# Patient Record
Sex: Female | Born: 1955 | Race: Black or African American | Hispanic: No | Marital: Single | Smoking: Never smoker
Health system: Southern US, Community
[De-identification: ages and names within clinical notes are randomized; demographics above are authoritative.]

## PROBLEM LIST (undated history)

## (undated) DIAGNOSIS — E119 Type 2 diabetes mellitus without complications: Secondary | ICD-10-CM

## (undated) DIAGNOSIS — I509 Heart failure, unspecified: Secondary | ICD-10-CM

## (undated) DIAGNOSIS — I2510 Atherosclerotic heart disease of native coronary artery without angina pectoris: Secondary | ICD-10-CM

## (undated) DIAGNOSIS — R0600 Dyspnea, unspecified: Secondary | ICD-10-CM

## (undated) DIAGNOSIS — I10 Essential (primary) hypertension: Secondary | ICD-10-CM

---

## 2014-02-01 ENCOUNTER — Encounter: Payer: Self-pay | Admitting: Internal Medicine

## 2016-04-18 ENCOUNTER — Encounter: Payer: Self-pay | Admitting: Internal Medicine

## 2016-05-21 HISTORY — PX: TOTAL KNEE ARTHROPLASTY: SHX125

## 2016-11-06 ENCOUNTER — Encounter: Payer: Self-pay | Admitting: Internal Medicine

## 2018-04-27 ENCOUNTER — Emergency Department (HOSPITAL_COMMUNITY): Payer: Self-pay

## 2018-04-27 ENCOUNTER — Encounter (HOSPITAL_COMMUNITY): Payer: Self-pay

## 2018-04-27 ENCOUNTER — Other Ambulatory Visit: Payer: Self-pay

## 2018-04-27 ENCOUNTER — Inpatient Hospital Stay (HOSPITAL_COMMUNITY)
Admission: EM | Admit: 2018-04-27 | Discharge: 2018-04-29 | DRG: 292 | Disposition: A | Discharge status: Home / Self Care | Payer: Self-pay | Attending: Internal Medicine | Admitting: Internal Medicine

## 2018-04-27 DIAGNOSIS — Z6841 Body Mass Index (BMI) 40.0 and over, adult: Secondary | ICD-10-CM

## 2018-04-27 DIAGNOSIS — I509 Heart failure, unspecified: Secondary | ICD-10-CM

## 2018-04-27 DIAGNOSIS — I5023 Acute on chronic systolic (congestive) heart failure: Secondary | ICD-10-CM | POA: Diagnosis present

## 2018-04-27 DIAGNOSIS — Z7982 Long term (current) use of aspirin: Secondary | ICD-10-CM

## 2018-04-27 DIAGNOSIS — Z9981 Dependence on supplemental oxygen: Secondary | ICD-10-CM

## 2018-04-27 DIAGNOSIS — I110 Hypertensive heart disease with heart failure: Principal | ICD-10-CM | POA: Diagnosis present

## 2018-04-27 DIAGNOSIS — Z79899 Other long term (current) drug therapy: Secondary | ICD-10-CM

## 2018-04-27 DIAGNOSIS — Z9119 Patient's noncompliance with other medical treatment and regimen: Secondary | ICD-10-CM

## 2018-04-27 DIAGNOSIS — I272 Pulmonary hypertension, unspecified: Secondary | ICD-10-CM | POA: Diagnosis present

## 2018-04-27 DIAGNOSIS — I472 Ventricular tachycardia: Secondary | ICD-10-CM | POA: Diagnosis present

## 2018-04-27 DIAGNOSIS — E119 Type 2 diabetes mellitus without complications: Secondary | ICD-10-CM | POA: Diagnosis present

## 2018-04-27 DIAGNOSIS — E876 Hypokalemia: Secondary | ICD-10-CM | POA: Diagnosis present

## 2018-04-27 HISTORY — DX: Heart failure, unspecified: I50.9

## 2018-04-27 HISTORY — DX: Type 2 diabetes mellitus without complications: E11.9

## 2018-04-27 HISTORY — DX: Essential (primary) hypertension: I10

## 2018-04-27 LAB — COMPREHENSIVE METABOLIC PANEL
ALK PHOS: 91 U/L (ref 38–126)
ALT: 59 U/L — AB (ref 0–44)
AST: 55 U/L — ABNORMAL HIGH (ref 15–41)
Albumin: 3.7 g/dL (ref 3.5–5.0)
Anion gap: 12 (ref 5–15)
BILIRUBIN TOTAL: 1 mg/dL (ref 0.3–1.2)
BUN: 21 mg/dL (ref 8–23)
CALCIUM: 9.4 mg/dL (ref 8.9–10.3)
CO2: 27 mmol/L (ref 22–32)
CREATININE: 0.97 mg/dL (ref 0.44–1.00)
Chloride: 104 mmol/L (ref 98–111)
Glucose, Bld: 193 mg/dL — ABNORMAL HIGH (ref 70–99)
Potassium: 4.2 mmol/L (ref 3.5–5.1)
Sodium: 143 mmol/L (ref 135–145)
TOTAL PROTEIN: 7.6 g/dL (ref 6.5–8.1)

## 2018-04-27 LAB — CBC
HEMATOCRIT: 37.8 % (ref 36.0–46.0)
HEMOGLOBIN: 11.6 g/dL — AB (ref 12.0–15.0)
MCH: 26.9 pg (ref 26.0–34.0)
MCHC: 30.7 g/dL (ref 30.0–36.0)
MCV: 87.7 fL (ref 80.0–100.0)
Platelets: 312 10*3/uL (ref 150–400)
RBC: 4.31 MIL/uL (ref 3.87–5.11)
RDW: 15.7 % — ABNORMAL HIGH (ref 11.5–15.5)
WBC: 7.4 10*3/uL (ref 4.0–10.5)
nRBC: 0 % (ref 0.0–0.2)

## 2018-04-27 LAB — LIPID PANEL
CHOL/HDL RATIO: 5.5 ratio
Cholesterol: 202 mg/dL — ABNORMAL HIGH (ref 0–200)
HDL: 37 mg/dL — AB (ref >40)
LDL CALC: 148 mg/dL — AB (ref 0–99)
Triglycerides: 84 mg/dL (ref <150)
VLDL: 17 mg/dL (ref 0–40)

## 2018-04-27 LAB — I-STAT TROPONIN, ED: TROPONIN I, POC: 0.02 ng/mL (ref 0.00–0.08)

## 2018-04-27 LAB — BRAIN NATRIURETIC PEPTIDE: B NATRIURETIC PEPTIDE 5: 516.6 pg/mL — AB (ref 0.0–100.0)

## 2018-04-27 LAB — TROPONIN I: TROPONIN I: 0.07 ng/mL — AB (ref <0.03)

## 2018-04-27 LAB — MAGNESIUM: MAGNESIUM: 2 mg/dL (ref 1.7–2.4)

## 2018-04-27 LAB — TSH: TSH: 1.927 u[IU]/mL (ref 0.350–4.500)

## 2018-04-27 LAB — GLUCOSE, CAPILLARY: Glucose-Capillary: 152 mg/dL — ABNORMAL HIGH (ref 70–99)

## 2018-04-27 MED ORDER — FUROSEMIDE 10 MG/ML IJ SOLN
40.0000 mg | Freq: Once | INTRAMUSCULAR | Status: AC
  Administered 2018-04-27: 40 mg via INTRAVENOUS
  Filled 2018-04-27: qty 4

## 2018-04-27 MED ORDER — ASPIRIN EC 325 MG PO TBEC
325.0000 mg | DELAYED_RELEASE_TABLET | Freq: Once | ORAL | Status: AC
  Administered 2018-04-27: 325 mg via ORAL
  Filled 2018-04-27: qty 1

## 2018-04-27 MED ORDER — ACETAMINOPHEN 325 MG PO TABS: 650.0000 mg | ORAL_TABLET | ORAL | Status: DC | PRN

## 2018-04-27 MED ORDER — NITROGLYCERIN IN D5W 200-5 MCG/ML-% IV SOLN
0.0000 ug/min | Freq: Once | INTRAVENOUS | Status: AC
  Administered 2018-04-27: 5 ug/min via INTRAVENOUS
  Filled 2018-04-27: qty 250

## 2018-04-27 MED ORDER — ONDANSETRON HCL 4 MG/2ML IJ SOLN: 4.0000 mg | Freq: Four times a day (QID) | INTRAMUSCULAR | Status: DC | PRN

## 2018-04-27 MED ORDER — ATORVASTATIN CALCIUM 40 MG PO TABS
40.0000 mg | ORAL_TABLET | Freq: Every day | ORAL | Status: DC
  Administered 2018-04-27 – 2018-04-28 (×2): 40 mg via ORAL
  Filled 2018-04-27 (×2): qty 1

## 2018-04-27 MED ORDER — ASPIRIN 81 MG PO CHEW
81.0000 mg | CHEWABLE_TABLET | Freq: Every day | ORAL | Status: DC
  Administered 2018-04-28 – 2018-04-29 (×2): 81 mg via ORAL
  Filled 2018-04-27 (×2): qty 1

## 2018-04-27 MED ORDER — ENOXAPARIN SODIUM 40 MG/0.4ML ~~LOC~~ SOLN
40.0000 mg | SUBCUTANEOUS | Status: DC
  Administered 2018-04-27: 40 mg via SUBCUTANEOUS
  Filled 2018-04-27: qty 0.4

## 2018-04-27 MED ORDER — INSULIN ASPART 100 UNIT/ML ~~LOC~~ SOLN
0.0000 [IU] | Freq: Three times a day (TID) | SUBCUTANEOUS | Status: DC
  Administered 2018-04-28: 7 [IU] via SUBCUTANEOUS
  Administered 2018-04-28: 4 [IU] via SUBCUTANEOUS

## 2018-04-27 MED ORDER — SODIUM CHLORIDE 0.9 % IV SOLN
250.0000 mL | INTRAVENOUS | Status: DC | PRN
  Administered 2018-04-28: 250 mL via INTRAVENOUS

## 2018-04-27 MED ORDER — SODIUM CHLORIDE 0.9% FLUSH
3.0000 mL | Freq: Two times a day (BID) | INTRAVENOUS | Status: DC
  Administered 2018-04-27 – 2018-04-28 (×3): 3 mL via INTRAVENOUS

## 2018-04-27 MED ORDER — ORAL CARE MOUTH RINSE
15.0000 mL | Freq: Two times a day (BID) | OROMUCOSAL | Status: DC
  Administered 2018-04-27 – 2018-04-28 (×2): 15 mL via OROMUCOSAL

## 2018-04-27 MED ORDER — SODIUM CHLORIDE 0.9% FLUSH: 3.0000 mL | INTRAVENOUS | Status: DC | PRN

## 2018-04-27 MED ORDER — LISINOPRIL 5 MG PO TABS
5.0000 mg | ORAL_TABLET | Freq: Every day | ORAL | Status: DC
  Administered 2018-04-27 – 2018-04-29 (×3): 5 mg via ORAL
  Filled 2018-04-27 (×3): qty 1

## 2018-04-27 NOTE — H&P (Addendum)
History and Physical    Tracey Gallegos IDP:[id number] DOB: 13-Jan-1956 DOA: 04/27/2018  PCP: Patient, No Pcp Per Patient coming from: Home  I have personally briefly reviewed patient's old medical records in [place]  Chief Complaint: Shortness of breath  HPI: Tracey Gallegos is a 62 y.o. female with medical history significant for hypertension and unspecified heart failure who presents to the ED with several weeks of shortness of breath and orthopnea.  Patient reports that she went to urgent care approximately 2 weeks ago, she was having exertional dyspnea, mild chest tightness and cough.  She was prescribed Augmentin without significant improvement in her symptoms.  Since that time, she has continued to have dyspnea with exertion, increasing lower extremity edema and abdominal distention, decreased urinary output and mild chest discomfort associated with exertion.  She is currently sleeping on 3 pillows, up from her usual one pillow.  She is not currently taking any medications.  She reports that she has been previously diagnosed with heart failure but was taken off of all her medications at some point in the distant past.  She denies fever, chills, night sweats.  She endorses weight gain, exertional dyspnea, chest tightness, lower extremity edema, abdominal distention.  Denies nausea, vomiting, diarrhea, dysuria.  ED Course: In the ED, patient afebrile, tachycardic into the low 100s, hypertensive, saturating in the upper 90s on 2 L submental oxygen.  Patient was given Lasix 40 mg IV with improvement in her symptoms.  Labs notable for BNP 516 (no prior for comparison) and initial troponin of 0.02.  Chest x-ray shows cardiomegaly with interstitial edema and bilateral pleural effusions.  EKG shows sinus tachycardia with a rate of 106, QTc 505, LVH criteria.  Review of Systems: As per HPI otherwise 10 point review of systems negative.   Past Medical History:  Diagnosis Date  . CHF (congestive heart  failure) ([place])   . Diabetes mellitus without complication ([place])   . Hypertension     History reviewed. No pertinent surgical history.   reports that she has never smoked. She has never used smokeless tobacco. She reports that she drinks alcohol. She reports that she does not use drugs.  No Known Allergies  No family history on file.  Prior to Admission medications   Medication Sig Start Date End Date Taking? Authorizing Provider  amoxicillin-clavulanate (AUGMENTIN) 875-125 MG tablet Take 1 tablet by mouth 2 (two) times daily.   Yes [provider]  Ascorbic Acid (VITAMIN C PO) Take by mouth.   Yes [provider]  ASPIRIN 81 PO Take 81 mg by mouth daily.   Yes [provider]  IRON PO Take by mouth.   Yes [provider]  Omega-3 Fatty Acids (FISH OIL PO) Take by mouth.   Yes [provider]    Physical Exam: Vitals:   04/27/18 1930 04/27/18 2000 04/27/18 2035 04/27/18 2036  BP: (!) 178/120 (!) 167/108 (!) 174/95   Pulse: 88 93 95   Resp: (!) 29 (!) 34 20   Temp:   98 F (36.7 C)   TempSrc:   Oral   SpO2: 96% 98% 94%   Weight:    127.7 kg  Height:    '5\' 8"'$  (1.727 m)    Constitutional: NAD, calm, comfortable Eyes: PERRL, lids and conjunctivae normal ENMT: Mucous membranes are moist. Posterior pharynx clear of any exudate or lesions. Neck: normal, supple, no masses, (+) JVP at 10 cmH2O Respiratory: decreased at the bases, inspiratory crackles in the  mid lung fields bilaterally Cardiovascular: Tachycardic, no murmurs / rubs / gallops. 3+ bilateral lower extremity edema to knees. 2+ pedal pulses. Abdomen: no tenderness, no masses palpated. Mildly distended. Bowel sounds positive.  Musculoskeletal: no clubbing / cyanosis. No joint deformity upper and lower extremities. Good ROM, no contractures. Normal muscle tone.  Skin: no rashes, lesions, ulcers. No induration Neurologic: CN 2-12 grossly intact. Sensation intact, DTR normal.  Strength 5/5 in all 4.  Psychiatric: Normal judgment and insight. Alert and oriented x 3. Normal mood.    Labs on Admission: I have personally reviewed following labs and imaging studies  CBC: Recent Labs  Lab 04/27/18 1613  WBC 7.4  HGB 11.6*  HCT 37.8  MCV 87.7  PLT 841   Basic Metabolic Panel: Recent Labs  Lab 04/27/18 1613 04/27/18 2111  NA 143  --   K 4.2  --   CL 104  --   CO2 27  --   GLUCOSE 193*  --   BUN 21  --   CREATININE 0.97  --   CALCIUM 9.4  --   MG  --  2.0   GFR: Estimated Creatinine Clearance: 83.8 mL/min (by C-G formula based on SCr of 0.97 mg/dL). Liver Function Tests: Recent Labs  Lab 04/27/18 1613  AST 55*  ALT 59*  ALKPHOS 91  BILITOT 1.0  PROT 7.6  ALBUMIN 3.7   No results for input(s): LIPASE, AMYLASE in the last 168 hours. No results for input(s): AMMONIA in the last 168 hours. Coagulation Profile: No results for input(s): INR, PROTIME in the last 168 hours. Cardiac Enzymes: Recent Labs  Lab 04/27/18 2111  TROPONINI 0.07*   BNP (last 3 results) No results for input(s): PROBNP in the last 8760 hours. HbA1C: No results for input(s): HGBA1C in the last 72 hours. CBG: Recent Labs  Lab 04/27/18 2045  GLUCAP 152*   Lipid Profile: Recent Labs    04/27/18 1513  CHOL 202*  HDL 37*  LDLCALC 148*  TRIG 84  CHOLHDL 5.5   Thyroid Function Tests: Recent Labs    04/27/18 1629  TSH 1.927   Anemia Panel: No results for input(s): VITAMINB12, FOLATE, FERRITIN, TIBC, IRON, RETICCTPCT in the last 72 hours. Urine analysis: No results found for: COLORURINE, APPEARANCEUR, LABSPEC, [place], GLUCOSEU, HGBUR, BILIRUBINUR, KETONESUR, PROTEINUR, UROBILINOGEN, NITRITE, LEUKOCYTESUR  Radiological Exams on Admission: Dg Chest Portable 1 View  Result Date: 04/27/2018 CLINICAL DATA:  Acute shortness of breath for several weeks. EXAM: PORTABLE CHEST 1 VIEW COMPARISON:  None. FINDINGS: Cardiomegaly with pulmonary vascular congestion  noted. Bilateral interstitial opacities likely represent interstitial edema. There may be trace bilateral pleural effusions present. No pneumothorax or acute bony abnormalities are noted. IMPRESSION: Cardiomegaly with interstitial opacities likely representing interstitial pulmonary edema. Possible trace bilateral pleural effusions. Electronically Signed   By: Margarette Canada M.D.   On: 04/27/2018 16:44    EKG: Independently reviewed. Sinus tachycardia, rate 106. LVH criteria met. QTc 505.  Assessment/Plan Active Problems:   Acute exacerbation of CHF (congestive heart failure) (HCC)  Meeghan Skipper is a 62 y.o. female with above medical history admitted for acute CHF exacerbation.  Suspect progression of previously noted heart failure in the setting of medication nonadherence.  Additionally, would consider long-standing, poorly controlled hypertension as a possible etiology.  If EF is newly depressed, patient would benefit from ischemic eval not emergently.  Viral myocarditis is on differential but felt to be less likely as symptoms seem to be progressive.  Acute heart failure  exacerbation, unspecified heart failure type Hypertension - Continue diuresis with IV Lasix for gross volume overload - Obtain TTE - Monitor on telemetry - Trend troponins to peak - Risk stratification labs: A1c, FLP, TSH - Start statin, ASA - Avoid BB in acute setting of heart failure - Will start lisinopril for hypertension - Daily BMP, Mg; monitor lytes in setting of diuresis - Strict I/O  DVT prophylaxis: Lovenox Code Status: Full Disposition Plan: Home in 1-2 days Consults called: None Admission status: Telemetry inpatient   Paizleigh Wilds Sharene Butters MD [facility]  If 7PM-7AM, please contact night-coverage [email or web address] Password [email or web address]  04/27/2018, 10:11 PM

## 2018-04-27 NOTE — ED Provider Notes (Signed)
[facility] DEPT Provider Note   CSN: [id number] Arrival date & time: 04/27/18  1521     History   Chief Complaint Chief Complaint  Patient presents with  . Shortness of Breath    HPI Tracey Gallegos is a 62 y.o. female with history of congestive heart failure-unknown ejection fraction, diabetes mellitus, hypertension, recently diagnosed bronchitis presenting for evaluation of shortness of breath.  For the past week 2-3 weeks, she has been having increasing dyspnea on exertion for, orthopnea, paroxysmal nocturnal dyspnea and lower extremity edema.  She also reports of  intermittent chest pressure which she attributed to bronchitis.  Currently she denies any dizziness, lightheadedness, nausea, vomiting, fevers or chills.  She has not followed with primary care doctor in over a year and currently does not take any medications.    Past Medical History:  Diagnosis Date  . CHF (congestive heart failure) ([place])   . Diabetes mellitus without complication ([place])   . Hypertension     There are no active problems to display for this patient.   History reviewed. No pertinent surgical history.   OB History   None      Home Medications    Prior to Admission medications   Medication Sig Start Date End Date Taking? Authorizing Provider  Ascorbic Acid (VITAMIN C PO) Take by mouth.   Yes [provider]  ASPIRIN 81 PO Take 81 mg by mouth daily.   Yes [provider]  IRON PO Take by mouth.   Yes [provider]  Omega-3 Fatty Acids (FISH OIL PO) Take by mouth.   Yes [provider]    Family History No family history on file.  Social History Social History   Tobacco Use  . Smoking status: Never Smoker  . Smokeless tobacco: Never Used  Substance Use Topics  . Alcohol use: Yes    Comment: occasionally  . Drug use: Never     Allergies   Patient has no known allergies.   Review of Systems Review of Systems    Constitutional: Negative.   HENT: Negative.   Eyes: Negative.   Respiratory: Positive for cough and shortness of breath.   Cardiovascular: Positive for leg swelling. Negative for palpitations. Chest pain: intermittent chest pressure   Gastrointestinal: Negative.   Musculoskeletal: Negative.   Skin: Negative.   Neurological: Negative.   Psychiatric/Behavioral: Negative.      Physical Exam Updated Vital Signs BP (!) 164/104   Pulse (!) 102   Temp 98.5 F (36.9 C) (Oral)   Resp (!) 23   Ht 5\' 8"  (1.727 m)   Wt 128.8 kg   SpO2 95%   BMI 43.18 kg/m   Physical Exam  Constitutional: She appears well-developed and well-nourished.  Non-toxic appearance. Distressed: due to SOB.  HENT:  Head: Normocephalic and atraumatic.  Cardiovascular: Normal rate and regular rhythm. Exam reveals no gallop.  No murmur heard. Pulmonary/Chest: Tachypnea noted. She is in respiratory distress. She has no decreased breath sounds. She has no wheezes. She has no rhonchi. She has rales in the right lower field and the left lower field.  Abdominal: Soft. Bowel sounds are normal.  Musculoskeletal:       Right lower leg: She exhibits edema.       Left lower leg: She exhibits edema.  Neurological: She is alert.  Skin: Skin is warm.  Psychiatric: She has a normal mood and affect. Her behavior is normal.     ED Treatments / Results  Labs (all labs ordered are listed, but only abnormal results are displayed) Labs Reviewed  CBC - Abnormal; Notable for the following components:      Result Value   Hemoglobin 11.6 (*)    RDW 15.7 (*)    All other components within normal limits  BRAIN NATRIURETIC PEPTIDE - Abnormal; Notable for the following components:   B Natriuretic Peptide 516.6 (*)    All other components within normal limits  COMPREHENSIVE METABOLIC PANEL - Abnormal; Notable for the following components:   Glucose, Bld 193 (*)    AST 55 (*)    ALT 59 (*)    All other components within normal  limits  I-STAT TROPONIN, ED  I-STAT TROPONIN, ED    EKG EKG Interpretation  Date/Time:  Tuesday April 27 2018 15:54:59 EDT Ventricular Rate:  106 PR Interval:    QRS Duration: 111 QT Interval:  380 QTC Calculation: 505 R Axis:   -26 Text Interpretation:  Sinus tachycardia Probable left ventricular hypertrophy Prolonged QT interval Confirmed by Lacretia Leigh ([id number]) on 04/27/2018 4:11:31 PM   Radiology Dg Chest Portable 1 View  Result Date: 04/27/2018 CLINICAL DATA:  Acute shortness of breath for several weeks. EXAM: PORTABLE CHEST 1 VIEW COMPARISON:  None. FINDINGS: Cardiomegaly with pulmonary vascular congestion noted. Bilateral interstitial opacities likely represent interstitial edema. There may be trace bilateral pleural effusions present. No pneumothorax or acute bony abnormalities are noted. IMPRESSION: Cardiomegaly with interstitial opacities likely representing interstitial pulmonary edema. Possible trace bilateral pleural effusions. Electronically Signed   By: Margarette Canada M.D.   On: 04/27/2018 16:44    Procedures Procedures (including critical care time)  Medications Ordered in ED Medications  nitroGLYCERIN 50 mg in dextrose 5 % 250 mL (0.2 mg/mL) infusion (5 mcg/min Intravenous Rate/Dose Verify 04/27/18 1743)  furosemide (LASIX) injection 40 mg (40 mg Intravenous Given 04/27/18 1650)     Initial Impression / Assessment and Plan / ED Course  I have reviewed the triage vital signs and the nursing notes.  Pertinent labs & imaging results that were available during my care of the patient were reviewed by me and considered in my medical decision making (see chart for details).   62 year old woman with hypertension, diabetes, congestive heart failure diagnosed in 2006 and noncompliance to clinic visits she reports of not seeing her PCP in over a year presenting with dyspnea on exertion, orthopnea, paroxysmal nocturnal dyspnea and lower extremity edema.  Physical exams  noticeable for decreased airflow, bibasilar crackles, 2+ lower extremity edema up to the ankle.  Also noted to have elevated BP at 204/110, tachycardia with heart rate of 108 and tachypnea with RR of 36.  Chest x-ray did reveal interstitial pulmonary edema with trace bilateral pleural effusions. BNP was elevated at 516, i-STAT troponin was negative.  She was given one-time dose of IV Lasix 40 mg and was started on nitroglycerin infusion for BP control.  On reevaluation she reported of slightly improved symptoms.  Her BP had also improved to 164/104.  She will be admitted to the hospitalist service for management of congestive heart failure exacerbation.   Final Clinical Impressions(s) / ED Diagnoses   Final diagnoses:  Acute on chronic congestive heart failure, unspecified heart failure type [facility])    ED Discharge Orders    None       Jean Rosenthal, MD 04/27/18 1755    Lacretia Leigh, MD 04/28/18 1346

## 2018-04-27 NOTE — ED Notes (Signed)
Bed: [facility] Expected date:  Expected time:  Means of arrival:  Comments: EMS-SOB-room 25

## 2018-04-27 NOTE — ED Notes (Signed)
ED TO INPATIENT HANDOFF REPORT  Name/Age/Gender Tracey Gallegos 62 y.o. female  Code Status   Home/SNF/Other Home  Chief Complaint short of breath   Level of Care/Admitting Diagnosis ED Disposition    ED Disposition Condition Comment   Admit  The patient appears reasonably stabilized for admission considering the current resources, flow, and capabilities available in the ED at this time, and I doubt any other [facility] requiring further screening and/or treatment in the ED prior to admission is  present.       Medical History Past Medical History:  Diagnosis Date  . CHF (congestive heart failure) ([place])   . Diabetes mellitus without complication ([place])   . Hypertension     Allergies No Known Allergies  IV Location/Drains/Wounds Patient Lines/Drains/Airways Status   Active Line/Drains/Airways    Name:   Placement date:   Placement time:   Site:   Days:   Peripheral IV 04/27/18 Left Antecubital   04/27/18    1652    Antecubital   less than 1   Peripheral IV 04/27/18 Left Forearm   04/27/18    1812    Forearm   less than 1          Labs/Imaging Results for orders placed or performed during the hospital encounter of 04/27/18 (from the past 48 hour(s))  CBC     Status: Abnormal   Collection Time: 04/27/18  4:13 PM  Result Value Ref Range   WBC 7.4 4.0 - 10.5 K/uL   RBC 4.31 3.87 - 5.11 MIL/uL   Hemoglobin 11.6 (L) 12.0 - 15.0 g/dL   HCT 37.8 36.0 - 46.0 %   MCV 87.7 80.0 - 100.0 fL   MCH 26.9 26.0 - 34.0 pg   MCHC 30.7 30.0 - 36.0 g/dL   RDW 15.7 (H) 11.5 - 15.5 %   Platelets 312 150 - 400 K/uL   nRBC 0.0 0.0 - 0.2 %    Comment: Performed at [facility], [place] [address], [place], [place] [address]  Brain natriuretic peptide     Status: Abnormal   Collection Time: 04/27/18  4:13 PM  Result Value Ref Range   B Natriuretic Peptide 516.6 (H) 0.0 - 100.0 pg/mL    Comment: Performed at [facility], [place] [address] [place], [place]  [address]  Comprehensive metabolic panel     Status: Abnormal   Collection Time: 04/27/18  4:13 PM  Result Value Ref Range   Sodium 143 135 - 145 mmol/L   Potassium 4.2 3.5 - 5.1 mmol/L   Chloride 104 98 - 111 mmol/L   CO2 27 22 - 32 mmol/L   Glucose, Bld 193 (H) 70 - 99 mg/dL   BUN 21 8 - 23 mg/dL   Creatinine, Ser 0.97 0.44 - 1.00 mg/dL   Calcium 9.4 8.9 - 10.3 mg/dL   Total Protein 7.6 6.5 - 8.1 g/dL   Albumin 3.7 3.5 - 5.0 g/dL   AST 55 (H) 15 - 41 U/L   ALT 59 (H) 0 - 44 U/L   Alkaline Phosphatase 91 38 - 126 U/L   Total Bilirubin 1.0 0.3 - 1.2 mg/dL   GFR calc non Af Amer >60 >60 mL/min   GFR calc Af Amer >60 >60 mL/min    Comment: (NOTE) The eGFR has been calculated using the CKD EPI equation. This calculation has not been validated in all clinical situations. eGFR's persistently <60 mL/min signify possible Chronic Kidney Disease.    Anion gap  12 5 - 15    Comment: Performed at [facility], [place] [address], [place], [place] [address]  I-Stat Troponin, ED (not at [facility])     Status: None   Collection Time: 04/27/18  4:18 PM  Result Value Ref Range   Troponin i, poc 0.02 0.00 - 0.08 ng/mL   Comment 3            Comment: Due to the release kinetics of cTnI, a negative result within the first hours of the onset of symptoms does not rule out myocardial infarction with certainty. If myocardial infarction is still suspected, repeat the test at appropriate intervals.    Dg Chest Portable 1 View  Result Date: 04/27/2018 CLINICAL DATA:  Acute shortness of breath for several weeks. EXAM: PORTABLE CHEST 1 VIEW COMPARISON:  None. FINDINGS: Cardiomegaly with pulmonary vascular congestion noted. Bilateral interstitial opacities likely represent interstitial edema. There may be trace bilateral pleural effusions present. No pneumothorax or acute bony abnormalities are noted. IMPRESSION: Cardiomegaly with interstitial opacities likely representing interstitial pulmonary  edema. Possible trace bilateral pleural effusions. Electronically Signed   By: Margarette Canada M.D.   On: 04/27/2018 16:44    Pending Labs Unresulted Labs (From admission, onward)   None      Vitals/Pain Today's Vitals   04/27/18 1630 04/27/18 1705 04/27/18 1741 04/27/18 1800  BP: (!) 174/111 (!) 158/98 (!) 164/104 (!) 182/140  Pulse: (!) 102 96 (!) 102 98  Resp: (!) 33 (!) 32 (!) 23 18  Temp:      TempSrc:      SpO2: 93% 96% 95% 94%  Weight:      Height:      PainSc:        Isolation Precautions No active isolations  Medications Medications  nitroGLYCERIN 50 mg in dextrose 5 % 250 mL (0.2 mg/mL) infusion (10 mcg/min Intravenous Rate/Dose Verify 04/27/18 1815)  furosemide (LASIX) injection 40 mg (40 mg Intravenous Given 04/27/18 1650)    Mobility walks

## 2018-04-27 NOTE — Progress Notes (Signed)
CRITICAL VALUE ALERT  Critical Value:  Troponin 0.07  Date & Time Notied:  04/27/18 2200  Provider Notified:Kirby, NP  Orders Received/Actions taken:

## 2018-04-27 NOTE — ED Provider Notes (Addendum)
I saw and evaluated the patient, reviewed the resident's note and I agree with the findings and plan.  EKG: EKG Interpretation  Date/Time:  Tuesday April 27 2018 15:54:59 EDT Ventricular Rate:  106 PR Interval:    QRS Duration: 111 QT Interval:  380 QTC Calculation: 505 R Axis:   -26 Text Interpretation:  Sinus tachycardia Probable left ventricular hypertrophy Prolonged QT interval Confirmed by Lacretia Leigh ([id number]) on 04/27/2018 4:34:54 PM 62 year old female here with shortness of breath times several weeks.  She endorses symptoms consistent with CHF.  On exam she does have rales in her bases.  Chest x-ray pending at this time.  Patient likely to require admission  Pt improved after iv lasix and nitro drip  Will be admitted to the medicine service  CRITICAL CARE Performed by: Leota Jacobsen Total critical care time: 55 minutes Critical care time was exclusive of separately billable procedures and treating other patients. Critical care was necessary to treat or prevent imminent or life-threatening deterioration. Critical care was time spent personally by me on the following activities: development of treatment plan with patient and/or surrogate as well as nursing, discussions with consultants, evaluation of patient's response to treatment, examination of patient, obtaining history from patient or surrogate, ordering and performing treatments and interventions, ordering and review of laboratory studies, ordering and review of radiographic studies, pulse oximetry and re-evaluation of patient's condition.    Lacretia Leigh, MD 04/27/18 1631    Lacretia Leigh, MD 05/13/18 1240

## 2018-04-27 NOTE — ED Triage Notes (Signed)
Pt BIBA from home. Pt c/o shortness of breath for several weeks. Pt was dx with bronchitis 2 weeks ago. She took 10 days of abx without relief. At her PCP, the MD noted sats in the upper 80s/lower 90s.

## 2018-04-28 ENCOUNTER — Inpatient Hospital Stay (HOSPITAL_COMMUNITY): Payer: Self-pay

## 2018-04-28 ENCOUNTER — Encounter (HOSPITAL_COMMUNITY): Payer: Self-pay

## 2018-04-28 DIAGNOSIS — I5021 Acute systolic (congestive) heart failure: Secondary | ICD-10-CM

## 2018-04-28 DIAGNOSIS — I509 Heart failure, unspecified: Secondary | ICD-10-CM

## 2018-04-28 DIAGNOSIS — I340 Nonrheumatic mitral (valve) insufficiency: Secondary | ICD-10-CM

## 2018-04-28 LAB — BASIC METABOLIC PANEL
ANION GAP: 13 (ref 5–15)
BUN: 18 mg/dL (ref 8–23)
CALCIUM: 9.4 mg/dL (ref 8.9–10.3)
CHLORIDE: 104 mmol/L (ref 98–111)
CO2: 31 mmol/L (ref 22–32)
Creatinine, Ser: 1.1 mg/dL — ABNORMAL HIGH (ref 0.44–1.00)
GFR calc Af Amer: >60 mL/min (ref >60)
GFR calc non Af Amer: 52 mL/min — ABNORMAL LOW (ref >60)
GLUCOSE: 179 mg/dL — AB (ref 70–99)
Potassium: 3.6 mmol/L (ref 3.5–5.1)
Sodium: 148 mmol/L — ABNORMAL HIGH (ref 135–145)

## 2018-04-28 LAB — HEMOGLOBIN A1C
Hgb A1c MFr Bld: 7.8 % — ABNORMAL HIGH (ref 4.8–5.6)
Mean Plasma Glucose: 177.16 mg/dL

## 2018-04-28 LAB — TROPONIN I: TROPONIN I: 0.06 ng/mL — AB (ref <0.03)

## 2018-04-28 LAB — ECHOCARDIOGRAM COMPLETE
HEIGHTINCHES: 68 in
Weight: 4438.4 oz

## 2018-04-28 LAB — GLUCOSE, CAPILLARY
GLUCOSE-CAPILLARY: 131 mg/dL — AB (ref 70–99)
Glucose-Capillary: 122 mg/dL — ABNORMAL HIGH (ref 70–99)
Glucose-Capillary: 161 mg/dL — ABNORMAL HIGH (ref 70–99)
Glucose-Capillary: 214 mg/dL — ABNORMAL HIGH (ref 70–99)

## 2018-04-28 LAB — HIV ANTIBODY (ROUTINE TESTING W REFLEX): HIV Screen 4th Generation wRfx: NONREACTIVE

## 2018-04-28 MED ORDER — ENOXAPARIN SODIUM 60 MG/0.6ML ~~LOC~~ SOLN
60.0000 mg | SUBCUTANEOUS | Status: DC
  Administered 2018-04-28: 60 mg via SUBCUTANEOUS
  Filled 2018-04-28: qty 0.6

## 2018-04-28 MED ORDER — IPRATROPIUM-ALBUTEROL 0.5-2.5 (3) MG/3ML IN SOLN
3.0000 mL | Freq: Once | RESPIRATORY_TRACT | Status: AC
  Administered 2018-04-28: 3 mL via RESPIRATORY_TRACT
  Filled 2018-04-28: qty 3

## 2018-04-28 MED ORDER — POTASSIUM CHLORIDE CRYS ER 20 MEQ PO TBCR
40.0000 meq | EXTENDED_RELEASE_TABLET | Freq: Once | ORAL | Status: AC
  Administered 2018-04-28: 40 meq via ORAL
  Filled 2018-04-28: qty 2

## 2018-04-28 MED ORDER — IPRATROPIUM-ALBUTEROL 0.5-2.5 (3) MG/3ML IN SOLN: 3.0000 mL | Freq: Four times a day (QID) | RESPIRATORY_TRACT | Status: DC

## 2018-04-28 MED ORDER — GLIPIZIDE 5 MG PO TABS
2.5000 mg | ORAL_TABLET | Freq: Every day | ORAL | Status: DC
  Administered 2018-04-29: 2.5 mg via ORAL
  Filled 2018-04-28: qty 1

## 2018-04-28 MED ORDER — FUROSEMIDE 10 MG/ML IJ SOLN
40.0000 mg | Freq: Two times a day (BID) | INTRAMUSCULAR | Status: DC
  Administered 2018-04-28 – 2018-04-29 (×3): 40 mg via INTRAVENOUS
  Filled 2018-04-28 (×3): qty 4

## 2018-04-28 MED ORDER — METOPROLOL SUCCINATE ER 25 MG PO TB24
25.0000 mg | ORAL_TABLET | Freq: Every day | ORAL | Status: DC
  Administered 2018-04-28 – 2018-04-29 (×2): 25 mg via ORAL
  Filled 2018-04-28 (×2): qty 1

## 2018-04-28 MED ORDER — HYDRALAZINE HCL 20 MG/ML IJ SOLN: 10.0000 mg | INTRAMUSCULAR | Status: DC | PRN

## 2018-04-28 MED ORDER — MAGNESIUM SULFATE 2 GM/50ML IV SOLN
2.0000 g | Freq: Once | INTRAVENOUS | Status: AC
  Administered 2018-04-28: 2 g via INTRAVENOUS
  Filled 2018-04-28: qty 50

## 2018-04-28 MED ORDER — POTASSIUM CHLORIDE CRYS ER 20 MEQ PO TBCR
20.0000 meq | EXTENDED_RELEASE_TABLET | Freq: Two times a day (BID) | ORAL | Status: DC
  Administered 2018-04-28 – 2018-04-29 (×3): 20 meq via ORAL
  Filled 2018-04-28 (×3): qty 1

## 2018-04-28 NOTE — Progress Notes (Signed)
Patient ambulated 360 feet in the hallways without oxygen. The oxygen saturation was 91-96 %. The patient was dyspneic and had an audible wheeze at the end of ambulating in hallways. The patient also had a non-productive cough.  The PCP was notified of the cough and the wheezing. Awaiting any new orders.

## 2018-04-28 NOTE — Progress Notes (Addendum)
PROGRESS NOTE    Tracey Gallegos  ERD:[id number] DOB: 30-May-1956 DOA: 04/27/2018 PCP: Patient, No Pcp Per  Brief [place] y.o. female with medical history significant for hypertension and unspecified heart failure who presents to the ED with several weeks of shortness of breath and orthopnea.  Patient reports that she went to urgent care approximately 2 weeks ago, she was having exertional dyspnea, mild chest tightness and cough.  She was prescribed Augmentin without significant improvement in her symptoms.  Since that time, she has continued to have dyspnea with exertion, increasing lower extremity edema and abdominal distention, decreased urinary output and mild chest discomfort associated with exertion.  She is currently sleeping on 3 pillows, up from her usual one pillow.  She is not currently taking any medications.  She reports that she has been previously diagnosed with heart failure but was taken off of all her medications at some point in the distant past.  She denies fever, chills, night sweats.  She endorses weight gain, exertional dyspnea, chest tightness, lower extremity edema, abdominal distention.  Denies nausea, vomiting, diarrhea, dysuria.  ED Course: In the ED, patient afebrile, tachycardic into the low 100s, hypertensive, saturating in the upper 90s on 2 L submental oxygen.  Patient was given Lasix 40 mg IV with improvement in her symptoms.  Labs notable for BNP 516 (no prior for comparison) and initial troponin of 0.02.  Chest x-ray shows cardiomegaly with interstitial edema and bilateral pleural effusions.  EKG shows sinus tachycardia with a rate of 106, QTc 505, LVH criteria.  Assessment & Plan:   Active Problems:   Acute exacerbation of CHF (congestive heart failure) ([place])   Acute heart failure ([place])   #1 acute CHF exacerbation-patient reports that she does not take her medications at home nor has a primary care physician.  Year ago she moved from [place] to [place].   And she reports her last hospital admission for the same was in 2006.  Denies any chest pain at this time.  Patient received Lasix overnight and feels her breathing is better than before.  However she is still oxygen dependent with crackles and bilateral lower extremity edema.  Echocardiogram pending.  We will arrange her to follow-up at community health and wellness upon discharge.  Continue aspirin and statin.  Continue ACE inhibitor.  Monitor renal functions.  Mildly elevated troponin peaked at 0.07 with no acute EKG changes.  She is negative by 2 L.  Patient had 8 beats of V. tach this morning.  She was symptomatic with shortness of breath.  Repleted magnesium keep potassium above 4 beta-blocker added.  She is at high risk of cardiac decompensation with arrhythmias heart failure and cardiac arrest.  #2 type 2 diabetes patient has a diagnosis of diabetes.  Hemoglobin A A1c 7.8.  Patient reports she has not taken any of her diabetic medications for over 6 months.  #3 hypertension added metoprolol continue lisinopril and Lasix 40 mg IV twice a day.  DVT prophylaxis: Lovenox Code Status: Full code Family Communication: No family available Disposition Plan: Pending clinical improvement   Consultants: None  Procedures: None Antimicrobials: None Subjective: Feels better compared to 2 days ago with Lasix.  Still complains of lower extremity edema dyspnea on exertion and is oxygen dependent.  Objective: Vitals:   04/27/18 2035 04/27/18 2036 04/28/18 0457 04/28/18 0910  BP: (!) 174/95  (!) 136/95   Pulse: 95  81   Resp: 20  18   Temp: 98 F (36.7 C)  98.5 F (36.9 C)   TempSrc: Oral  Oral   SpO2: 94%  98% 97%  Weight:  127.7 kg 125.8 kg   Height:  5\' 8"  (1.727 m)      Intake/Output Summary (Last 24 hours) at 04/28/2018 1308 Last data filed at 04/28/2018 1200 Gross per 24 hour  Intake 7.66 ml  Output 2400 ml  Net -2392.34 ml   Filed Weights   04/27/18 1551 04/27/18 2036 04/28/18 0457   Weight: 128.8 kg 127.7 kg 125.8 kg    Examination:  General exam: Appears calm and comfortable  Respiratory system: Clear to auscultation. Respiratory effort normal. Cardiovascular system: S1 & S2 heard, RRR. No JVD, murmurs, rubs, gallops or clicks. No pedal edema. Gastrointestinal system: Abdomen is nondistended, soft and nontender. No organomegaly or masses felt. Normal bowel sounds heard. Central nervous system: Alert and oriented. No focal neurological deficits. Extremities: Symmetric 5 x 5 power. Skin: No rashes, lesions or ulcers Psychiatry: Judgement and insight appear normal. Mood & affect appropriate.     Data Reviewed: I have personally reviewed following labs and imaging studies  CBC: Recent Labs  Lab 04/27/18 1613  WBC 7.4  HGB 11.6*  HCT 37.8  MCV 87.7  PLT 081   Basic Metabolic Panel: Recent Labs  Lab 04/27/18 1613 04/27/18 2111 04/28/18 0226  NA 143  --  148*  K 4.2  --  3.6  CL 104  --  104  CO2 27  --  31  GLUCOSE 193*  --  179*  BUN 21  --  18  CREATININE 0.97  --  1.10*  CALCIUM 9.4  --  9.4  MG  --  2.0  --    GFR: Estimated Creatinine Clearance: 73.3 mL/min (A) (by C-G formula based on SCr of 1.1 mg/dL (H)). Liver Function Tests: Recent Labs  Lab 04/27/18 1613  AST 55*  ALT 59*  ALKPHOS 91  BILITOT 1.0  PROT 7.6  ALBUMIN 3.7   No results for input(s): LIPASE, AMYLASE in the last 168 hours. No results for input(s): AMMONIA in the last 168 hours. Coagulation Profile: No results for input(s): INR, PROTIME in the last 168 hours. Cardiac Enzymes: Recent Labs  Lab 04/27/18 2111 04/28/18 0226  TROPONINI 0.07* 0.06*   BNP (last 3 results) No results for input(s): PROBNP in the last 8760 hours. HbA1C: Recent Labs    04/27/18 2111  HGBA1C 7.8*   CBG: Recent Labs  Lab 04/27/18 2045 04/28/18 0731 04/28/18 1151  GLUCAP 152* 122* 161*   Lipid Profile: Recent Labs    04/27/18 1513  CHOL 202*  HDL 37*  LDLCALC 148*    TRIG 84  CHOLHDL 5.5   Thyroid Function Tests: Recent Labs    04/27/18 1629  TSH 1.927   Anemia Panel: No results for input(s): VITAMINB12, FOLATE, FERRITIN, TIBC, IRON, RETICCTPCT in the last 72 hours. Sepsis Labs: No results for input(s): PROCALCITON, LATICACIDVEN in the last 168 hours.  No results found for this or any previous visit (from the past 240 hour(s)).       Radiology Studies: Dg Chest Portable 1 View  Result Date: 04/27/2018 CLINICAL DATA:  Acute shortness of breath for several weeks. EXAM: PORTABLE CHEST 1 VIEW COMPARISON:  None. FINDINGS: Cardiomegaly with pulmonary vascular congestion noted. Bilateral interstitial opacities likely represent interstitial edema. There may be trace bilateral pleural effusions present. No pneumothorax or acute bony abnormalities are noted. IMPRESSION: Cardiomegaly with interstitial opacities likely representing interstitial pulmonary edema. Possible trace  bilateral pleural effusions. Electronically Signed   By: Margarette Canada M.D.   On: 04/27/2018 16:44        Scheduled Meds: . aspirin  81 mg Oral Daily  . atorvastatin  40 mg Oral q1800  . enoxaparin (LOVENOX) injection  60 mg Subcutaneous Q24H  . furosemide  40 mg Intravenous BID  . insulin aspart  0-20 Units Subcutaneous TID WC  . lisinopril  5 mg Oral Daily  . mouth rinse  15 mL Mouth Rinse BID  . metoprolol succinate  25 mg Oral Daily  . potassium chloride  20 mEq Oral BID  . sodium chloride flush  3 mL Intravenous Q12H   Continuous Infusions: . sodium chloride       LOS: 0 days     Georgette Shell, MD [facility] If 7PM-7AM, please contact night-coverage [email or web address] Password [email or web address] 04/28/2018, 1:08 PM

## 2018-04-28 NOTE — Progress Notes (Signed)
Pharmacy [place] dose adjusted from 40 to 60 mg sq q24 for BMI = 42  Eudelia Bunch, Pharm.D [phone] 04/28/2018 8:32 AM

## 2018-04-28 NOTE — Progress Notes (Signed)
Notified by CCMD that pt had an 8 beat run of V Tach. Pt currently in SR with a rate of 82. NP on call, Baltazar Najjar, notified. Will continue to monitor.

## 2018-04-28 NOTE — Progress Notes (Signed)
Attempted to perform echo at 2pm.  Patient did not want to do the echo at that time because they wanted to eat their upcoming meal.  Per conversation with nurse, will attempt at a later time.

## 2018-04-28 NOTE — Progress Notes (Signed)
  Echocardiogram 2D Echocardiogram has been performed.  Jannett Celestine 04/28/2018, 4:06 PM

## 2018-04-28 NOTE — Progress Notes (Addendum)
CMT informed RN that the patient had a run of 7 aberrant beats,pt. Is currently getting a Magnesium Sulfate infusion.  PCP was notified

## 2018-04-29 LAB — BASIC METABOLIC PANEL
ANION GAP: 9 (ref 5–15)
BUN: 23 mg/dL (ref 8–23)
CHLORIDE: 102 mmol/L (ref 98–111)
CO2: 32 mmol/L (ref 22–32)
CREATININE: 1.05 mg/dL — AB (ref 0.44–1.00)
Calcium: 9 mg/dL (ref 8.9–10.3)
GFR calc non Af Amer: 55 mL/min — ABNORMAL LOW (ref >60)
Glucose, Bld: 115 mg/dL — ABNORMAL HIGH (ref 70–99)
Potassium: 3.9 mmol/L (ref 3.5–5.1)
SODIUM: 143 mmol/L (ref 135–145)

## 2018-04-29 LAB — GLUCOSE, CAPILLARY
GLUCOSE-CAPILLARY: 115 mg/dL — AB (ref 70–99)
Glucose-Capillary: 109 mg/dL — ABNORMAL HIGH (ref 70–99)

## 2018-04-29 LAB — MAGNESIUM: MAGNESIUM: 2 mg/dL (ref 1.7–2.4)

## 2018-04-29 MED ORDER — ATORVASTATIN CALCIUM 40 MG PO TABS: 40.0000 mg | ORAL_TABLET | Freq: Every day | ORAL | 0 refills | Status: DC

## 2018-04-29 MED ORDER — METOPROLOL SUCCINATE ER 25 MG PO TB24: 25.0000 mg | ORAL_TABLET | Freq: Every day | ORAL | 0 refills | Status: DC

## 2018-04-29 MED ORDER — POTASSIUM CHLORIDE ER 10 MEQ PO TBCR: 10.0000 meq | EXTENDED_RELEASE_TABLET | Freq: Two times a day (BID) | ORAL | 0 refills | Status: DC

## 2018-04-29 MED ORDER — LISINOPRIL 5 MG PO TABS: 5.0000 mg | ORAL_TABLET | Freq: Every day | ORAL | 0 refills | Status: DC

## 2018-04-29 MED ORDER — FUROSEMIDE 40 MG PO TABS: 40.0000 mg | ORAL_TABLET | Freq: Two times a day (BID) | ORAL | 11 refills | Status: DC

## 2018-04-29 MED ORDER — GLIPIZIDE 5 MG PO TABS: 2.5000 mg | ORAL_TABLET | Freq: Every day | ORAL | 0 refills | Status: DC

## 2018-04-29 NOTE — Discharge Summary (Addendum)
Physician Discharge Summary  Tracey Gallegos ZHG:[id number] DOB: Aug 28, 1955 DOA: 04/27/2018  PCP: Patient, No Pcp Per  Admit date: 04/27/2018 Discharge date: 04/29/2018  Admitted From:home Disposition:  home  Recommendations for Outpatient Follow-up:  1. Follow up with PCP in 1-2 weeks 2. Please obtain BMP/CBC in one week  Home Health: none Equipment/Devices none  Discharge Condition:stable CODE STATUS:full Diet recommendation cardiac Brief/Interim Summary:62 y.o.femalewith medical history significant forhypertension and unspecified heart failure who presents to the ED with several weeks of shortness of breath and orthopnea. Patient reports that she went to urgent care approximately 2 weeks ago, she was having exertional dyspnea, mild chest tightness and cough. She was prescribed Augmentin without significant improvement in her symptoms. Since that time, she has continued to have dyspnea with exertion, increasing lower extremity edema and abdominal distention, decreased urinary output and mild chest discomfort associated with exertion. She is currently sleeping on 3 pillows, up from her usual one pillow. She is not currently taking any medications. She reports that she has been previously diagnosed with heart failure but was taken off of all her medications at some point in the distant past. She denies fever, chills, night sweats. She endorses weight gain, exertional dyspnea, chest tightness, lower extremity edema, abdominal distention. Denies nausea, vomiting, diarrhea, dysuria.  ED Course:In the ED, patient afebrile, tachycardic into the low 100s, hypertensive, saturating in the upper 90s on 2 L submental oxygen. Patient was given Lasix 40 mg IV with improvement in her symptoms. Labs notable for BNP 516 (no prior for comparison) and initial troponin of 0.02. Chest x-ray shows cardiomegaly with interstitial edema and bilateral pleural effusions. EKG shows sinus tachycardia with a  rate of 106, QTc 505, LVH criteria   Discharge Diagnoses:  Active Problems:   Acute exacerbation of CHF (congestive heart failure) ([place])   Acute heart failure ([place])   #1 acute systolic CHF exacerbation-patient reports that she does not take her medications at home nor has a primary care physician.  Year ago she moved from [place] to [place].  And she reports her last hospital admission for the same was in 2006.  Denies any chest pain at this time.  Patient received Lasix overnight and feels her breathing is better than before.she is on room air saturation above 94 percent.  .  Echocardiogram   Shows -- ef 25 to 30 % .moderated pulmonary htn.We will arrange her to follow-up at community health and cardiology  wellness upon discharge.  Continue aspirin and statin.  Continue ACE inhibitor.  Monitor renal function after dc.  #2 type 2 diabetes patient has a diagnosis of diabetes.  Hemoglobin A A1c 7.8.  Patient reports she has not taken any of her diabetic medications for over 6 months.glipizide started.  #3 hypertension added metoprolol continue lisinopril and Lasix 40 mg IV twice a day.  #4 hypokalemia/hypomagnesemia resolved  Discharge Instructions  Discharge Instructions    Call MD for:  difficulty breathing, headache or visual disturbances   Complete by:  As directed    Call MD for:  persistant dizziness or light-headedness   Complete by:  As directed    Call MD for:  severe uncontrolled pain   Complete by:  As directed    Diet - low sodium heart healthy   Complete by:  As directed    Increase activity slowly   Complete by:  As directed      Allergies as of 04/29/2018   No Known Allergies     Medication List  STOP taking these medications   amoxicillin-clavulanate 875-125 MG tablet Commonly known as:  AUGMENTIN   IRON PO   VITAMIN C PO     TAKE these medications   ASPIRIN 81 PO Take 81 mg by mouth daily.   atorvastatin 40 MG tablet Commonly known as:   LIPITOR Take 1 tablet (40 mg total) by mouth daily at 6 PM.   FISH OIL PO Take by mouth.   furosemide 40 MG tablet Commonly known as:  LASIX Take 1 tablet (40 mg total) by mouth 2 (two) times daily.   glipiZIDE 5 MG tablet Commonly known as:  GLUCOTROL Take 0.5 tablets (2.5 mg total) by mouth daily before breakfast. Start taking on:  04/30/2018   lisinopril 5 MG tablet Commonly known as:  PRINIVIL,ZESTRIL Take 1 tablet (5 mg total) by mouth daily.   metoprolol succinate 25 MG 24 hr tablet Commonly known as:  TOPROL-XL Take 1 tablet (25 mg total) by mouth daily.   potassium chloride 10 MEQ tablet Commonly known as:  K-DUR Take 1 tablet (10 mEq total) by mouth 2 (two) times daily.      Follow-up [facility] up.   Contact information: [place] [address] [phone]         No Known Allergies  Consultations:  none   Procedures/Studies: Dg Chest Portable 1 View  Result Date: 04/27/2018 CLINICAL DATA:  Acute shortness of breath for several weeks. EXAM: PORTABLE CHEST 1 VIEW COMPARISON:  None. FINDINGS: Cardiomegaly with pulmonary vascular congestion noted. Bilateral interstitial opacities likely represent interstitial edema. There may be trace bilateral pleural effusions present. No pneumothorax or acute bony abnormalities are noted. IMPRESSION: Cardiomegaly with interstitial opacities likely representing interstitial pulmonary edema. Possible trace bilateral pleural effusions. Electronically Signed   By: Margarette Canada M.D.   On: 04/27/2018 16:44    (Echo, Carotid, EGD, Colonoscopy, ERCP)    Subjective:   Discharge Exam: Vitals:   04/28/18 2335 04/29/18 0457  BP: (!) 120/91 (!) 122/97  Pulse: 78 79  Resp: 18 18  Temp: 98.5 F (36.9 C) (!) 97.5 F (36.4 C)  SpO2: 98% 95%   Vitals:   04/28/18 2151 04/28/18 2208 04/28/18 2335 04/29/18 0457  BP: (!) 131/91  (!) 120/91  (!) 122/97  Pulse: 79  78 79  Resp: 18  18 18   Temp: 98.6 F (37 C)  98.5 F (36.9 C) (!) 97.5 F (36.4 C)  TempSrc:   Oral Oral  SpO2: 99% 98% 98% 95%  Weight:    125.4 kg  Height:        General: Pt is alert, awake, not in acute distress Cardiovascular: RRR, S1/S2 +, no rubs, no gallops Respiratory: CTA bilaterally, no wheezing, no rhonchi Abdominal: Soft, NT, ND, bowel sounds + Extremities: no edema, no cyanosis    The results of significant diagnostics from this hospitalization (including imaging, microbiology, ancillary and laboratory) are listed below for reference.     Microbiology: No results found for this or any previous visit (from the past 240 hour(s)).   Labs: BNP (last 3 results) Recent Labs    04/27/18 1613  BNP 992.4*   Basic Metabolic Panel: Recent Labs  Lab 04/27/18 1613 04/27/18 2111 04/28/18 0226 04/29/18 0435  NA 143  --  148* 143  K 4.2  --  3.6 3.9  CL 104  --  104 102  CO2 27  --  31 32  GLUCOSE 193*  --  179* 115*  BUN 21  --  18 23  CREATININE 0.97  --  1.10* 1.05*  CALCIUM 9.4  --  9.4 9.0  MG  --  2.0  --  2.0   Liver Function Tests: Recent Labs  Lab 04/27/18 1613  AST 55*  ALT 59*  ALKPHOS 91  BILITOT 1.0  PROT 7.6  ALBUMIN 3.7   No results for input(s): LIPASE, AMYLASE in the last 168 hours. No results for input(s): AMMONIA in the last 168 hours. CBC: Recent Labs  Lab 04/27/18 1613  WBC 7.4  HGB 11.6*  HCT 37.8  MCV 87.7  PLT 312   Cardiac Enzymes: Recent Labs  Lab 04/27/18 2111 04/28/18 0226  TROPONINI 0.07* 0.06*   BNP: Invalid input(s): POCBNP CBG: Recent Labs  Lab 04/28/18 0731 04/28/18 1151 04/28/18 1638 04/28/18 2155 04/29/18 0747  GLUCAP 122* 161* 214* 131* 109*   D-Dimer No results for input(s): DDIMER in the last 72 hours. Hgb A1c Recent Labs    04/27/18 2111  HGBA1C 7.8*   Lipid Profile Recent Labs    04/27/18 1513  CHOL 202*  HDL 37*  LDLCALC 148*  TRIG 84  CHOLHDL 5.5    Thyroid function studies Recent Labs    04/27/18 1629  TSH 1.927   Anemia work up No results for input(s): VITAMINB12, FOLATE, FERRITIN, TIBC, IRON, RETICCTPCT in the last 72 hours. Urinalysis No results found for: COLORURINE, APPEARANCEUR, LABSPEC, [place], GLUCOSEU, HGBUR, BILIRUBINUR, KETONESUR, PROTEINUR, UROBILINOGEN, NITRITE, LEUKOCYTESUR Sepsis Labs Invalid input(s): PROCALCITONIN,  WBC,  LACTICIDVEN Microbiology No results found for this or any previous visit (from the past 240 hour(s)).   Time coordinating discharge: 34 minutes  SIGNED:   Georgette Shell, MD  [facility] 04/29/2018, 8:36 AM Pager   If 7PM-7AM, please contact night-coverage [email or web address] Password [email or web address]

## 2018-05-25 ENCOUNTER — Ambulatory Visit (INDEPENDENT_AMBULATORY_CARE_PROVIDER_SITE_OTHER): Payer: Self-pay | Admitting: Internal Medicine

## 2018-05-25 ENCOUNTER — Encounter: Payer: Self-pay | Admitting: Internal Medicine

## 2018-05-25 VITALS — BP 148/100 | HR 97 | Ht 68.0 in | Wt 268.0 lb

## 2018-05-25 DIAGNOSIS — I10 Essential (primary) hypertension: Secondary | ICD-10-CM

## 2018-05-25 DIAGNOSIS — I5043 Acute on chronic combined systolic (congestive) and diastolic (congestive) heart failure: Secondary | ICD-10-CM

## 2018-05-25 DIAGNOSIS — E109 Type 1 diabetes mellitus without complications: Secondary | ICD-10-CM

## 2018-05-25 MED ORDER — ATORVASTATIN CALCIUM 40 MG PO TABS: 40.0000 mg | ORAL_TABLET | Freq: Every day | ORAL | 11 refills | Status: DC

## 2018-05-25 MED ORDER — METOPROLOL SUCCINATE ER 25 MG PO TB24: 25.0000 mg | ORAL_TABLET | Freq: Every day | ORAL | 11 refills | Status: DC

## 2018-05-25 MED ORDER — LOSARTAN POTASSIUM 50 MG PO TABS: 50.0000 mg | ORAL_TABLET | Freq: Every day | ORAL | 3 refills | Status: DC

## 2018-05-25 MED ORDER — GLIPIZIDE 5 MG PO TABS: 2.5000 mg | ORAL_TABLET | Freq: Every day | ORAL | 0 refills | Status: DC

## 2018-05-25 MED ORDER — POTASSIUM CHLORIDE ER 10 MEQ PO TBCR: 10.0000 meq | EXTENDED_RELEASE_TABLET | Freq: Two times a day (BID) | ORAL | 11 refills | Status: DC

## 2018-05-25 NOTE — Progress Notes (Signed)
OFFICE NOTE  Chief Complaint:  Establish cardiologist  Primary Care Physician: Patient, No Pcp Per  HPI:  Tracey Gallegos is a 62 y.o. female with a past medial history significant for congestive heart failure with a diagnosis of acute systolic congestive heart failure in 2006 in [place].  The details are not totally clear however she said she was living in [place] at the time.  She may have had work-up in [facility].  We will try to obtain records from her primary care provider.  She said that she was treated with diuretics and that she did have a stress test which is apparently negative for ischemia.  Subsequently she has been noncompliant with medications and is moved around, ultimately recently moving to [place].  She was admitted for acute on chronic systolic congestive heart failure.  An echo showed EF of 25 to 30% with severe global hypokinesis and grade 3 diastolic dysfunction.  There was mild mitral regurgitation, severe left atrial enlargement and moderate pulmonary hypertension with an RVSP of 66 mmHg.  She was diuresed and is down to about 5 kg since discharge.  He reports persistent, dry cough.  She is on lisinopril but says she is taken in the past.  She denies any chest pain.  She does not have a local primary care provider but was encouraged to establish with community health and wellness.  PMHx:  Past Medical History:  Diagnosis Date  . CHF (congestive heart failure) ([place])   . Diabetes mellitus without complication ([place])   . Hypertension     History reviewed. No pertinent surgical history.  FAMHx:  Family History  Problem Relation Age of Onset  . Diabetes Mother     SOCHx:   reports that she has never smoked. She has never used smokeless tobacco. She reports that she drinks alcohol. She reports that she does not use drugs.  ALLERGIES:  Allergies  Allergen Reactions  . Lisinopril Cough    ROS: Pertinent items noted in HPI and remainder of  comprehensive ROS otherwise negative.  HOME MEDS: Current Outpatient Medications on File Prior to Visit  Medication Sig Dispense Refill  . ASPIRIN 81 PO Take 81 mg by mouth daily.    . furosemide (LASIX) 40 MG tablet Take 1 tablet (40 mg total) by mouth 2 (two) times daily. 30 tablet 11  . Omega-3 Fatty Acids (FISH OIL PO) Take by mouth.     No current facility-administered medications on file prior to visit.     LABS/IMAGING: No results found for this or any previous visit (from the past 48 hour(s)). No results found.  LIPID PANEL:    Component Value Date/Time   CHOL 202 (H) 04/27/2018 1513   TRIG 84 04/27/2018 1513   HDL 37 (L) 04/27/2018 1513   CHOLHDL 5.5 04/27/2018 1513   VLDL 17 04/27/2018 1513   LDLCALC 148 (H) 04/27/2018 1513     WEIGHTS: Wt Readings from Last 3 Encounters:  05/25/18 268 lb (121.6 kg)  04/29/18 276 lb 8 oz (125.4 kg)    VITALS: BP (!) 148/100 (BP Location: Left Arm, Patient Position: Sitting, Cuff Size: Large)   Pulse 97   Ht 5\' 8"  (1.727 m)   Wt 268 lb (121.6 kg)   BMI 40.75 kg/m   EXAM: General appearance: alert, no distress and morbidly obese Neck: JVD - 3 cm above sternal notch, no carotid bruit and thyroid not enlarged, symmetric, no tenderness/mass/nodules Lungs: diminished breath sounds bibasilar and rales bibasilar Heart: regular  rate and rhythm Abdomen: soft, non-tender; bowel sounds normal; no masses,  no organomegaly Extremities: extremities normal, atraumatic, no cyanosis or edema Pulses: 2+ and symmetric Skin: Skin color, texture, turgor normal. No rashes or lesions Neurologic: Grossly normal Psych: Pleasant  EKG: Deferred  ASSESSMENT: 1. Acute on chronic systolic congestive heart failure, LVEF 25 to 30% (04/2018) 2. NYHA class II-III symptoms 3. Morbid obesity 4. Poorly controlled hypertension 5. History of medication noncompliance  PLAN: 1.   Ms. Spohr presents with acute on chronic congestive heart failure and  was recently found to have an LVEF 25 to 30%.  This is similar to presentation she had in 2006.  At the time she said she had stress testing which will need to confirm to make sure she is nonischemic.  She continues to have NYHA class II-III symptoms and is poorly controlled hypertension and persistent cough.  I like to discontinue lisinopril and switch her to losartan I will increase the dose relatively for better blood pressure control to 50 mg daily.  She will continue on Toprol-XL, Lasix 40 mg twice daily, statin and other medications.  She will need prescriptions for these as they were all given to her in the hospital without refills.  She was also told to establish with community health and wellness which she has not yet and we will direct her that way for management of her diabetes and other primary care issues.  Plan follow-up with me in 3 months.  Pixie Casino, MD, [facility], [place] Director of the [facility] Lipid [facility] &  Cardiovascular Risk Reduction Clinic Diplomate of the American Board of Clinical Lipidology Attending Cardiologist  Direct Dial: [phone]  Fax: [phone]  Website:  [email or web address]  05/25/2018, 2:52 PM

## 2018-05-25 NOTE — Patient Instructions (Signed)
Medication Instructions:  STOP lisinopril  START losartan 50mg  daily If you need a refill on your cardiac medications before your next appointment, please call your pharmacy.    Follow-Up: At [facility], you and your health needs are our priority.  As part of our continuing mission to provide you with exceptional heart care, we have created designated Provider Care Teams.  These Care Teams include your primary Cardiologist (physician) and Advanced Practice Providers (APPs -  Physician Assistants and Nurse Practitioners) who all work together to provide you with the care you need, when you need it. You will need a follow up appointment in 3 months. You may see Dr. Debara Pickett or one of the following Advanced Practice Providers on your designated Care Team: Almyra Deforest, [place] . Fabian Sharp, PA-C  Any Other Special Instructions Will Be Listed Below (If Applicable).  Please call [place] [facility] to schedule an appointment  Address: [address], [place], [place] [address]  Phone: [phone]  Please provide more info on the doctor or hospital you were treated at in [place] so that records can be requested

## 2018-06-08 ENCOUNTER — Ambulatory Visit: Payer: Self-pay | Attending: Family Medicine | Admitting: Physician Assistant

## 2018-06-08 ENCOUNTER — Other Ambulatory Visit: Payer: Self-pay

## 2018-06-08 VITALS — BP 156/103 | HR 90 | Temp 97.5°F | Resp 26 | Wt 274.0 lb

## 2018-06-08 DIAGNOSIS — I5020 Unspecified systolic (congestive) heart failure: Secondary | ICD-10-CM

## 2018-06-08 DIAGNOSIS — I5023 Acute on chronic systolic (congestive) heart failure: Secondary | ICD-10-CM | POA: Insufficient documentation

## 2018-06-08 DIAGNOSIS — I428 Other cardiomyopathies: Secondary | ICD-10-CM | POA: Insufficient documentation

## 2018-06-08 DIAGNOSIS — Z888 Allergy status to other drugs, medicaments and biological substances status: Secondary | ICD-10-CM | POA: Insufficient documentation

## 2018-06-08 DIAGNOSIS — I5189 Other ill-defined heart diseases: Secondary | ICD-10-CM

## 2018-06-08 DIAGNOSIS — M7989 Other specified soft tissue disorders: Secondary | ICD-10-CM | POA: Insufficient documentation

## 2018-06-08 DIAGNOSIS — Z79899 Other long term (current) drug therapy: Secondary | ICD-10-CM | POA: Insufficient documentation

## 2018-06-08 DIAGNOSIS — R05 Cough: Secondary | ICD-10-CM | POA: Insufficient documentation

## 2018-06-08 DIAGNOSIS — E119 Type 2 diabetes mellitus without complications: Secondary | ICD-10-CM | POA: Insufficient documentation

## 2018-06-08 DIAGNOSIS — I110 Hypertensive heart disease with heart failure: Secondary | ICD-10-CM | POA: Insufficient documentation

## 2018-06-08 DIAGNOSIS — I10 Essential (primary) hypertension: Secondary | ICD-10-CM

## 2018-06-08 DIAGNOSIS — Z833 Family history of diabetes mellitus: Secondary | ICD-10-CM | POA: Insufficient documentation

## 2018-06-08 DIAGNOSIS — Z7982 Long term (current) use of aspirin: Secondary | ICD-10-CM | POA: Insufficient documentation

## 2018-06-08 DIAGNOSIS — R000 Tachycardia, unspecified: Secondary | ICD-10-CM | POA: Insufficient documentation

## 2018-06-08 LAB — GLUCOSE, POCT (MANUAL RESULT ENTRY): POC Glucose: 141 mg/dl — AB (ref 70–99)

## 2018-06-08 MED ORDER — TRUE METRIX METER DEVI: 1.0000 | Freq: Four times a day (QID) | 0 refills | Status: DC

## 2018-06-08 MED ORDER — LOSARTAN POTASSIUM 50 MG PO TABS: 50.0000 mg | ORAL_TABLET | Freq: Every day | ORAL | 11 refills | Status: DC

## 2018-06-08 MED ORDER — ATORVASTATIN CALCIUM 40 MG PO TABS: 40.0000 mg | ORAL_TABLET | Freq: Every day | ORAL | 11 refills | Status: DC

## 2018-06-08 MED ORDER — POTASSIUM CHLORIDE ER 10 MEQ PO TBCR: 10.0000 meq | EXTENDED_RELEASE_TABLET | Freq: Two times a day (BID) | ORAL | 11 refills | Status: DC

## 2018-06-08 MED ORDER — ASPIRIN 81 MG PO TBEC: 81.0000 mg | DELAYED_RELEASE_TABLET | Freq: Every day | ORAL | 12 refills | Status: DC

## 2018-06-08 MED ORDER — GLUCOSE BLOOD VI STRP: ORAL_STRIP | 11 refills | Status: DC

## 2018-06-08 MED ORDER — METOPROLOL SUCCINATE ER 50 MG PO TB24: 50.0000 mg | ORAL_TABLET | Freq: Every day | ORAL | 11 refills | Status: DC

## 2018-06-08 MED ORDER — GLIPIZIDE 5 MG PO TABS: 2.5000 mg | ORAL_TABLET | Freq: Every day | ORAL | 11 refills | Status: DC

## 2018-06-08 MED ORDER — TRUEPLUS LANCETS 28G MISC: 28.0000 g | Freq: Four times a day (QID) | 11 refills | Status: DC

## 2018-06-08 MED ORDER — FUROSEMIDE 40 MG PO TABS: 40.0000 mg | ORAL_TABLET | Freq: Two times a day (BID) | ORAL | 11 refills | Status: DC

## 2018-06-08 MED FILL — TRUE METRIX TEST STRIP: 25 days supply | Qty: 100 | Fill #0

## 2018-06-08 MED FILL — METOPROLOL SUCCINATE ER 50: 50 | 30 days supply | Qty: 30 | Fill #0

## 2018-06-08 MED FILL — FUROSEMIDE 40 MG TAB: 40 | 30 days supply | Qty: 60 | Fill #0

## 2018-06-08 MED FILL — LOSARTAN POTASSIUM 50 MG TA: 50 | 30 days supply | Qty: 30 | Fill #0

## 2018-06-08 MED FILL — ATORVASTATIN CALCIUM 40 MG: 40 | 30 days supply | Qty: 30 | Fill #0

## 2018-06-08 MED FILL — TRUEplus LANCETS 28G MISC: 25 days supply | Qty: 100 | Fill #0

## 2018-06-08 MED FILL — POTASSIUM CHLORIDE ER 10 ME: 10 | 30 days supply | Qty: 60 | Fill #0

## 2018-06-08 MED FILL — !TRUE METRIX BLOOD GLUCOSE: 25 days supply | Qty: 1 | Fill #0

## 2018-06-08 MED FILL — glipiZIDE 5 MG TABS: 5 | 30 days supply | Qty: 15 | Fill #0

## 2018-06-08 NOTE — Progress Notes (Signed)
Tracey Gallegos  RJJ:[id number]  [id number]  DOB - 09-16-1955  Chief Complaint  Patient presents with  . Hospitalization Follow-up    CHF       Subjective:   Tracey Gallegos is a 62 y.o. female here today for establishment of care.  She has a history of heart failure diagnosed in 2006.  She is not sure what her ejection fraction was and is been lost to cardiac follow-up for several years.  She also has a history of diabetes and hypertension.  Is been at least one year since she saw her primary care provider routinely.  Around that time she moved down from [place].  She is now retired.  She presented to an urgent care in early October with cough and shortness of breath.  She was diagnosed with bronchitis.  On 04/27/2018 she presented to [facility] with increasing shortness of breath, dyspnea, lower extremity edema, orthopnea and chest pain.  Her blood pressure was elevated.  She was tachycardic.  X-ray showed pulmonary edema and cardiomegaly.  Her EKG showed sinus tachycardia.  Her peptide was 516.  Troponin negative.  She was treated in the emergency department with IV nitroglycerin and IV Lasix with good response.  She was admitted by the internal medicine team and cardiology did see her.  An echo shows her EF to be 25 to 30%.  She has global hypokinesia, 3/4 diastolic dysfunction and moderate pulmonary hypertension.  She was sent home on ACE inhibitor, Lasix, beta-blocker and a statin.  She is diabetic and her A1c was 7.8%.  She was restarted on glipizide 2.5 mg daily.  In regards to her blood sugar she does not have a meter.  She has had cardiology follow-up on 05/25/2018 with Dr. Debara Pickett.  At that time she had a cough and her ACE inhibitor was changed to an ARB.  She has been tolerating her medications.  She still has a cough.  Her breathing is a little short especially with exertion.  She has swelling in her lower extremities.  No nausea or vomiting.  No chest pain.  No dizziness.  Compliant  with her medications.   ROS: GEN: denies fever or chills, denies change in weight Skin: denies lesions or rashes HEENT: denies headache, earache, epistaxis, sore throat, or neck pain LUNGS: + SHOB, dyspnea, PND, orthopnea CV: denies CP or palpitations ABD: denies abd pain, N or V EXT:+ swelling; no pain in lower ext, no weakness NEURO: denies numbness or tingling, denies sz, stroke or TIA  ALLERGIES: Allergies  Allergen Reactions  . Lisinopril Cough    PAST MEDICAL HISTORY: Past Medical History:  Diagnosis Date  . CHF (congestive heart failure) ([place])   . Diabetes mellitus without complication ([place])   . Hypertension     PAST SURGICAL HISTORY: No past surgical history on file.  MEDICATIONS AT HOME: Prior to Admission medications   Medication Sig Start Date End Date Taking? Authorizing Provider  Omega-3 Fatty Acids (FISH OIL PO) Take by mouth.   Yes [provider]  aspirin (ASPIRIN 81) 81 MG EC tablet Take 1 tablet (81 mg total) by mouth daily. 06/08/18   Brayton Caves, PA-C  atorvastatin (LIPITOR) 40 MG tablet Take 1 tablet (40 mg total) by mouth daily at 6 PM. 06/08/18  Yes Ena Dawley, Julian Askin S, PA-C  Blood Glucose Monitoring Suppl (TRUE METRIX METER) DEVI 1 kit by Does not apply route 4 (four) times daily. 06/08/18   Brayton Caves, PA-C  furosemide (LASIX) 40  MG tablet Take 1 tablet (40 mg total) by mouth 2 (two) times daily. 06/08/18 06/08/19 Yes Ena Dawley, Sherrod Toothman S, PA-C  glipiZIDE (GLUCOTROL) 5 MG tablet Take 0.5 tablets (2.5 mg total) by mouth daily before breakfast. 06/08/18  Yes Ena Dawley, Hamed Debella S, PA-C  glucose blood (TRUE METRIX BLOOD GLUCOSE TEST) test strip Use as instructed 06/08/18   Brayton Caves, PA-C  losartan (COZAAR) 50 MG tablet Take 1 tablet (50 mg total) by mouth daily. 06/08/18  Yes Ena Dawley, Devonna Oboyle S, PA-C  metoprolol succinate (TOPROL-XL) 50 MG 24 hr tablet Take 1 tablet (50 mg total) by mouth daily. 06/08/18  Yes Ena Dawley, Markey Deady S, PA-C  potassium  chloride (K-DUR) 10 MEQ tablet Take 1 tablet (10 mEq total) by mouth 2 (two) times daily. 06/08/18  Yes Yina Riviere S, PA-C  TRUEPLUS LANCETS 28G MISC 28 g by Does not apply route 4 (four) times daily. 06/08/18   Brayton Caves, PA-C    Family History  Problem Relation Age of Onset  . Diabetes Mother    Social-unmarried, no children, retired from [employer], previously lived In [place]  Objective:   Vitals:   06/08/18 1109  BP: (!) 156/103  Pulse: 90  Resp: (!) 26  Temp: (!) 97.5 F (36.4 C)  SpO2: 94%  Weight: 274 lb (124.3 kg)    Exam General appearance : Awake, alert, not in any distress. Speech Clear. Not toxic looking HEENT: Atraumatic and Normocephalic, pupils equally reactive to light and accomodation; minimal JVD. Neck: supple, no JVD. No cervical lymphadenopathy.  Chest:Good air entry bilaterally, no added sounds, no crackles. CVS: S1 S2 regular, no murmurs. No gallup. Abdomen: Bowel sounds present, Non tender and not distended with no guarding, rigidity or rebound. Extremities: B/L Lower Ext shows 2+ edema, both legs are warm to touch Neurology: Awake alert, and oriented X 3, CN II-XII intact, Non focal Skin:No Rash Wounds:N/A  Data Review Lab Results  Component Value Date   HGBA1C 7.8 (H) 04/27/2018     Assessment & Plan  1. Acute on Chronic Systolic Heart Failure/HFrEF  -double up on Lasix for 3 days  -GDMT  -inc BB for better BP control  -low salt diet  -BMP today 2. Cardiomyopathy, presumed nonischemic  -GDMT  -echo in 3 mo  -Dr. Debara Pickett following 3. DM 2  -meter issued  -cont glipizide  -Aim for 30 minutes of exercise most days. Rethink what you drink. Water is great! Aim for 2-3 Carb Choices per meal (30-45 grams) +/- 1 either way  Aim for 0-15 Carbs per snack if hungry  Include protein in moderation with your meals and snacks  Consider reading food labels for Total Carbohydrate and Fat Grams of foods  Consider checking BG at alternate times per  day  Continue taking medication as directed Be mindful about how much sugar you are adding to beverages and other foods. Fruit Punch - find one with no sugar  Measure and decrease portions of carbohydrate foods  Make your plate and don't go back for seconds 4. HTN  -BB increased  -BMP today   Return in about 2 weeks (around 06/22/2018).  The patient was given clear instructions to go to ER or return to medical center if symptoms don't improve, worsen or new problems develop. The patient verbalized understanding. The patient was told to call to get lab results if they haven't heard anything in the next week.   Total time spent with patient was 62 min. Greater than 50 % of  this visit was spent face to face counseling and coordinating care regarding risk factor modification, compliance importance and encouragement, education related to hospitalization review and current issues to be addressed.  This note has been created with Surveyor, quantity. Any transcriptional errors are unintentional.    Zettie Pho, PA-C [facility] and [place], [place]   06/08/2018, 11:38 AM

## 2018-06-08 NOTE — Progress Notes (Signed)
Hospital follow-up -Trouble breathing still gets winded easily -cough, non productive even after change of medication   CBG- 141

## 2018-06-08 NOTE — Patient Instructions (Signed)
Aim for 30 minutes of exercise most days. Rethink what you drink. Water is great! Aim for 2-3 Carb Choices per meal (30-45 grams) +/- 1 either way  Aim for 0-15 Carbs per snack if hungry  Include protein in moderation with your meals and snacks  Consider reading food labels for Total Carbohydrate and Fat Grams of foods  Consider checking BG at alternate times per day  Continue taking medication as directed Be mindful about how much sugar you are adding to beverages and other foods. Fruit Punch - find one with no sugar  Measure and decrease portions of carbohydrate foods  Make your plate and don't go back for seconds   Double up on Lasix for 6 doses (80 mg twice daily) then return to 40 mg twice daily Watch salt intake Elevate legs when at home Increased the Metoprolol 50 mg daily

## 2018-06-09 LAB — BASIC METABOLIC PANEL
BUN / CREAT RATIO: 19 (ref 12–28)
BUN: 19 mg/dL (ref 8–27)
CO2: 23 mmol/L (ref 20–29)
CREATININE: 1.02 mg/dL — AB (ref 0.57–1.00)
Calcium: 9.6 mg/dL (ref 8.7–10.3)
Chloride: 101 mmol/L (ref 96–106)
GFR calc Af Amer: 68 mL/min/{1.73_m2} (ref >59)
GFR, EST NON AFRICAN AMERICAN: 59 mL/min/{1.73_m2} — AB (ref >59)
Glucose: 148 mg/dL — ABNORMAL HIGH (ref 65–99)
POTASSIUM: 4.2 mmol/L (ref 3.5–5.2)
SODIUM: 140 mmol/L (ref 134–144)

## 2018-06-24 ENCOUNTER — Encounter: Payer: Self-pay | Admitting: Internal Medicine

## 2018-06-24 ENCOUNTER — Ambulatory Visit: Payer: Self-pay | Attending: Internal Medicine | Admitting: Internal Medicine

## 2018-06-24 ENCOUNTER — Inpatient Hospital Stay (HOSPITAL_COMMUNITY)
Admission: EM | Admit: 2018-06-24 | Discharge: 2018-06-30 | DRG: 246 | Disposition: A | Discharge status: Home / Self Care | Payer: Self-pay | Attending: Internal Medicine | Admitting: Internal Medicine

## 2018-06-24 ENCOUNTER — Emergency Department (HOSPITAL_COMMUNITY): Payer: Self-pay

## 2018-06-24 ENCOUNTER — Encounter (HOSPITAL_COMMUNITY): Payer: Self-pay | Admitting: General Practice

## 2018-06-24 ENCOUNTER — Other Ambulatory Visit: Payer: Self-pay

## 2018-06-24 VITALS — BP 150/93 | HR 89 | Temp 97.5°F | Ht 68.0 in | Wt 280.0 lb

## 2018-06-24 DIAGNOSIS — Z888 Allergy status to other drugs, medicaments and biological substances status: Secondary | ICD-10-CM | POA: Insufficient documentation

## 2018-06-24 DIAGNOSIS — I5023 Acute on chronic systolic (congestive) heart failure: Secondary | ICD-10-CM

## 2018-06-24 DIAGNOSIS — Z6841 Body Mass Index (BMI) 40.0 and over, adult: Secondary | ICD-10-CM

## 2018-06-24 DIAGNOSIS — E1122 Type 2 diabetes mellitus with diabetic chronic kidney disease: Secondary | ICD-10-CM | POA: Diagnosis present

## 2018-06-24 DIAGNOSIS — Z833 Family history of diabetes mellitus: Secondary | ICD-10-CM | POA: Insufficient documentation

## 2018-06-24 DIAGNOSIS — Z7984 Long term (current) use of oral hypoglycemic drugs: Secondary | ICD-10-CM

## 2018-06-24 DIAGNOSIS — I5021 Acute systolic (congestive) heart failure: Secondary | ICD-10-CM | POA: Diagnosis present

## 2018-06-24 DIAGNOSIS — I130 Hypertensive heart and chronic kidney disease with heart failure and stage 1 through stage 4 chronic kidney disease, or unspecified chronic kidney disease: Principal | ICD-10-CM | POA: Diagnosis present

## 2018-06-24 DIAGNOSIS — R05 Cough: Secondary | ICD-10-CM | POA: Insufficient documentation

## 2018-06-24 DIAGNOSIS — E119 Type 2 diabetes mellitus without complications: Secondary | ICD-10-CM

## 2018-06-24 DIAGNOSIS — I272 Pulmonary hypertension, unspecified: Secondary | ICD-10-CM | POA: Insufficient documentation

## 2018-06-24 DIAGNOSIS — Z79899 Other long term (current) drug therapy: Secondary | ICD-10-CM | POA: Insufficient documentation

## 2018-06-24 DIAGNOSIS — Z7982 Long term (current) use of aspirin: Secondary | ICD-10-CM

## 2018-06-24 DIAGNOSIS — R0989 Other specified symptoms and signs involving the circulatory and respiratory systems: Secondary | ICD-10-CM | POA: Insufficient documentation

## 2018-06-24 DIAGNOSIS — I2510 Atherosclerotic heart disease of native coronary artery without angina pectoris: Secondary | ICD-10-CM | POA: Diagnosis present

## 2018-06-24 DIAGNOSIS — I472 Ventricular tachycardia: Secondary | ICD-10-CM | POA: Diagnosis not present

## 2018-06-24 DIAGNOSIS — I420 Dilated cardiomyopathy: Secondary | ICD-10-CM | POA: Diagnosis present

## 2018-06-24 DIAGNOSIS — Z955 Presence of coronary angioplasty implant and graft: Secondary | ICD-10-CM

## 2018-06-24 DIAGNOSIS — I110 Hypertensive heart disease with heart failure: Secondary | ICD-10-CM | POA: Insufficient documentation

## 2018-06-24 DIAGNOSIS — I5043 Acute on chronic combined systolic (congestive) and diastolic (congestive) heart failure: Secondary | ICD-10-CM | POA: Diagnosis present

## 2018-06-24 DIAGNOSIS — E785 Hyperlipidemia, unspecified: Secondary | ICD-10-CM | POA: Diagnosis present

## 2018-06-24 DIAGNOSIS — E876 Hypokalemia: Secondary | ICD-10-CM | POA: Diagnosis present

## 2018-06-24 DIAGNOSIS — I10 Essential (primary) hypertension: Secondary | ICD-10-CM

## 2018-06-24 DIAGNOSIS — Z9114 Patient's other noncompliance with medication regimen: Secondary | ICD-10-CM

## 2018-06-24 DIAGNOSIS — N182 Chronic kidney disease, stage 2 (mild): Secondary | ICD-10-CM | POA: Diagnosis present

## 2018-06-24 DIAGNOSIS — R0902 Hypoxemia: Secondary | ICD-10-CM | POA: Insufficient documentation

## 2018-06-24 HISTORY — DX: Atherosclerotic heart disease of native coronary artery without angina pectoris: I25.10

## 2018-06-24 HISTORY — DX: Dyspnea, unspecified: R06.00

## 2018-06-24 LAB — CBC WITH DIFFERENTIAL/PLATELET
Abs Immature Granulocytes: 0.02 10*3/uL (ref 0.00–0.07)
Basophils Absolute: 0.1 10*3/uL (ref 0.0–0.1)
Basophils Relative: 2 %
Eosinophils Absolute: 0.8 10*3/uL — ABNORMAL HIGH (ref 0.0–0.5)
Eosinophils Relative: 14 %
HCT: 39.2 % (ref 36.0–46.0)
Hemoglobin: 12.1 g/dL (ref 12.0–15.0)
Immature Granulocytes: 0 %
Lymphocytes Relative: 28 %
Lymphs Abs: 1.6 10*3/uL (ref 0.7–4.0)
MCH: 26.1 pg (ref 26.0–34.0)
MCHC: 30.9 g/dL (ref 30.0–36.0)
MCV: 84.5 fL (ref 80.0–100.0)
Monocytes Absolute: 0.5 10*3/uL (ref 0.1–1.0)
Monocytes Relative: 8 %
Neutro Abs: 2.8 10*3/uL (ref 1.7–7.7)
Neutrophils Relative %: 48 %
Platelets: 227 10*3/uL (ref 150–400)
RBC: 4.64 MIL/uL (ref 3.87–5.11)
RDW: 17 % — ABNORMAL HIGH (ref 11.5–15.5)
WBC: 5.9 10*3/uL (ref 4.0–10.5)
nRBC: 0 % (ref 0.0–0.2)

## 2018-06-24 LAB — BASIC METABOLIC PANEL
ANION GAP: 12 (ref 5–15)
BUN: 18 mg/dL (ref 8–23)
CO2: 26 mmol/L (ref 22–32)
Calcium: 9 mg/dL (ref 8.9–10.3)
Chloride: 104 mmol/L (ref 98–111)
Creatinine, Ser: 1.1 mg/dL — ABNORMAL HIGH (ref 0.44–1.00)
GFR calc Af Amer: >60 mL/min (ref >60)
GFR, EST NON AFRICAN AMERICAN: 53 mL/min — AB (ref >60)
Glucose, Bld: 138 mg/dL — ABNORMAL HIGH (ref 70–99)
POTASSIUM: 3.6 mmol/L (ref 3.5–5.1)
SODIUM: 142 mmol/L (ref 135–145)

## 2018-06-24 LAB — BRAIN NATRIURETIC PEPTIDE: B NATRIURETIC PEPTIDE 5: 1177.4 pg/mL — AB (ref 0.0–100.0)

## 2018-06-24 LAB — TROPONIN I
TROPONIN I: 0.03 ng/mL — AB (ref <0.03)
Troponin I: 0.03 ng/mL (ref <0.03)

## 2018-06-24 LAB — GLUCOSE, POCT (MANUAL RESULT ENTRY): POC GLUCOSE: 181 mg/dL — AB (ref 70–99)

## 2018-06-24 LAB — GLUCOSE, CAPILLARY
GLUCOSE-CAPILLARY: 140 mg/dL — AB (ref 70–99)
Glucose-Capillary: 175 mg/dL — ABNORMAL HIGH (ref 70–99)

## 2018-06-24 MED ORDER — ALBUTEROL SULFATE (2.5 MG/3ML) 0.083% IN NEBU: 5.0000 mg | INHALATION_SOLUTION | Freq: Once | RESPIRATORY_TRACT | Status: DC

## 2018-06-24 MED ORDER — METOPROLOL SUCCINATE ER 50 MG PO TB24
50.0000 mg | ORAL_TABLET | Freq: Every day | ORAL | Status: DC
  Administered 2018-06-25 – 2018-06-30 (×6): 50 mg via ORAL
  Filled 2018-06-24 (×6): qty 1

## 2018-06-24 MED ORDER — SODIUM CHLORIDE 0.9 % IV SOLN
250.0000 mL | INTRAVENOUS | Status: DC | PRN
  Administered 2018-06-27: 250 mL via INTRAVENOUS

## 2018-06-24 MED ORDER — FUROSEMIDE 40 MG PO TABS: 40.0000 mg | ORAL_TABLET | Freq: Two times a day (BID) | ORAL | Status: DC

## 2018-06-24 MED ORDER — SODIUM CHLORIDE 0.9% FLUSH
3.0000 mL | Freq: Two times a day (BID) | INTRAVENOUS | Status: DC
  Administered 2018-06-24 – 2018-06-29 (×8): 3 mL via INTRAVENOUS

## 2018-06-24 MED ORDER — FUROSEMIDE 10 MG/ML IJ SOLN: 40.0000 mg | Freq: Once | INTRAMUSCULAR | Status: DC

## 2018-06-24 MED ORDER — ONDANSETRON HCL 4 MG/2ML IJ SOLN: 4.0000 mg | Freq: Four times a day (QID) | INTRAMUSCULAR | Status: DC | PRN

## 2018-06-24 MED ORDER — FUROSEMIDE 10 MG/ML IJ SOLN
40.0000 mg | Freq: Two times a day (BID) | INTRAMUSCULAR | Status: DC
  Administered 2018-06-24 – 2018-06-25 (×2): 40 mg via INTRAVENOUS
  Filled 2018-06-24 (×2): qty 4

## 2018-06-24 MED ORDER — INSULIN ASPART 100 UNIT/ML ~~LOC~~ SOLN
0.0000 [IU] | Freq: Three times a day (TID) | SUBCUTANEOUS | Status: DC
  Administered 2018-06-25: 3 [IU] via SUBCUTANEOUS
  Administered 2018-06-25: 1 [IU] via SUBCUTANEOUS
  Administered 2018-06-25 – 2018-06-26 (×2): 2 [IU] via SUBCUTANEOUS
  Administered 2018-06-26 (×2): 1 [IU] via SUBCUTANEOUS
  Administered 2018-06-27 – 2018-06-28 (×3): 2 [IU] via SUBCUTANEOUS
  Administered 2018-06-28 – 2018-06-29 (×3): 1 [IU] via SUBCUTANEOUS
  Administered 2018-06-29: 13:00:00 2 [IU] via SUBCUTANEOUS
  Administered 2018-06-30: 08:00:00 1 [IU] via SUBCUTANEOUS

## 2018-06-24 MED ORDER — ASPIRIN EC 81 MG PO TBEC
81.0000 mg | DELAYED_RELEASE_TABLET | Freq: Every day | ORAL | Status: DC
  Administered 2018-06-25 – 2018-06-30 (×5): 81 mg via ORAL
  Filled 2018-06-24 (×6): qty 1

## 2018-06-24 MED ORDER — POTASSIUM CHLORIDE CRYS ER 20 MEQ PO TBCR
40.0000 meq | EXTENDED_RELEASE_TABLET | Freq: Every day | ORAL | Status: DC
  Administered 2018-06-25 – 2018-06-29 (×5): 40 meq via ORAL
  Filled 2018-06-24: qty 4
  Filled 2018-06-24: qty 2
  Filled 2018-06-24 (×2): qty 4
  Filled 2018-06-24 (×2): qty 2
  Filled 2018-06-24 (×4): qty 4

## 2018-06-24 MED ORDER — ENOXAPARIN SODIUM 40 MG/0.4ML ~~LOC~~ SOLN
40.0000 mg | SUBCUTANEOUS | Status: DC
  Administered 2018-06-24 – 2018-06-27 (×4): 40 mg via SUBCUTANEOUS
  Filled 2018-06-24 (×4): qty 0.4

## 2018-06-24 MED ORDER — SODIUM CHLORIDE 0.9% FLUSH: 3.0000 mL | INTRAVENOUS | Status: DC | PRN

## 2018-06-24 MED ORDER — ACETAMINOPHEN 325 MG PO TABS
650.0000 mg | ORAL_TABLET | ORAL | Status: DC | PRN
  Administered 2018-06-28: 21:00:00 650 mg via ORAL
  Filled 2018-06-24 (×2): qty 2

## 2018-06-24 MED ORDER — FUROSEMIDE 10 MG/ML IJ SOLN: 40.0000 mg | Freq: Two times a day (BID) | INTRAMUSCULAR | Status: DC

## 2018-06-24 MED ORDER — ATORVASTATIN CALCIUM 40 MG PO TABS
40.0000 mg | ORAL_TABLET | Freq: Every day | ORAL | Status: DC
  Administered 2018-06-24 – 2018-06-29 (×6): 40 mg via ORAL
  Filled 2018-06-24 (×6): qty 1

## 2018-06-24 MED ORDER — LOSARTAN POTASSIUM 50 MG PO TABS
50.0000 mg | ORAL_TABLET | Freq: Every day | ORAL | Status: DC
Start: 2018-06-25 — End: 2018-06-28
  Administered 2018-06-25 – 2018-06-28 (×4): 50 mg via ORAL
  Filled 2018-06-24 (×4): qty 1

## 2018-06-24 NOTE — ED Provider Notes (Signed)
[facility] EMERGENCY DEPARTMENT Provider Note   CSN: [id number] Arrival date & time: 06/24/18  1129     History   Chief Complaint Chief Complaint  Patient presents with  . Shortness of Breath    HPI Tracey Gallegos is a 62 y.o. female.  HPI  62 year old female with a history of systolic CHF, type 2 diabetes and hypertension presents with shortness of breath and concern for CHF.  She got out of the hospital couple months ago for CHF.  The cough has not gone away.  Slowly her legs have become more swollen and she is been more short of breath.  Especially over the last 2 weeks or so.  She is been taking her twice daily Lasix as prescribed.  Went to her PCP today where she was found to have oxygen sats of 92% and told to come to the ED for admission.  No chest pain.  The shortness of breath is mostly with exertion, starting after only a few feet.  Past Medical History:  Diagnosis Date  . CHF (congestive heart failure) ([place])   . Diabetes mellitus without complication ([place])   . Hypertension     Patient Active Problem List   Diagnosis Date Noted  . Hypertensive heart disease with acute on chronic systolic congestive heart failure ([place]) 06/24/2018  . Type 2 diabetes mellitus without complication, without long-term current use of insulin ([place]) 06/24/2018  . CHF (congestive heart failure), NYHA class III, acute on chronic, systolic ([place]) 14/78/2956  . Acute systolic (congestive) heart failure ([place]) 06/24/2018  . HTN (hypertension) 06/24/2018  . Acute exacerbation of CHF (congestive heart failure) ([place]) 04/27/2018    No past surgical history on file.   OB History   None      Home Medications    Prior to Admission medications   Medication Sig Start Date End Date Taking? Authorizing Provider  aspirin (ASPIRIN 81) 81 MG EC tablet Take 1 tablet (81 mg total) by mouth daily. 06/08/18  Yes Ena Dawley, Tiffany S, PA-C  atorvastatin (LIPITOR) 40 MG tablet Take 1 tablet (40  mg total) by mouth daily at 6 PM. 06/08/18  Yes Ena Dawley, Tiffany S, PA-C  furosemide (LASIX) 40 MG tablet Take 1 tablet (40 mg total) by mouth 2 (two) times daily. 06/08/18 06/08/19 Yes Ena Dawley, Tiffany S, PA-C  glipiZIDE (GLUCOTROL) 5 MG tablet Take 0.5 tablets (2.5 mg total) by mouth daily before breakfast. 06/08/18  Yes Ena Dawley, Tiffany S, PA-C  losartan (COZAAR) 50 MG tablet Take 1 tablet (50 mg total) by mouth daily. 06/08/18  Yes Ena Dawley, Tiffany S, PA-C  metoprolol succinate (TOPROL-XL) 50 MG 24 hr tablet Take 1 tablet (50 mg total) by mouth daily. 06/08/18  Yes Noel, Tiffany S, PA-C  Omega-3 Fatty Acids (FISH OIL PO) Take 1,000 mg by mouth daily.    Yes [provider]  potassium chloride (K-DUR) 10 MEQ tablet Take 1 tablet (10 mEq total) by mouth 2 (two) times daily. 06/08/18  Yes Noel, Tiffany S, PA-C  Blood Glucose Monitoring Suppl (TRUE METRIX METER) DEVI 1 kit by Does not apply route 4 (four) times daily. 06/08/18   Brayton Caves, PA-C  glucose blood (TRUE METRIX BLOOD GLUCOSE TEST) test strip Use as instructed 06/08/18   Brayton Caves, PA-C  TRUEPLUS LANCETS 28G MISC 28 g by Does not apply route 4 (four) times daily. 06/08/18   Brayton Caves, PA-C    Family History Family History  Problem Relation Age of Onset  .  Diabetes Mother     Social History Social History   Tobacco Use  . Smoking status: Never Smoker  . Smokeless tobacco: Never Used  Substance Use Topics  . Alcohol use: Yes    Comment: occasionally  . Drug use: Never     Allergies   Lisinopril   Review of Systems Review of Systems  Respiratory: Positive for cough and shortness of breath.   Cardiovascular: Positive for leg swelling. Negative for chest pain.  All other systems reviewed and are negative.    Physical Exam Updated Vital Signs BP (!) 149/104   Pulse 72   Resp (!) 33   SpO2 92%   Physical Exam  Constitutional: She appears well-developed and well-nourished.  Non-toxic appearance. She  does not appear ill. No distress.  obese  HENT:  Head: Normocephalic and atraumatic.  Right Ear: External ear normal.  Left Ear: External ear normal.  Nose: Nose normal.  Eyes: Right eye exhibits no discharge. Left eye exhibits no discharge.  Cardiovascular: Normal rate, regular rhythm and normal heart sounds.  Pulmonary/Chest: Effort normal. No accessory muscle usage. Tachypnea noted. She has rales in the right lower field and the left lower field.  Abdominal: Soft. There is no tenderness.  Musculoskeletal:       Right lower leg: She exhibits edema (pitting).       Left lower leg: She exhibits edema (pitting).  Neurological: She is alert.  Skin: Skin is warm and dry.  Psychiatric: Her mood appears not anxious.  Nursing note and vitals reviewed.    ED Treatments / Results  Labs (all labs ordered are listed, but only abnormal results are displayed) Labs Reviewed  BASIC METABOLIC PANEL - Abnormal; Notable for the following components:      Result Value   Glucose, Bld 138 (*)    Creatinine, Ser 1.10 (*)    GFR calc non Af Amer 53 (*)    All other components within normal limits  BRAIN NATRIURETIC PEPTIDE - Abnormal; Notable for the following components:   B Natriuretic Peptide 1,177.4 (*)    All other components within normal limits  TROPONIN I - Abnormal; Notable for the following components:   Troponin I 0.03 (*)    All other components within normal limits  CBC WITH DIFFERENTIAL/PLATELET - Abnormal; Notable for the following components:   RDW 17.0 (*)    Eosinophils Absolute 0.8 (*)    All other components within normal limits    EKG EKG Interpretation  Date/Time:  Thursday June 24 2018 14:13:34 EST Ventricular Rate:  77 PR Interval:    QRS Duration: 108 QT Interval:  569 QTC Calculation: 645 R Axis:   1 Text Interpretation:  Sinus rhythm Probable left atrial enlargement Probable anteroseptal infarct, old Prolonged QT interval similar to Oct 2019 but QT longer  Confirmed by Sherwood Gambler [phone]) on 06/24/2018 2:46:49 PM   Radiology Dg Chest 2 View  Result Date: 06/24/2018 CLINICAL DATA:  Shortness of breath for 2 days EXAM: CHEST - 2 VIEW COMPARISON:  02/26/2009 FINDINGS: Cardiac shadow remains enlarged. Central vascular congestion is again identified with mild edema stable from the previous exam. No focal infiltrate or effusion is noted. No acute bony abnormality is seen. IMPRESSION: Mild vascular congestion and edema. Electronically Signed   By: Inez Catalina M.D.   On: 06/24/2018 13:40    Procedures Procedures (including critical care time)  Medications Ordered in ED Medications  furosemide (LASIX) injection 40 mg (has no administration in time range)  Initial Impression / Assessment and Plan / ED Course  I have reviewed the triage vital signs and the nursing notes.  Pertinent labs & imaging results that were available during my care of the patient were reviewed by me and considered in my medical decision making (see chart for details).     Patient appears to have slowly worsening heart failure.  She is tachypneic even at rest though not in distress.  Lab work otherwise is essentially benign except for mild troponin elevation and elevated BNP.  I think the troponin is from the heart failure and not ACS.  She will be diuresed and hospitalist will admit.  Final Clinical Impressions(s) / ED Diagnoses   Final diagnoses:  Acute on chronic systolic congestive heart failure [facility])    ED Discharge Orders    None       Sherwood Gambler, MD 06/24/18 1643

## 2018-06-24 NOTE — ED Triage Notes (Signed)
Pt arrived via pov from [facility] office after MD referred pt to ED for SOB. Pt is alert and oriented and not currentrly in distress. Pt has exertional sob and seems winded while talking. Pitting edema noted in bilateral LE. Pt has hx of CHF.

## 2018-06-24 NOTE — H&P (Addendum)
HISTORY AND PHYSICAL       PATIENT DETAILS Name: Tracey Gallegos Age: 62 y.o. Sex: female Date of Birth: Sep 15, 1955 Admit Date: 06/24/2018 CMK:[id number], Dalbert Batman, MD   Patient coming from: Home   CHIEF COMPLAINT:  Gradually worsening shortness of breath for the past 2 to 3 days-but ongoing for the past 1 month  HPI: Tracey Gallegos is a 62 y.o. female with medical history significant of chronic systolic heart failure (EF 25-30% by TTE on 04/28/2018), DM-2, hypertension presenting with the above-noted complaints.  Per patient for the past 1 month she is noticed shortness of breath-mostly on exertion.  However over the past few days-this is gradually worsened-she is now short of breath even walking a few feet.  She has now started to develop shortness of breath at rest-and has also developed orthopnea for the past few nights.  She has noted at least a 6 pound weight gain since he last saw her physician.  She also has noted worsening lower extremity edema.  Due to worsening of the symptoms-she presented to the ED-she was thought to have decompensated systolic heart failure and referred to the [facility] service for further evaluation and treatment.  Denies any fever, headache, chest pain, abdominal pain, nausea, vomiting or diarrhea.  She denies any dysuria or hematuria  ED Course:  Chest x-ray with pulmonary edema-given IV Lasix.  Note: Lives at: Home Mobility:  Independent Chronic Indwelling Foley:no   REVIEW OF SYSTEMS:  Constitutional:   No  weight loss, night sweats,  Fevers, chills, fatigue.  HEENT:    No headaches, Dysphagia,Tooth/dental problems,Sore throat  Cardio-vascular: No chest pain, anasarca, palpitations  GI:  No heartburn, indigestion, abdominal pain, nausea, vomiting, diarrhea, melena or hematochezia  Resp: No  cough, hemoptysis,plueritic chest pain.   Skin:  No rash or lesions.  GU:  No dysuria, change in color of urine, no urgency or  frequency.  No flank pain.  Musculoskeletal: No joint pain or swelling.  No decreased range of motion.  No back pain.  Endocrine: No heat intolerance, no cold intolerance, no polyuria, no polydipsia  Psych: No change in mood or affect. No depression or anxiety.  No memory loss.   ALLERGIES:   Allergies  Allergen Reactions  . Lisinopril Cough    PAST MEDICAL HISTORY: Past Medical History:  Diagnosis Date  . CHF (congestive heart failure) ([place])   . Diabetes mellitus without complication ([place])   . Hypertension     PAST SURGICAL HISTORY: No past surgical history on file.  MEDICATIONS AT HOME: Prior to Admission medications   Medication Sig Start Date End Date Taking? Authorizing Provider  aspirin (ASPIRIN 81) 81 MG EC tablet Take 1 tablet (81 mg total) by mouth daily. 06/08/18   Brayton Caves, PA-C  atorvastatin (LIPITOR) 40 MG tablet Take 1 tablet (40 mg total) by mouth daily at 6 PM. 06/08/18   Brayton Caves, PA-C  Blood Glucose Monitoring Suppl (TRUE METRIX METER) DEVI 1 kit by Does not apply route 4 (four) times daily. 06/08/18   Brayton Caves, PA-C  furosemide (LASIX) 40 MG tablet Take 1 tablet (40 mg total) by mouth 2 (two) times daily. 06/08/18 06/08/19  Brayton Caves, PA-C  glipiZIDE (GLUCOTROL) 5 MG tablet Take 0.5 tablets (2.5 mg total) by mouth daily before breakfast. 06/08/18   Brayton Caves, PA-C  glucose blood (TRUE METRIX BLOOD GLUCOSE TEST) test strip Use as instructed 06/08/18   Ena Dawley,  Tiffany S, PA-C  losartan (COZAAR) 50 MG tablet Take 1 tablet (50 mg total) by mouth daily. 06/08/18   Brayton Caves, PA-C  metoprolol succinate (TOPROL-XL) 50 MG 24 hr tablet Take 1 tablet (50 mg total) by mouth daily. 06/08/18   Brayton Caves, PA-C  Omega-3 Fatty Acids (FISH OIL PO) Take by mouth.    [provider]  potassium chloride (K-DUR) 10 MEQ tablet Take 1 tablet (10 mEq total) by mouth 2 (two) times daily. 06/08/18   Brayton Caves, PA-C    TRUEPLUS LANCETS 28G MISC 28 g by Does not apply route 4 (four) times daily. 06/08/18   Brayton Caves, PA-C    FAMILY HISTORY: Family History  Problem Relation Age of Onset  . Diabetes Mother      SOCIAL HISTORY:  reports that she has never smoked. She has never used smokeless tobacco. She reports that she drinks alcohol. She reports that she does not use drugs.  PHYSICAL EXAM: Blood pressure (!) 149/104, pulse 72, resp. rate (!) 33, SpO2 92 %.  General appearance :Awake, alert, not in any distress but is mildly tachypneic at rest. Speech Clear.  Eyes:Pink conjunctiva HEENT: Atraumatic and Normocephalic Neck: supple, +JVD.  Resp:Good air entry bilaterally, bibasilar rales CVS: S1 S2 regular, no murmurs.  GI: Bowel sounds present, Non tender and not distended with no gaurding, rigidity or rebound.No organomegaly Extremities: B/L Lower Ext shows++ edema, both legs are warm to touch Neurology:  speech clear,Non focal, sensation is grossly intact. Psychiatric: Normal judgment and insight. Alert and oriented x 3. Normal mood. Musculoskeletal:gait appears to be normal.No digital cyanosis Skin:No Rash, warm and dry Wounds:N/A  LABS ON ADMISSION:  I have personally reviewed following labs and imaging studies  CBC: Recent Labs  Lab 06/24/18 1305  WBC 5.9  NEUTROABS 2.8  HGB 12.1  HCT 39.2  MCV 84.5  PLT 465    Basic Metabolic Panel: Recent Labs  Lab 06/24/18 1305  NA 142  K 3.6  CL 104  CO2 26  GLUCOSE 138*  BUN 18  CREATININE 1.10*  CALCIUM 9.0    GFR: Estimated Creatinine Clearance: 73.6 mL/min (A) (by C-G formula based on SCr of 1.1 mg/dL (H)).  Liver Function Tests: No results for input(s): AST, ALT, ALKPHOS, BILITOT, PROT, ALBUMIN in the last 168 hours. No results for input(s): LIPASE, AMYLASE in the last 168 hours. No results for input(s): AMMONIA in the last 168 hours.  Coagulation Profile: No results for input(s): INR, PROTIME in the last 168  hours.  Cardiac Enzymes: Recent Labs  Lab 06/24/18 1305  TROPONINI 0.03*    BNP (last 3 results) No results for input(s): PROBNP in the last 8760 hours.  HbA1C: No results for input(s): HGBA1C in the last 72 hours.  CBG: No results for input(s): GLUCAP in the last 168 hours.  Lipid Profile: No results for input(s): CHOL, HDL, LDLCALC, TRIG, CHOLHDL, LDLDIRECT in the last 72 hours.  Thyroid Function Tests: No results for input(s): TSH, T4TOTAL, FREET4, T3FREE, THYROIDAB in the last 72 hours.  Anemia Panel: No results for input(s): VITAMINB12, FOLATE, FERRITIN, TIBC, IRON, RETICCTPCT in the last 72 hours.  Urine analysis: No results found for: COLORURINE, APPEARANCEUR, LABSPEC, PHURINE, GLUCOSEU, HGBUR, BILIRUBINUR, KETONESUR, PROTEINUR, UROBILINOGEN, NITRITE, LEUKOCYTESUR  Sepsis Labs: Lactic Acid, Venous No results found for: [place]   Microbiology: No results found for this or any previous visit (from the past 240 hour(s)).    RADIOLOGIC STUDIES ON ADMISSION: Dg  Chest 2 View  Result Date: 06/24/2018 CLINICAL DATA:  Shortness of breath for 2 days EXAM: CHEST - 2 VIEW COMPARISON:  02/26/2009 FINDINGS: Cardiac shadow remains enlarged. Central vascular congestion is again identified with mild edema stable from the previous exam. No focal infiltrate or effusion is noted. No acute bony abnormality is seen. IMPRESSION: Mild vascular congestion and edema. Electronically Signed   By: Inez Catalina M.D.   On: 06/24/2018 13:40    I have personally reviewed images of chest xray -pul edema  EKG:  Personally reviewed.NSR  ASSESSMENT AND PLAN: Acute on chronic systolic heart failure (EF 25-30% by TTE on 04/28/2018): Not sure what the reason for decompensation is-she claims to be compliant with medications and diet.Will admit to telemetry-start IV Lasix 40 mg twice daily-already on beta-blocker and ARB that are being continued.  Follow weights, electrolytes, intake output  closely.  This is her second hospitalization in the past few months-hence have consulted cardiology.  No need to order echo as just had one a few months back.  Will await further recommendations from cardiology.  Hypertension: Continue metoprolol and losartan-follow and adjust accordingly.  DM-2: Oral hypoglycemics will be held-start SSI and follow.  Further plan will depend as patient's clinical course evolves and further radiologic and laboratory data become available. Patient will be monitored closely.  Above noted plan was discussed with patient  face to face at bedside, she was in agreement.   CONSULTS: CARDIOLOGY  DVT Prophylaxis: Prophylactic Lovenox  Code Status: Full Code  Disposition Plan:  Discharge back home possibly in 2-3 days, depending on clinical course  Admission status: Inpatient  going to tele  The medical decision making on this patient was of high complexity and the patient is at high risk for clinical deterioration, therefore this is a level 3 visit.   Total time spent  55 minutes.Greater than 50% of this time was spent in counseling, explanation of diagnosis, planning of further management, and coordination of care.  Severity of Illness: The appropriate patient status for this patient is INPATIENT. Inpatient status is judged to be reasonable and necessary in order to provide the required intensity of service to ensure the patient's safety. The patient's presenting symptoms, physical exam findings, and initial radiographic and laboratory data in the context of their chronic comorbidities is felt to place them at high risk for further clinical deterioration. Furthermore, it is not anticipated that the patient will be medically stable for discharge from the hospital within 2 midnights of admission. The following factors support the patient status of inpatient.   " The patient's presenting symptoms include exertional dyspnea, tachypnea at rest, lower extremity  edema " The worrisome physical exam findings include + JVD, bibasilar rales, mild tachypnea at rest " The initial radiographic and laboratory data are worrisome because of pulmonary edema on x-ray  The chronic co-morbidities include chronic systolic heart failure, hypertension, obesity  * I certify that at the point of admission it is my clinical judgment that the patient will require inpatient hospital care spanning beyond 2 midnights from the point of admission due to high intensity of service, high risk for further deterioration and high frequency of surveillance required.**  Arlester Keehan [facility] Pager [phone]  If 7PM-7AM, please contact night-coverage [email or web address] Password [email or web address] 06/24/2018, 4:04 PM

## 2018-06-24 NOTE — Progress Notes (Signed)
Patient ID: Tracey Gallegos, female    DOB: [birth year]  MRN: [id number]  CC: Diabetes   Subjective: Tracey Gallegos is a 62 y.o. female who presents to establish care with me as PCP. Her concerns today include:  Patient with history of HTN, DM type II, CHF with EF of 25 to 30%, moderate pulmonary hypertension on echo 04/2018, morbid obesity  Pt seen by our PA about 3 wks ago and that note was reviewed by me.  Moved from [place] 1 yr.  HTN/CHF:  Reports compliance with meds and reportedly took them already for the morning.  Uses salt in the food "but not a lot. I get more salt when I eat out."  She has cut back on eating out to about once a wk -no device to check BP. -still c/o SOB with minimal activity, +LE edema.  She has gained an additional 6 pounds since last visit despite doubling up on furosemide for 3 days.  She has not noticed an increase in urination when she takes furosemide.  No PND or orthopnea.  Sleeps on 1 pillow.  -still has dry cough that has not change with d/c of ACE-I.  She feels the cough is worse if she eats too much. Endorses wheezing.  No hx of tob use.  No problems swallowing or with indigestion. No drainage of mucous at back of throat. Had cough since September.  -has f/u appt with cardiology 08/2018  DM: prescribed meter on last visit.  She has check BS only a few times since then. BS last night was 160 after dinner.  Patient Active Problem List   Diagnosis Date Noted  . Acute heart failure ([place]) 04/28/2018  . Acute exacerbation of CHF (congestive heart failure) ([place]) 04/27/2018     Current Outpatient Medications on File Prior to Visit  Medication Sig Dispense Refill  . aspirin (ASPIRIN 81) 81 MG EC tablet Take 1 tablet (81 mg total) by mouth daily. 30 tablet 12  . atorvastatin (LIPITOR) 40 MG tablet Take 1 tablet (40 mg total) by mouth daily at 6 PM. 30 tablet 11  . Blood Glucose Monitoring Suppl (TRUE METRIX METER) DEVI 1 kit by Does not apply route 4 (four) times  daily. 1 Device 0  . furosemide (LASIX) 40 MG tablet Take 1 tablet (40 mg total) by mouth 2 (two) times daily. 60 tablet 11  . glipiZIDE (GLUCOTROL) 5 MG tablet Take 0.5 tablets (2.5 mg total) by mouth daily before breakfast. 30 tablet 11  . glucose blood (TRUE METRIX BLOOD GLUCOSE TEST) test strip Use as instructed 120 each 11  . losartan (COZAAR) 50 MG tablet Take 1 tablet (50 mg total) by mouth daily. 30 tablet 11  . metoprolol succinate (TOPROL-XL) 50 MG 24 hr tablet Take 1 tablet (50 mg total) by mouth daily. 30 tablet 11  . Omega-3 Fatty Acids (FISH OIL PO) Take by mouth.    . potassium chloride (K-DUR) 10 MEQ tablet Take 1 tablet (10 mEq total) by mouth 2 (two) times daily. 60 tablet 11  . TRUEPLUS LANCETS 28G MISC 28 g by Does not apply route 4 (four) times daily. 120 each 11   No current facility-administered medications on file prior to visit.     Allergies  Allergen Reactions  . Lisinopril Cough    Social History   Socioeconomic History  . Marital status: Single    Spouse name: Not on file  . Number of children: Not on file  . Years of  education: Not on file  . Highest education level: Not on file  Occupational History  . Not on file  Social Needs  . Financial resource strain: Not on file  . Food insecurity:    Worry: Not on file    Inability: Not on file  . Transportation needs:    Medical: Not on file    Non-medical: Not on file  Tobacco Use  . Smoking status: Never Smoker  . Smokeless tobacco: Never Used  Substance and Sexual Activity  . Alcohol use: Yes    Comment: occasionally  . Drug use: Never  . Sexual activity: Not on file  Lifestyle  . Physical activity:    Days per week: Not on file    Minutes per session: Not on file  . Stress: Not on file  Relationships  . Social connections:    Talks on phone: Not on file    Gets together: Not on file    Attends religious service: Not on file    Active member of club or organization: Not on file     Attends meetings of clubs or organizations: Not on file    Relationship status: Not on file  . Intimate partner violence:    Fear of current or ex partner: Not on file    Emotionally abused: Not on file    Physically abused: Not on file    Forced sexual activity: Not on file  Other Topics Concern  . Not on file  Social History Narrative  . Not on file    Family History  Problem Relation Age of Onset  . Diabetes Mother     No past surgical history on file.  ROS: Review of Systems Negative except as above PHYSICAL EXAM: BP (!) 150/93   Pulse 89   Temp (!) 97.5 F (36.4 C) (Oral)   Ht '5\' 8"'$  (1.727 m)   Wt 280 lb (127 kg)   SpO2 92%   BMI 42.57 kg/m   Wt Readings from Last 3 Encounters:  06/24/18 280 lb (127 kg)  06/08/18 274 lb (124.3 kg)  05/25/18 268 lb (121.6 kg)  BP 150/100 [place] 87-92 % RA sitting, 86-89% RA with ambulation  Physical Exam  General appearance - alert, well appearing, and in no distress at rest.  + mild to moderate dyspnea with audible wheezing with ambulation down the hall   Mental status - normal mood, behavior, speech, dress, motor activity, and thought processes Mouth - mucous membranes moist, pharynx normal without lesions Neck - supple, no significant adenopathy Chest -decreased at bases with fine crackles LT base Heart - RRR, + JVD Abdomen -normal bowel sounds Extremities - 1-2+ LE edema   Results for orders placed or performed in visit on 06/24/18  POCT glucose (manual entry)  Result Value Ref Range   POC Glucose 181 (A) 70 - 99 mg/dl   Lab Results  Component Value Date   HGBA1C 7.8 (H) 04/27/2018     Chemistry      Component Value Date/Time   NA 140 06/08/2018 1151   K 4.2 06/08/2018 1151   CL 101 06/08/2018 1151   CO2 23 06/08/2018 1151   BUN 19 06/08/2018 1151   CREATININE 1.02 (H) 06/08/2018 1151      Component Value Date/Time   CALCIUM 9.6 06/08/2018 1151   ALKPHOS 91 04/27/2018 1613   AST 55 (H) 04/27/2018 1613    ALT 59 (H) 04/27/2018 1613   BILITOT 1.0 04/27/2018 1613  ASSESSMENT AND PLAN:  1. CHF (congestive heart failure), NYHA class III, acute on chronic, systolic (HCC) 2. Hypertensive heart disease with acute on chronic systolic congestive heart failure ([place]) 3. Hypoxia Patient is NYHA class II-III.  She is noted to have moderate dyspnea with ambulation down the hall today and she is also hypoxic.  She has gained 6 pounds since last visit.  She has crackles at the left base.  She reports compliance with her medications including furosemide. -I recommend that she be seen in the emergency room as I feel she needs to be diuresed and medications adjusted. Stressed the importance of low-salt diet. -Her cough may be due to CHF but also based on history, she may have some component of GERD and is worth a trial of omeprazole.  4. Type 2 diabetes mellitus without complication, without long-term current use of insulin (HCC) Advised to check blood sugars daily and bring in readings on next visit - POCT glucose (manual entry)   Patient was given the opportunity to ask questions.  Patient verbalized understanding of the plan and was able to repeat key elements of the plan.   Orders Placed This Encounter  Procedures  . POCT glucose (manual entry)     Requested Prescriptions    No prescriptions requested or ordered in this encounter    Return in about 2 weeks (around 07/08/2018).  Karle Plumber, MD, FACP

## 2018-06-24 NOTE — Progress Notes (Signed)
Patient is still having swelling in legs, and trouble breathing.

## 2018-06-24 NOTE — Patient Instructions (Signed)
Your congestive heart failure and blood pressure not well controlled.  These are causing the shortness of breath and low oxygen level.  I recommend that you be seen in the emergency room today for admission.

## 2018-06-24 NOTE — ED Notes (Signed)
edp notified verbally of trop level

## 2018-06-24 NOTE — Consult Note (Signed)
Cardiology Consultation:   Patient ID: Tracey Gallegos; [id number]; 1956/04/28   Admit date: 06/24/2018 Date of Consult: 06/24/2018  Primary Care Provider: Ladell Pier, MD Primary Cardiologist: Dr. Debara Pickett, MD  Patient Profile:   Tracey Gallegos is a 62 y.o. female with a hx of chronic systolic congestive heart failure (2006), DM 2 and hypertension who is being seen today for the evaluation of acute systolic CHF exacerbation at the request of Dr. Sloan Leiter.  History of Present Illness:   [name] is a 63 year old female with a history stated above who presented to [facility] on 06/24/2018 with complaints of shortness of breath that has been progressive for the last 1 month and has worsened in the last 1 to 2 days to the point she is short of breath at rest. She reports she was last seen by Dr. Debara Pickett 05/25/2018 and then seen at the [facility] wellness clinic.  At that time she was doing fairly well however they noticed that she was short of breath.  Her Lasix was increased to 80 mg for 3 days then return to 40 mg twice daily.  She reports symptom improvement with the dose increase however her shortness of breath returned shortly after returning to 40 mg twice daily.  She does not weigh herself daily and was seen again in the community health and wellness clinic today.  They report a 6 pound weight gain since the last time they saw her 2 weeks ago.  She has had worsening lower extremity edema never has denied chest pain, palpitations, dizziness, nausea, diaphoresis or syncope. She denies recent illness including fevers chills.  She has had an ongoing nonproductive cough. She reports complete medication compliance and reports no diet indiscretions.  Of note, patient was last seen by her primary cardiologist, Dr. Debara Pickett on 05/25/2018.  Per chart review, she has a history of acute systolic heart failure dating back to 2006 initially diagnosed in [place]. She reported that she was treated with diuretics  and had undergone a stress test which was apparently negative for ischemia.  As she moved around a lot, she was subsequently noncompliant with medications until recently after moving to [place], [place] last year.  She was recently admitted to the hospital for acute on chronic systolic CHF.  An echocardiogram showed an LVEF of 25 to 30% with severe global hypokinesis and a G3 DD with mild mitral regurgitation, severe left atrial enlargement and moderate pulmonary hypertension.  She was diuresed during that time and discharged on 04/29/2018.  She was continued on her ASA and statin as well as ACE inhibitor.  During the last office visit, she had complaints of persistent cough and her lisinopril was changed to losartan  She was continued on Toprol-XL, Lasix 40 mg twice daily and statin.    Two weeks later she was seen at community health and wellness center.  At that time she was short of breath and her Lasix was increased to 80 mg twice daily for 3 days then resume to 40 mg twice daily.  He was seen today again in the clinic and given her presentation she was referred to the emergency department for further evaluation.   The ED, CXR with mild vascular congestion and edema.  BNP was markedly elevated at 1177 and troponin mildly elevated at 0.03.  EKG without acute ischemic changes, NSR.  She was given IV Lasix with improvement.  Past Medical History:  Diagnosis Date  . CHF (congestive heart failure) ([place])   .  Diabetes mellitus without complication ([place])   . Hypertension     No past surgical history on file.   Prior to Admission medications   Medication Sig Start Date End Date Taking? Authorizing Provider  aspirin (ASPIRIN 81) 81 MG EC tablet Take 1 tablet (81 mg total) by mouth daily. 06/08/18  Yes Ena Dawley, Tiffany S, PA-C  atorvastatin (LIPITOR) 40 MG tablet Take 1 tablet (40 mg total) by mouth daily at 6 PM. 06/08/18  Yes Ena Dawley, Tiffany S, PA-C  furosemide (LASIX) 40 MG tablet Take 1 tablet (40 mg  total) by mouth 2 (two) times daily. 06/08/18 06/08/19 Yes Ena Dawley, Tiffany S, PA-C  glipiZIDE (GLUCOTROL) 5 MG tablet Take 0.5 tablets (2.5 mg total) by mouth daily before breakfast. 06/08/18  Yes Ena Dawley, Tiffany S, PA-C  losartan (COZAAR) 50 MG tablet Take 1 tablet (50 mg total) by mouth daily. 06/08/18  Yes Ena Dawley, Tiffany S, PA-C  metoprolol succinate (TOPROL-XL) 50 MG 24 hr tablet Take 1 tablet (50 mg total) by mouth daily. 06/08/18  Yes Noel, Tiffany S, PA-C  Omega-3 Fatty Acids (FISH OIL PO) Take 1,000 mg by mouth daily.    Yes [provider]  potassium chloride (K-DUR) 10 MEQ tablet Take 1 tablet (10 mEq total) by mouth 2 (two) times daily. 06/08/18  Yes Noel, Tiffany S, PA-C  Blood Glucose Monitoring Suppl (TRUE METRIX METER) DEVI 1 kit by Does not apply route 4 (four) times daily. 06/08/18   Brayton Caves, PA-C  glucose blood (TRUE METRIX BLOOD GLUCOSE TEST) test strip Use as instructed 06/08/18   Brayton Caves, PA-C  TRUEPLUS LANCETS 28G MISC 28 g by Does not apply route 4 (four) times daily. 06/08/18   Brayton Caves, PA-C    Inpatient Medications: Scheduled Meds: . furosemide  40 mg Intravenous Once   Continuous Infusions:  PRN Meds:   Allergies:    Allergies  Allergen Reactions  . Lisinopril Cough    Social History:   Social History   Socioeconomic History  . Marital status: Single    Spouse name: Not on file  . Number of children: Not on file  . Years of education: Not on file  . Highest education level: Not on file  Occupational History  . Not on file  Social Needs  . Financial resource strain: Not on file  . Food insecurity:    Worry: Not on file    Inability: Not on file  . Transportation needs:    Medical: Not on file    Non-medical: Not on file  Tobacco Use  . Smoking status: Never Smoker  . Smokeless tobacco: Never Used  Substance and Sexual Activity  . Alcohol use: Yes    Comment: occasionally  . Drug use: Never  . Sexual activity: Not  on file  Lifestyle  . Physical activity:    Days per week: Not on file    Minutes per session: Not on file  . Stress: Not on file  Relationships  . Social connections:    Talks on phone: Not on file    Gets together: Not on file    Attends religious service: Not on file    Active member of club or organization: Not on file    Attends meetings of clubs or organizations: Not on file    Relationship status: Not on file  . Intimate partner violence:    Fear of current or ex partner: Not on file    Emotionally abused: Not on file  Physically abused: Not on file    Forced sexual activity: Not on file  Other Topics Concern  . Not on file  Social History Narrative  . Not on file    Family History:   Family History  Problem Relation Age of Onset  . Diabetes Mother    Family Status:  Family Status  Relation Name Status  . Mother  Alive  . Father  Deceased    ROS:  Please see the history of present illness.  All other ROS reviewed and negative.     Physical Exam/Data:   Vitals:   06/24/18 1230 06/24/18 1245 06/24/18 1345 06/24/18 1400  BP: (!) 142/94 (!) 151/96 (!) 143/102 (!) 149/104  Pulse: 77 77 71 72  Resp: (!) 32 (!) 33    SpO2: 94% 90% 96% 92%   No intake or output data in the 24 hours ending 06/24/18 1617 There were no vitals filed for this visit. There is no height or weight on file to calculate BMI.   General: Overweight, NAD Skin: Warm, dry, intact  Head: Normocephalic, atraumatic, clear, moist mucus membranes. Neck: Negative for carotid bruits. No JVD Lungs: Crackles in bilateral upper and lower lobes. Breathing is mildly labored.  O2 saturations 88 to 90% on room air Cardiovascular: RRR with S1 S2. No murmurs, rubs, gallops, or LV heave appreciated. Abdomen: Soft, non-tender, non-distended with normoactive bowel sounds. No obvious abdominal masses. MSK: Strength and tone appear normal for age. 5/5 in all extremities Extremities: 2+ BLE edema to mid  shin. No clubbing or cyanosis. DP/PT pulses 1+ bilaterally Neuro: Alert and oriented. No focal deficits. No facial asymmetry. MAE spontaneously. Psych: Responds to questions appropriately with normal affect.    EKG:  The EKG was personally reviewed and demonstrates: 06/24/2018 NSR with no acute ischemic changes Telemetry:  Telemetry was personally reviewed and demonstrates: 06/24/2018 NSR HR 80s to 90s  Relevant CV Studies:  ECHO: 04/28/2018:  Study Conclusions  - Left ventricle: The cavity size was mildly dilated. Systolic   function was severely reduced. The estimated ejection fraction   was in the range of 25% to 30%. Moderate diffuse hypokinesis.   There was a reduced contribution of atrial contraction to   ventricular filling, due to increased ventricular diastolic   pressure or atrial contractile dysfunction. Doppler parameters   are consistent with a reversible restrictive pattern, indicative   of decreased left ventricular diastolic compliance and/or   increased left atrial pressure (grade 3 diastolic dysfunction).   Doppler parameters are consistent with high ventricular filling   pressure. - Aortic valve: Trileaflet; mildly thickened, mildly calcified   leaflets. - Mitral valve: Calcified annulus. There was mild regurgitation. - Left atrium: The atrium was severely dilated. - Pulmonary arteries: PA peak pressure: 66 mm Hg (S).  Impressions:  - The right ventricular systolic pressure was increased consistent   with moderate pulmonary hypertension.  CATH: None  Laboratory Data:  Chemistry Recent Labs  Lab 06/24/18 1305  NA 142  K 3.6  CL 104  CO2 26  GLUCOSE 138*  BUN 18  CREATININE 1.10*  CALCIUM 9.0  GFRNONAA 53*  GFRAA >60  ANIONGAP 12    Total Protein  Date Value Ref Range Status  04/27/2018 7.6 6.5 - 8.1 g/dL Final   Albumin  Date Value Ref Range Status  04/27/2018 3.7 3.5 - 5.0 g/dL Final   AST  Date Value Ref Range Status  04/27/2018 55  (H) 15 - 41 U/L Final  ALT  Date Value Ref Range Status  04/27/2018 59 (H) 0 - 44 U/L Final   Alkaline Phosphatase  Date Value Ref Range Status  04/27/2018 91 38 - 126 U/L Final   Total Bilirubin  Date Value Ref Range Status  04/27/2018 1.0 0.3 - 1.2 mg/dL Final   Hematology Recent Labs  Lab 06/24/18 1305  WBC 5.9  RBC 4.64  HGB 12.1  HCT 39.2  MCV 84.5  MCH 26.1  MCHC 30.9  RDW 17.0*  PLT 227   Cardiac Enzymes Recent Labs  Lab 06/24/18 1305  TROPONINI 0.03*   No results for input(s): TROPIPOC in the last 168 hours.  BNP Recent Labs  Lab 06/24/18 1305  BNP 1,177.4*    DDimer No results for input(s): DDIMER in the last 168 hours. TSH:  Lab Results  Component Value Date   TSH 1.927 04/27/2018   Lipids: Lab Results  Component Value Date   CHOL 202 (H) 04/27/2018   HDL 37 (L) 04/27/2018   LDLCALC 148 (H) 04/27/2018   TRIG 84 04/27/2018   CHOLHDL 5.5 04/27/2018   HgbA1c: Lab Results  Component Value Date   HGBA1C 7.8 (H) 04/27/2018    Radiology/Studies:  Dg Chest 2 View  Result Date: 06/24/2018 CLINICAL DATA:  Shortness of breath for 2 days EXAM: CHEST - 2 VIEW COMPARISON:  02/26/2009 FINDINGS: Cardiac shadow remains enlarged. Central vascular congestion is again identified with mild edema stable from the previous exam. No focal infiltrate or effusion is noted. No acute bony abnormality is seen. IMPRESSION: Mild vascular congestion and edema. Electronically Signed   By: Mark  Lukens M.D.   On: 06/24/2018 13:40   Assessment and Plan:   1.  Acute on chronic systolic CHF exacerbation: -Patient initially seen by Dr. Hilty on 05/25/2018 for acute on chronic systolic CHF exacerbation.  She had recently been hospitalized and was diuresed with IV diuretics.  Echocardiogram at that time showed an LVEF of 5 to 30% with moderate diffuse hypokinesis and G3 DD -In follow-up, she had complaints of persistent cough and her lisinopril was changed to losartan and she  was continued on ASA and statin. -He was continued on PO Lasix 40 mg twice daily.  She was seen 2 weeks later in the community health and wellness center for regular follow-up however she was short of breath and her Lasix was increased to 80 mg twice daily for 3 days then 40 mg twice daily with symptom improvement.  She presented once again to the clinic today and given her presentation she was sent to the emergency department for further evaluation -CXR with mild pulmonary edema -Trop, 0.03>> continue to trend however suspect this mild elevation is secondary to fluid volume overload -BNP elevated at 1177 -IV Lasix 40 mg twice daily was initiated however will likely require higher dosing -Weight at last office visit on 05/25/2018, 168lb.  Per patient report, she had a 6 pound weight gain in 2 weeks between community health and wellness visits -I&O, not yet recorded -Will need strict I&O and daily weights -Would start IV Lasix 40 mg twice daily and monitor renal function closely -Creatinine, 1.10 today with baseline of 1.0 -Continue ASA 81, atorvastatin 40  2.  Essential hypertension: -Elevated, 149/104, 143/102, 151/96 -Would start Toprol-XL 50 mg, home dose along with losartan 50 mg daily  3.  DM2: -Stable, hemoglobin A1c on 04/27/2018, 7.8 -Will need SSI for glucose control while inpatient status  For questions or updates, please contact [facility]   Please consult [email or web address] for contact info under Cardiology/STEMI.   SignedKathyrn Drown NP-C [facility] Pager: [phone] 06/24/2018 4:17 PM

## 2018-06-25 LAB — BASIC METABOLIC PANEL
Anion gap: 13 (ref 5–15)
BUN: 20 mg/dL (ref 8–23)
CO2: 25 mmol/L (ref 22–32)
Calcium: 9.2 mg/dL (ref 8.9–10.3)
Chloride: 103 mmol/L (ref 98–111)
Creatinine, Ser: 1.18 mg/dL — ABNORMAL HIGH (ref 0.44–1.00)
GFR calc Af Amer: 57 mL/min — ABNORMAL LOW (ref >60)
GFR calc non Af Amer: 49 mL/min — ABNORMAL LOW (ref >60)
Glucose, Bld: 141 mg/dL — ABNORMAL HIGH (ref 70–99)
Potassium: 4 mmol/L (ref 3.5–5.1)
Sodium: 141 mmol/L (ref 135–145)

## 2018-06-25 LAB — GLUCOSE, CAPILLARY
Glucose-Capillary: 126 mg/dL — ABNORMAL HIGH (ref 70–99)
Glucose-Capillary: 206 mg/dL — ABNORMAL HIGH (ref 70–99)
Glucose-Capillary: 169 mg/dL — ABNORMAL HIGH (ref 70–99)
Glucose-Capillary: 131 mg/dL — ABNORMAL HIGH (ref 70–99)

## 2018-06-25 LAB — TROPONIN I
TROPONIN I: 0.03 ng/mL — AB (ref <0.03)
Troponin I: 0.03 ng/mL (ref <0.03)

## 2018-06-25 MED ORDER — ALBUTEROL SULFATE (2.5 MG/3ML) 0.083% IN NEBU
2.5000 mg | INHALATION_SOLUTION | Freq: Four times a day (QID) | RESPIRATORY_TRACT | Status: DC | PRN
  Administered 2018-06-25: 2.5 mg via RESPIRATORY_TRACT
  Filled 2018-06-25: qty 3

## 2018-06-25 MED ORDER — HYDRALAZINE HCL 25 MG PO TABS
25.0000 mg | ORAL_TABLET | Freq: Three times a day (TID) | ORAL | Status: DC
  Administered 2018-06-25 – 2018-06-29 (×12): 25 mg via ORAL
  Filled 2018-06-25 (×15): qty 1

## 2018-06-25 MED ORDER — ALUM & MAG HYDROXIDE-SIMETH 200-200-20 MG/5ML PO SUSP
30.0000 mL | ORAL | Status: DC | PRN
  Administered 2018-06-25: 30 mL via ORAL
  Filled 2018-06-25: qty 30

## 2018-06-25 MED ORDER — FUROSEMIDE 10 MG/ML IJ SOLN
40.0000 mg | Freq: Three times a day (TID) | INTRAMUSCULAR | Status: DC
  Administered 2018-06-25 – 2018-06-28 (×10): 40 mg via INTRAVENOUS
  Filled 2018-06-25 (×10): qty 4

## 2018-06-25 NOTE — Progress Notes (Signed)
Patient is complaining of SOB, no PRN to give, requesting breathing treatment. Paged MD on call

## 2018-06-25 NOTE — Care Management Note (Signed)
Case Management Note  Patient Details  Name: Tracey Gallegos MRN: [id number] Date of Birth: November 10, 1955  Subjective/Objective:   CHF               Action/Plan: Patient is independent of all of her ADL's; goes to the [facility] for medical care and as her pharmacy; she has applied for Medicaid, application is pending; no DME at her home; CM will continue to follow for progression of care.  Expected Discharge Date:    possibly 06/29/2018              Expected Discharge Plan:  Home/Self Care  In-House Referral:   Financial Counselor  Discharge planning Services  CM Consult  Status of Service:  In process, will continue to follow  Sherrilyn Rist [phone] 06/25/2018, 10:37 AM

## 2018-06-25 NOTE — Progress Notes (Signed)
Notified RT about breathing treatment.On her way to administer to the pt.   Sourish Allender, RN

## 2018-06-25 NOTE — Evaluation (Signed)
Physical Therapy Evaluation Patient Details Name: Tracey Gallegos MRN: [id number] DOB: 1956/03/23 Today's Date: 06/25/2018   History of Present Illness  62 y.o.femalewith a hx of chronic combined congestive heart failure(2006),DM 2, hypertension, and hyperlipidemiawho is being followed by cardiology for the evaluation ofacute on chronic combined CHF   Clinical Impression  Pt admitted with above diagnosis. Pt currently with functional limitations due to the deficits listed below (see PT Problem List). Pt was able to ambulate in room with good stability but gets SOB with DOE 3/4 with short distance.  Discussed purse lip breathing, energy conservation and Exercising with CHF giving pt educational handouts as well regarding this info.  Pt appreciative and should progress with treatment.  Will follow acutely.   Pt will benefit from skilled PT to increase their independence and safety with mobility to allow discharge to the venue listed below.      Follow Up Recommendations No PT follow up    Equipment Recommendations  Other (comment)(tub bench, may want a bariatric rollator - pt thinking about)    Recommendations for Other Services       Precautions / Restrictions Precautions Precautions: None Restrictions Weight Bearing Restrictions: No      Mobility  Bed Mobility Overal bed mobility: Independent                Transfers Overall transfer level: Independent                  Ambulation/Gait Ambulation/Gait assistance: Supervision;Min guard Gait Distance (Feet): 30 Feet Assistive device: None Gait Pattern/deviations: Step-through pattern;Decreased stride length   Gait velocity interpretation: <1.31 ft/sec, indicative of household ambulator General Gait Details: Pt ambulated to bathroom and back.  Significantly winded with DOE 3/4 after this short walk. Pt with cues for pursed lip breathing as she gets dysneic with short distances.  discussed energy conservation and  gave pt handout.  Also discussed that pt begin a walking program - work up to 30 min 3 -5 x a week.  Gave hand out for this as well.   Stairs            Wheelchair Mobility    Modified Rankin (Stroke Patients Only)       Balance Overall balance assessment: No apparent balance deficits (not formally assessed)                                           Pertinent Vitals/Pain Pain Assessment: No/denies pain    Home Living Family/patient expects to be discharged to:: Private residence Living Arrangements: Alone   Type of Home: House Home Access: Stairs to enter Entrance Stairs-Rails: None Entrance Stairs-Number of Steps: 1 Home Layout: One level Home Equipment: None      Prior Function Level of Independence: Independent         Comments: Drives     Hand Dominance   Dominant Hand: Right    Extremity/Trunk Assessment   Upper Extremity Assessment Upper Extremity Assessment: Defer to OT evaluation    Lower Extremity Assessment Lower Extremity Assessment: Generalized weakness    Cervical / Trunk Assessment Cervical / Trunk Assessment: Normal  Communication   Communication: No difficulties  Cognition Arousal/Alertness: Awake/alert Behavior During Therapy: WFL for tasks assessed/performed Overall Cognitive Status: Within Functional Limits for tasks assessed  General Comments General comments (skin integrity, edema, etc.): Notable edema LEs and UEs.  Pt desat to 88% on RA after walk but did recover.    Exercises     Assessment/Plan    PT Assessment Patient needs continued PT services  PT Problem List Decreased activity tolerance;Decreased balance;Decreased mobility;Decreased knowledge of use of DME;Decreased safety awareness;Decreased knowledge of precautions;Cardiopulmonary status limiting activity;Obesity       PT Treatment Interventions DME instruction;Gait  training;Functional mobility training;Therapeutic activities;Therapeutic exercise;Balance training;Patient/family education    PT Goals (Current goals can be found in the Care Plan section)  Acute Rehab PT Goals Patient Stated Goal: to go home PT Goal Formulation: With patient Time For Goal Achievement: 07/09/18 Potential to Achieve Goals: Good    Frequency Min 3X/week   Barriers to discharge Decreased caregiver support      Co-evaluation               AM-PAC PT "6 Clicks" Mobility  Outcome Measure Help needed turning from your back to your side while in a flat bed without using bedrails?: None Help needed moving from lying on your back to sitting on the side of a flat bed without using bedrails?: None Help needed moving to and from a bed to a chair (including a wheelchair)?: None Help needed standing up from a chair using your arms (e.g., wheelchair or bedside chair)?: None Help needed to walk in hospital room?: A Little Help needed climbing 3-5 steps with a railing? : A Little 6 Click Score: 22    End of Session Equipment Utilized During Treatment: Gait belt Activity Tolerance: Patient limited by fatigue Patient left: in bed;with call bell/phone within reach;with family/visitor present(sitting EOB) Nurse Communication: Mobility status PT Visit Diagnosis: Muscle weakness (generalized) (M62.81);Other abnormalities of gait and mobility (R26.89)    Time: 9702-6378 PT Time Calculation (min) (ACUTE ONLY): 15 min   Charges:   PT Evaluation $PT Eval Moderate Complexity: [place] Pager:  [phone]  Office:  [phone]    Denice Paradise 06/25/2018, 2:02 PM

## 2018-06-25 NOTE — Progress Notes (Signed)
PROGRESS NOTE    Tracey Gallegos  TIW:[id number] DOB: [birth year] DOA: 06/24/2018 PCP: Ladell Pier, MD  Brief Narrative:62 y.o. female with medical history significant of chronic systolic heart failure (EF 25-30% by TTE on 04/28/2018), DM-2, hypertension presenting with the above-noted complaints.  Per patient for the past 1 month she is noticed shortness of breath-mostly on exertion.  However over the past few days-this is gradually worsened-she is now short of breath even walking a few feet.  She has now started to develop shortness of breath at rest-and has also developed orthopnea for the past few nights.  She has noted at least a 6 pound weight gain since he last saw her physician.  She also has noted worsening lower extremity edema.  Due to worsening of the symptoms-she presented to the ED-she was thought to have decompensated systolic heart failure and referred to the [facility] for further evaluation and treatment.  Denies any fever, headache, chest pain, abdominal pain, nausea, vomiting or diarrhea.  She denies any dysuria or hematuria  Assessment & Plan:   Principal Problem:   Acute systolic (congestive) heart failure (HCC) Active Problems:   Type 2 diabetes mellitus without complication, without long-term current use of insulin (HCC)   HTN (hypertension)   Morbid obesity ([place])    #1 acute on chronic systolic heart failure ejection fraction 25 to 30% by echo 04/28/2018-patient known to me from previous admission at [facility] long for the same.  She reports she is very compliant to medications as well as diet.  However she reports she is not able to do much of activity at home.  She does not move around at home much due to increasing shortness of breath.  She lives alone.  Cardiology following noted that Lasix dose is increased to 40 mg 3 times a day and may be possible cath on Monday.  Patient very concerned about not getting better.  Monitor renal functions.  #2  hypertension continue metoprolol and ACE inhibitor.  #3 type 2 diabetes continue SSI for now.  Oral hypoglycemics are on hold.  #4 hyperlipidemia LDL 148 continue statin.       Estimated body mass index is 41.37 kg/m as calculated from the following:   Height as of this encounter: 5\' 8"  (1.727 m).   Weight as of this encounter: 123.4 kg.  DVT prophylaxis: Lovenox Code Status: Full code  Family Communication: No family available Disposition Plan: Pending clinical improvement patient possibly going for cath on Monday she remains fluid overloaded and severe dyspnea on exertion and is at high risk for cardiac decompensation.  Consultants: Cardiology  Procedures: None Antimicrobials: None  Subjective: Continues to feel short of breath with minimal exertion concerned why she is not getting better Objective: Vitals:   06/25/18 0346 06/25/18 0838 06/25/18 1200 06/25/18 1230  BP: 135/86 (!) 150/110  (!) 142/91  Pulse: 77 81  78  Resp: 18 18  18   Temp: 97.7 F (36.5 C)   (!) 97.4 F (36.3 C)  TempSrc: Oral   Oral  SpO2: 91% 94% 95% 91%  Weight: 123.4 kg     Height:        Intake/Output Summary (Last 24 hours) at 06/25/2018 1345 Last data filed at 06/25/2018 1245 Gross per 24 hour  Intake 600 ml  Output 2700 ml  Net -2100 ml   Filed Weights   06/24/18 1712 06/25/18 0346  Weight: 124.3 kg 123.4 kg    Examination:  General exam: Appears calm and  comfortable  Respiratory system: Creased breath sounds at the bases to auscultation. Respiratory effort normal. Cardiovascular system: S1 & S2 heard, RRR. No JVD, murmurs, rubs, gallops or clicks. No pedal edema. Gastrointestinal system: Abdomen is nondistended, soft and nontender. No organomegaly or masses felt. Normal bowel sounds heard. Central nervous system: Alert and oriented. No focal neurological deficits. Extremities: 2+ pitting edema Skin: No rashes, lesions or ulcers Psychiatry: Judgement and insight appear normal.  Mood & affect appropriate.     Data Reviewed: I have personally reviewed following labs and imaging studies  CBC: Recent Labs  Lab 06/24/18 1305  WBC 5.9  NEUTROABS 2.8  HGB 12.1  HCT 39.2  MCV 84.5  PLT 378   Basic Metabolic Panel: Recent Labs  Lab 06/24/18 1305 06/25/18 0445  NA 142 141  K 3.6 4.0  CL 104 103  CO2 26 25  GLUCOSE 138* 141*  BUN 18 20  CREATININE 1.10* 1.18*  CALCIUM 9.0 9.2   GFR: Estimated Creatinine Clearance: 67.6 mL/min (A) (by C-G formula based on SCr of 1.18 mg/dL (H)). Liver Function Tests: No results for input(s): AST, ALT, ALKPHOS, BILITOT, PROT, ALBUMIN in the last 168 hours. No results for input(s): LIPASE, AMYLASE in the last 168 hours. No results for input(s): AMMONIA in the last 168 hours. Coagulation Profile: No results for input(s): INR, PROTIME in the last 168 hours. Cardiac Enzymes: Recent Labs  Lab 06/24/18 1305 06/24/18 1802 06/24/18 2311 06/25/18 0445  TROPONINI 0.03* 0.03* 0.03* 0.03*   BNP (last 3 results) No results for input(s): PROBNP in the last 8760 hours. HbA1C: No results for input(s): HGBA1C in the last 72 hours. CBG: Recent Labs  Lab 06/24/18 1732 06/24/18 2103 06/25/18 0756 06/25/18 1227  GLUCAP 140* 175* 126* 206*   Lipid Profile: No results for input(s): CHOL, HDL, LDLCALC, TRIG, CHOLHDL, LDLDIRECT in the last 72 hours. Thyroid Function Tests: No results for input(s): TSH, T4TOTAL, FREET4, T3FREE, THYROIDAB in the last 72 hours. Anemia Panel: No results for input(s): VITAMINB12, FOLATE, FERRITIN, TIBC, IRON, RETICCTPCT in the last 72 hours. Sepsis Labs: No results for input(s): PROCALCITON, LATICACIDVEN in the last 168 hours.  No results found for this or any previous visit (from the past 240 hour(s)).       Radiology Studies: Dg Chest 2 View  Result Date: 06/24/2018 CLINICAL DATA:  Shortness of breath for 2 days EXAM: CHEST - 2 VIEW COMPARISON:  02/26/2009 FINDINGS: Cardiac shadow  remains enlarged. Central vascular congestion is again identified with mild edema stable from the previous exam. No focal infiltrate or effusion is noted. No acute bony abnormality is seen. IMPRESSION: Mild vascular congestion and edema. Electronically Signed   By: Inez Catalina M.D.   On: 06/24/2018 13:40        Scheduled Meds: . aspirin EC  81 mg Oral Daily  . atorvastatin  40 mg Oral q1800  . enoxaparin (LOVENOX) injection  40 mg Subcutaneous Q24H  . furosemide  40 mg Intravenous TID  . hydrALAZINE  25 mg Oral Q8H  . insulin aspart  0-9 Units Subcutaneous TID WC  . losartan  50 mg Oral Daily  . metoprolol succinate  50 mg Oral Daily  . potassium chloride  40 mEq Oral Daily  . sodium chloride flush  3 mL Intravenous Q12H   Continuous Infusions: . sodium chloride       LOS: 1 day     Georgette Shell, MD [facility]  If 7PM-7AM, please contact night-coverage [email or web address]  Password [email or web address] 06/25/2018, 1:45 PM

## 2018-06-25 NOTE — Progress Notes (Signed)
Progress Note  Patient Name: Tracey Gallegos Date of Encounter: 06/25/2018  Primary Cardiologist: Pixie Casino, MD   Subjective   Still feeling quite short of breath. No complaints of chest pain or palpitations.   Inpatient Medications    Scheduled Meds: . aspirin EC  81 mg Oral Daily  . atorvastatin  40 mg Oral q1800  . enoxaparin (LOVENOX) injection  40 mg Subcutaneous Q24H  . furosemide  40 mg Intravenous TID  . hydrALAZINE  25 mg Oral Q8H  . insulin aspart  0-9 Units Subcutaneous TID WC  . losartan  50 mg Oral Daily  . metoprolol succinate  50 mg Oral Daily  . potassium chloride  40 mEq Oral Daily  . sodium chloride flush  3 mL Intravenous Q12H   Continuous Infusions: . sodium chloride     PRN Meds: sodium chloride, acetaminophen, albuterol, alum & mag hydroxide-simeth, ondansetron (ZOFRAN) IV, sodium chloride flush   Vital Signs    Vitals:   06/24/18 2350 06/25/18 0346 06/25/18 0838 06/25/18 1200  BP: (!) 159/109 135/86 (!) 150/110   Pulse: 75 77 81   Resp: 18 18 18    Temp: 97.7 F (36.5 C) 97.7 F (36.5 C)    TempSrc: Oral Oral    SpO2: 91% 91% 94% 95%  Weight:  123.4 kg    Height:        Intake/Output Summary (Last 24 hours) at 06/25/2018 1211 Last data filed at 06/25/2018 0945 Gross per 24 hour  Intake 600 ml  Output 1800 ml  Net -1200 ml   Filed Weights   06/24/18 1712 06/25/18 0346  Weight: 124.3 kg 123.4 kg    Telemetry    NSR - Personally Reviewed  Physical Exam   GEN: sitting on the edge of her hospital bed in NAD.   Neck: No JVD, no carotid bruits Cardiac: RRR, no murmurs, rubs, or gallops.  Respiratory: Clear to auscultation bilaterally, no wheezes/ rales/ rhonchi. She is tachypneic and has frequent coughing GI: NABS, Soft, nontender, non-distended  MS: 2-3+ LE edema; No deformity. Neuro:  Nonfocal, moving all extremities spontaneously Psych: Normal affect   Labs    Chemistry Recent Labs  Lab 06/24/18 1305 06/25/18 0445    NA 142 141  K 3.6 4.0  CL 104 103  CO2 26 25  GLUCOSE 138* 141*  BUN 18 20  CREATININE 1.10* 1.18*  CALCIUM 9.0 9.2  GFRNONAA 53* 49*  GFRAA >60 57*  ANIONGAP 12 13     Hematology Recent Labs  Lab 06/24/18 1305  WBC 5.9  RBC 4.64  HGB 12.1  HCT 39.2  MCV 84.5  MCH 26.1  MCHC 30.9  RDW 17.0*  PLT 227    Cardiac Enzymes Recent Labs  Lab 06/24/18 1305 06/24/18 1802 06/24/18 2311 06/25/18 0445  TROPONINI 0.03* 0.03* 0.03* 0.03*   No results for input(s): TROPIPOC in the last 168 hours.   BNP Recent Labs  Lab 06/24/18 1305  BNP 1,177.4*     DDimer No results for input(s): DDIMER in the last 168 hours.   Radiology    Dg Chest 2 View  Result Date: 06/24/2018 CLINICAL DATA:  Shortness of breath for 2 days EXAM: CHEST - 2 VIEW COMPARISON:  02/26/2009 FINDINGS: Cardiac shadow remains enlarged. Central vascular congestion is again identified with mild edema stable from the previous exam. No focal infiltrate or effusion is noted. No acute bony abnormality is seen. IMPRESSION: Mild vascular congestion and edema. Electronically Signed  By: Inez Catalina M.D.   On: 06/24/2018 13:40    Cardiac Studies   ECHO: 04/28/2018:  Study Conclusions  - Left ventricle: The cavity size was mildly dilated. Systolic function was severely reduced. The estimated ejection fraction was in the range of 25% to 30%. Moderate diffuse hypokinesis. There was a reduced contribution of atrial contraction to ventricular filling, due to increased ventricular diastolic pressure or atrial contractile dysfunction. Doppler parameters are consistent with a reversible restrictive pattern, indicative of decreased left ventricular diastolic compliance and/or increased left atrial pressure (grade 3 diastolic dysfunction). Doppler parameters are consistent with high ventricular filling pressure. - Aortic valve: Trileaflet; mildly thickened, mildly calcified leaflets. -  Mitral valve: Calcified annulus. There was mild regurgitation. - Left atrium: The atrium was severely dilated. - Pulmonary arteries: PA peak pressure: 66 mm Hg (S).  Impressions:  - The right ventricular systolic pressure was increased consistent with moderate pulmonary hypertension.   Patient Profile     62 y.o. female with a hx of chronic combined congestive heart failure (2006), DM 2, hypertension, and hyperlipidemia who is being followed by cardiology for the evaluation of acute on chronic combined CHF   Assessment & Plan    1. Acute on chronic combined CHF: patient presented with SOB despite multiple outpatient titrations of po lasix. Echo 04/2018 with EF 25-30% and G3DD. BNP elevated to 1177. CXR with mild pulmonary edema. She was started on IV lasix 40mg  BID with UOP net -1.0L this admission. Weight 274lbs>272lbs. Cr stable at 1.18 today. Lungs relative clear but still with 2+ LE edema and significant SOB. - Will increase IV lasix to 40mg  TID - Continue to monitor strict I&Os and daily weights. - Continue to monitor electrolytes and replete as needed to maintain K>4, Mg >2 - Will likely need R/LHC to evaluate cardiac output and ischemic cause once volume status is optimized - will plan for Monday. The patient understands that risks included but are not limited to stroke (1 in 1000), death (1 in 31), kidney failure [usually temporary] (1 in 500), bleeding (1 in 200), allergic reaction [possibly serious] (1 in 200).  The patient understands and agrees to proceed.  - Continue metoprolol succinate and losartan  2. HTN: remains poorly controlled.  - Will start hydralazine 25mg  TID for better BP control - Continue metoprolol succinate and losartan  3. DM type 2: Hgb 7.9 04/2018; goal <7 - Continue management per primary team  4. HLD: LDL 148 04/2018 - Continue statin  For questions or updates, please contact [place] Please consult [email or web address] for contact info under  Cardiology/STEMI.      Signed, Abigail Butts, PA-C  06/25/2018, 12:11 PM   [phone]

## 2018-06-26 LAB — BASIC METABOLIC PANEL
Anion gap: 12 (ref 5–15)
BUN: 19 mg/dL (ref 8–23)
CO2: 27 mmol/L (ref 22–32)
Calcium: 8.9 mg/dL (ref 8.9–10.3)
Chloride: 102 mmol/L (ref 98–111)
Creatinine, Ser: 1.11 mg/dL — ABNORMAL HIGH (ref 0.44–1.00)
GFR calc Af Amer: >60 mL/min (ref >60)
GFR calc non Af Amer: 53 mL/min — ABNORMAL LOW (ref >60)
Glucose, Bld: 130 mg/dL — ABNORMAL HIGH (ref 70–99)
POTASSIUM: 3.2 mmol/L — AB (ref 3.5–5.1)
Sodium: 141 mmol/L (ref 135–145)

## 2018-06-26 LAB — GLUCOSE, CAPILLARY
GLUCOSE-CAPILLARY: 123 mg/dL — AB (ref 70–99)
GLUCOSE-CAPILLARY: 154 mg/dL — AB (ref 70–99)
Glucose-Capillary: 167 mg/dL — ABNORMAL HIGH (ref 70–99)
Glucose-Capillary: 136 mg/dL — ABNORMAL HIGH (ref 70–99)

## 2018-06-26 NOTE — Progress Notes (Signed)
Progress Note  Patient Name: Tracey Gallegos Date of Encounter: 06/26/2018  Primary Cardiologist: Pixie Casino, MD  Subjective  Doing well, still feeling short of breath with minimal exertion like walking to bathroom. Discussed plans for cath, heart failure management.  Inpatient Medications    Scheduled Meds: . aspirin EC  81 mg Oral Daily  . atorvastatin  40 mg Oral q1800  . enoxaparin (LOVENOX) injection  40 mg Subcutaneous Q24H  . furosemide  40 mg Intravenous TID  . hydrALAZINE  25 mg Oral Q8H  . insulin aspart  0-9 Units Subcutaneous TID WC  . losartan  50 mg Oral Daily  . metoprolol succinate  50 mg Oral Daily  . potassium chloride  40 mEq Oral Daily  . sodium chloride flush  3 mL Intravenous Q12H   Continuous Infusions: . sodium chloride     PRN Meds: sodium chloride, acetaminophen, albuterol, alum & mag hydroxide-simeth, ondansetron (ZOFRAN) IV, sodium chloride flush   Vital Signs    Vitals:   06/25/18 1230 06/25/18 2117 06/26/18 0009 06/26/18 0532  BP: (!) 142/91 (!) 137/96 (!) 145/84 (!) 155/101  Pulse: 78 76 79 84  Resp: 18 16 20 20   Temp: (!) 97.4 F (36.3 C) 97.7 F (36.5 C) (!) 97.5 F (36.4 C) 97.8 F (36.6 C)  TempSrc: Oral Oral Oral Oral  SpO2: 91% 91% 90% 94%  Weight:    121.7 kg  Height:        Intake/Output Summary (Last 24 hours) at 06/26/2018 0759 Last data filed at 06/26/2018 1761 Gross per 24 hour  Intake 1080 ml  Output 3900 ml  Net -2820 ml   Filed Weights   06/24/18 1712 06/25/18 0346 06/26/18 0532  Weight: 124.3 kg 123.4 kg 121.7 kg    Telemetry    Sinus rhythm with PVCs and two brief runs of NSVT - Personally Reviewed  ECG    None since 12/5 - Personally Reviewed  Physical Exam   GEN: No acute distress.   Neck: No JVD appreciated sitting up at 90 degrees Cardiac: RRR, no murmurs, rubs, or gallops.  Respiratory: Decreased breath sounds, rales bilaterally. GI: Soft, nontender, non-distended  MS: Trace-1+ bilateral  LE edema; No deformity. Neuro:  Nonfocal  Psych: Normal affect   Labs    Chemistry Recent Labs  Lab 06/24/18 1305 06/25/18 0445 06/26/18 0521  NA 142 141 141  K 3.6 4.0 3.2*  CL 104 103 102  CO2 26 25 27   GLUCOSE 138* 141* 130*  BUN 18 20 19   CREATININE 1.10* 1.18* 1.11*  CALCIUM 9.0 9.2 8.9  GFRNONAA 53* 49* 53*  GFRAA >60 57* >60  ANIONGAP 12 13 12      Hematology Recent Labs  Lab 06/24/18 1305  WBC 5.9  RBC 4.64  HGB 12.1  HCT 39.2  MCV 84.5  MCH 26.1  MCHC 30.9  RDW 17.0*  PLT 227    Cardiac Enzymes Recent Labs  Lab 06/24/18 1305 06/24/18 1802 06/24/18 2311 06/25/18 0445  TROPONINI 0.03* 0.03* 0.03* 0.03*   No results for input(s): TROPIPOC in the last 168 hours.   BNP Recent Labs  Lab 06/24/18 1305  BNP 1,177.4*     DDimer No results for input(s): DDIMER in the last 168 hours.   Radiology    Dg Chest 2 View  Result Date: 06/24/2018 CLINICAL DATA:  Shortness of breath for 2 days EXAM: CHEST - 2 VIEW COMPARISON:  02/26/2009 FINDINGS: Cardiac shadow remains enlarged. Central vascular congestion  is again identified with mild edema stable from the previous exam. No focal infiltrate or effusion is noted. No acute bony abnormality is seen. IMPRESSION: Mild vascular congestion and edema. Electronically Signed   By: Inez Catalina M.D.   On: 06/24/2018 13:40    Cardiac Studies   Echo 04/28/18 Study Conclusions  - Left ventricle: The cavity size was mildly dilated. Systolic   function was severely reduced. The estimated ejection fraction   was in the range of 25% to 30%. Moderate diffuse hypokinesis.   There was a reduced contribution of atrial contraction to   ventricular filling, due to increased ventricular diastolic   pressure or atrial contractile dysfunction. Doppler parameters   are consistent with a reversible restrictive pattern, indicative   of decreased left ventricular diastolic compliance and/or   increased left atrial pressure  (grade 3 diastolic dysfunction).   Doppler parameters are consistent with high ventricular filling   pressure. - Aortic valve: Trileaflet; mildly thickened, mildly calcified   leaflets. - Mitral valve: Calcified annulus. There was mild regurgitation. - Left atrium: The atrium was severely dilated. - Pulmonary arteries: PA peak pressure: 66 mm Hg (S).  Impressions:  - The right ventricular systolic pressure was increased consistent   with moderate pulmonary hypertension.  Patient Profile     62 y.o. female admitted for acute on chronic systolic congestive heart failure, NYHA class 3 symptoms.  Assessment & Plan    1.  Acute on chronic systolic CHF exacerbation: Recent EF 25-30%, presenting with worsening shortness of breath, weight gain, NYHA class 3 symptoms -Trop, 0.03>> flat. Not consistent with ACS, likely due to acute heart failure. -IV Lasix 40 mg, currently 3 times daily  -Since 12/5, net negative 3.8 L, weight from 124.3 kg to 121.7 kg -reports that she can lay nearly flat in bed (in anticipation of cath) -strict I&O and daily weights, fluid restriction, low sodium diet -Creatinine, 1.11 today with baseline of 1.0 -Continue ASA 81, atorvastatin 40 -supplement K, aim for >4 given NSVT on monitors -plan for [facility] on 06/28/18, orders in, NPO at midnight 12/8. Risks and benefits of cardiac catheterization have been discussed with the patient.  These include bleeding, infection, kidney damage, stroke, heart attack, death.  The patient understands these risks and is willing to proceed.  2.  Essential hypertension: -Remains above goal -Continue Toprol-XL 50 mg, losartan 50 mg daily -hydralazine ordered overnight. If renal function remains stable with diuresis post cath, would prefer to change to entresto prior to discharge and uptitrate  3.  DM2: -Stable, hemoglobin A1c on 04/27/2018, 7.8 -Management per primary team  TIME SPENT WITH PATIENT: 25 minutes of direct patient  care. More than 50% of that time was spent on coordination of care and counseling regarding heart failure management, discussion of cath.  For questions or updates, please contact [place] Please consult [email or web address] for contact info under     Signed, Buford Dresser, MD  06/26/2018, 7:59 AM

## 2018-06-26 NOTE — Progress Notes (Signed)
Telemetry called RN to inform her of pt having 11 beats of V-tach; pt asymptomatic and sitting comfortably in bed watching TV. Pt denies any pain or discomfort in chest. Dr Rodena Piety notified and ordered to make a note and continue to monitor. Now new orders received. Will continue to closely monitor pt. Delia Heady RN

## 2018-06-26 NOTE — Progress Notes (Signed)
PROGRESS NOTE    Tracey Gallegos  GYI:[id number] DOB: Mar 15, 1956 DOA: 06/24/2018 PCP: Ladell Pier, MD  Brief Narrative: 62 y.o.femalewith medical history significant ofchronic systolic heart failure (EF 25-30% by TTE on 04/28/2018), DM-2, hypertension presenting with the above-noted complaints. Per patient for the past 1 month she is noticed shortness of breath-mostly on exertion. However over the past few days-this is gradually worsened-she is now short of breath even walking a few feet. She has now started to develop shortness of breath at rest-and has also developed orthopnea for the past few nights. She has noted at least a 6 pound weight gain since he last saw her physician. She also has noted worsening lower extremity edema. Due to worsening of the symptoms-she presented to the ED-she was thought to have decompensated systolic heart failure and referred to the [facility] for further evaluation and treatment.  Denies any fever, headache, chest pain, abdominal pain, nausea, vomiting or diarrhea. She denies any dysuria or hematuria Assessment & Plan:   Principal Problem:   Acute systolic (congestive) heart failure (HCC) Active Problems:   Type 2 diabetes mellitus without complication, without long-term current use of insulin (HCC)   HTN (hypertension)   Morbid obesity ([place])  #1 acute on chronic systolic heart failure ejection fraction 25 to 30% by echo 04/28/2018-patient known to me from previous admission at [facility] long for the same.  She reports she is very compliant to medications as well as diet.  However she reports she is not able to do much of activity at home.  She does not move around at home much due to increasing shortness of breath.  She lives alone.  Cardiology following noted that Lasix dose is increased to 40 mg 3 times a day and may be possible cath on Monday.  Patient very concerned about not getting better.  Monitor renal functions.  Continue diuresis  per cardiology.  Cardiology thinking about Entresto prior to DC if renal function stable after cath.  #2 hypertension continue metoprolol and ACE inhibitor.  #3 type 2 diabetes continue SSI for now.  Oral hypoglycemics are on hold.  #4 hyperlipidemia LDL 148 continue statin.  #5 hypokalemia being repleted.    Estimated body mass index is 40.81 kg/m as calculated from the following:   Height as of this encounter: 5\' 8"  (1.727 m).   Weight as of this encounter: 121.7 kg.  DVT prophylaxis: Lovenox Code Status: Full code Family Communication: None Disposition Plan: Pending cardiac cath Monday  Consultants: Cardiology Procedures: None Antimicrobials: None  Subjective: Continues to complain of shortness of breath and dyspnea on exertions though slightly better.   Objective: Vitals:   06/26/18 0009 06/26/18 0532 06/26/18 0831 06/26/18 1129  BP: (!) 145/84 (!) 155/101 (!) 137/97 127/78  Pulse: 79 84 76 86  Resp: 20 20  18   Temp: (!) 97.5 F (36.4 C) 97.8 F (36.6 C)    TempSrc: Oral Oral    SpO2: 90% 94%  98%  Weight:  121.7 kg    Height:        Intake/Output Summary (Last 24 hours) at 06/26/2018 1229 Last data filed at 06/26/2018 1133 Gross per 24 hour  Intake 960 ml  Output 3700 ml  Net -2740 ml   Filed Weights   06/24/18 1712 06/25/18 0346 06/26/18 0532  Weight: 124.3 kg 123.4 kg 121.7 kg    Examination:  General exam: Appears calm and comfortable  Respiratory system: Crackles bases to auscultation. Respiratory effort normal. Cardiovascular system: S1 &  S2 heard, RRR. No JVD, murmurs, rubs, gallops or clicks. No pedal edema. Gastrointestinal system: Abdomen is nondistended, soft and nontender. No organomegaly or masses felt. Normal bowel sounds heard. Central nervous system: Alert and oriented. No focal neurological deficits. Extremities: Symmetric 5 x 5 power. Skin: No rashes, lesions or ulcers Psychiatry: Judgement and insight appear normal. Mood & affect  appropriate.     Data Reviewed: I have personally reviewed following labs and imaging studies  CBC: Recent Labs  Lab 06/24/18 1305  WBC 5.9  NEUTROABS 2.8  HGB 12.1  HCT 39.2  MCV 84.5  PLT 182   Basic Metabolic Panel: Recent Labs  Lab 06/24/18 1305 06/25/18 0445 06/26/18 0521  NA 142 141 141  K 3.6 4.0 3.2*  CL 104 103 102  CO2 26 25 27   GLUCOSE 138* 141* 130*  BUN 18 20 19   CREATININE 1.10* 1.18* 1.11*  CALCIUM 9.0 9.2 8.9   GFR: Estimated Creatinine Clearance: 71.2 mL/min (A) (by C-G formula based on SCr of 1.11 mg/dL (H)). Liver Function Tests: No results for input(s): AST, ALT, ALKPHOS, BILITOT, PROT, ALBUMIN in the last 168 hours. No results for input(s): LIPASE, AMYLASE in the last 168 hours. No results for input(s): AMMONIA in the last 168 hours. Coagulation Profile: No results for input(s): INR, PROTIME in the last 168 hours. Cardiac Enzymes: Recent Labs  Lab 06/24/18 1305 06/24/18 1802 06/24/18 2311 06/25/18 0445  TROPONINI 0.03* 0.03* 0.03* 0.03*   BNP (last 3 results) No results for input(s): PROBNP in the last 8760 hours. HbA1C: No results for input(s): HGBA1C in the last 72 hours. CBG: Recent Labs  Lab 06/25/18 1227 06/25/18 1708 06/25/18 2118 06/26/18 0732 06/26/18 1128  GLUCAP 206* 169* 131* 123* 167*   Lipid Profile: No results for input(s): CHOL, HDL, LDLCALC, TRIG, CHOLHDL, LDLDIRECT in the last 72 hours. Thyroid Function Tests: No results for input(s): TSH, T4TOTAL, FREET4, T3FREE, THYROIDAB in the last 72 hours. Anemia Panel: No results for input(s): VITAMINB12, FOLATE, FERRITIN, TIBC, IRON, RETICCTPCT in the last 72 hours. Sepsis Labs: No results for input(s): PROCALCITON, LATICACIDVEN in the last 168 hours.  No results found for this or any previous visit (from the past 240 hour(s)).       Radiology Studies: Dg Chest 2 View  Result Date: 06/24/2018 CLINICAL DATA:  Shortness of breath for 2 days EXAM: CHEST - 2  VIEW COMPARISON:  02/26/2009 FINDINGS: Cardiac shadow remains enlarged. Central vascular congestion is again identified with mild edema stable from the previous exam. No focal infiltrate or effusion is noted. No acute bony abnormality is seen. IMPRESSION: Mild vascular congestion and edema. Electronically Signed   By: Inez Catalina M.D.   On: 06/24/2018 13:40        Scheduled Meds: . aspirin EC  81 mg Oral Daily  . atorvastatin  40 mg Oral q1800  . enoxaparin (LOVENOX) injection  40 mg Subcutaneous Q24H  . furosemide  40 mg Intravenous TID  . hydrALAZINE  25 mg Oral Q8H  . insulin aspart  0-9 Units Subcutaneous TID WC  . losartan  50 mg Oral Daily  . metoprolol succinate  50 mg Oral Daily  . potassium chloride  40 mEq Oral Daily  . sodium chloride flush  3 mL Intravenous Q12H   Continuous Infusions: . sodium chloride       LOS: 2 days     Georgette Shell, MD [facility]  If 7PM-7AM, please contact night-coverage [email or web address] Password [facility] 06/26/2018, 12:29  PM  

## 2018-06-27 DIAGNOSIS — I5021 Acute systolic (congestive) heart failure: Secondary | ICD-10-CM

## 2018-06-27 LAB — BASIC METABOLIC PANEL
Anion gap: 11 (ref 5–15)
BUN: 19 mg/dL (ref 8–23)
CHLORIDE: 103 mmol/L (ref 98–111)
CO2: 28 mmol/L (ref 22–32)
Calcium: 9.1 mg/dL (ref 8.9–10.3)
Creatinine, Ser: 1.11 mg/dL — ABNORMAL HIGH (ref 0.44–1.00)
GFR calc Af Amer: >60 mL/min (ref >60)
GFR calc non Af Amer: 53 mL/min — ABNORMAL LOW (ref >60)
Glucose, Bld: 133 mg/dL — ABNORMAL HIGH (ref 70–99)
Potassium: 3.5 mmol/L (ref 3.5–5.1)
Sodium: 142 mmol/L (ref 135–145)

## 2018-06-27 LAB — GLUCOSE, CAPILLARY
Glucose-Capillary: 117 mg/dL — ABNORMAL HIGH (ref 70–99)
Glucose-Capillary: 188 mg/dL — ABNORMAL HIGH (ref 70–99)
Glucose-Capillary: 165 mg/dL — ABNORMAL HIGH (ref 70–99)
Glucose-Capillary: 182 mg/dL — ABNORMAL HIGH (ref 70–99)

## 2018-06-27 MED ORDER — MAGNESIUM SULFATE 4 GM/100ML IV SOLN
4.0000 g | Freq: Once | INTRAVENOUS | Status: AC
  Administered 2018-06-27: 4 g via INTRAVENOUS
  Filled 2018-06-27: qty 100

## 2018-06-27 MED ORDER — POTASSIUM CHLORIDE CRYS ER 20 MEQ PO TBCR
40.0000 meq | EXTENDED_RELEASE_TABLET | Freq: Once | ORAL | Status: AC
  Administered 2018-06-27: 40 meq via ORAL
  Filled 2018-06-27: qty 2

## 2018-06-27 NOTE — H&P (View-Only) (Signed)
Progress Note  Patient Name: Tracey Gallegos Date of Encounter: 06/27/2018  Primary Cardiologist: Pixie Casino, MD  Subjective  Doing well, feels that fluid is significantly better, especially in her legs. Sleeps nearly flat, but still feels short of breath and thinks she had some wheezing when she woke up. Doesn't have any history of asthma/COPD or use inhalers at home. She is ok with plan for cath tomorrow.  Inpatient Medications    Scheduled Meds: . aspirin EC  81 mg Oral Daily  . atorvastatin  40 mg Oral q1800  . enoxaparin (LOVENOX) injection  40 mg Subcutaneous Q24H  . furosemide  40 mg Intravenous TID  . hydrALAZINE  25 mg Oral Q8H  . insulin aspart  0-9 Units Subcutaneous TID WC  . losartan  50 mg Oral Daily  . metoprolol succinate  50 mg Oral Daily  . potassium chloride  40 mEq Oral Daily  . potassium chloride  40 mEq Oral Once  . sodium chloride flush  3 mL Intravenous Q12H   Continuous Infusions: . sodium chloride    . magnesium sulfate 1 - 4 g bolus IVPB     PRN Meds: sodium chloride, acetaminophen, albuterol, alum & mag hydroxide-simeth, ondansetron (ZOFRAN) IV, sodium chloride flush   Vital Signs    Vitals:   06/26/18 1129 06/26/18 2038 06/27/18 0636 06/27/18 0638  BP: 127/78 128/80 107/69   Pulse: 86 81 79   Resp: 18 20 20    Temp:  97.9 F (36.6 C) 97.7 F (36.5 C)   TempSrc:  Oral Oral   SpO2: 98% 94% (!) 88%   Weight:    120.4 kg  Height:        Intake/Output Summary (Last 24 hours) at 06/27/2018 0848 Last data filed at 06/27/2018 0630 Gross per 24 hour  Intake 1200 ml  Output 2200 ml  Net -1000 ml   Filed Weights   06/25/18 0346 06/26/18 0532 06/27/18 3810  Weight: 123.4 kg 121.7 kg 120.4 kg    Telemetry    Sinus rhythm with rare PVCs, one very brief run of SVT vs. afib overnight (only a few beats) - Personally Reviewed  ECG    None since 12/5 - Personally Reviewed  Physical Exam   GEN: No acute distress.   Neck: No JVD  appreciated sitting up at 90 degrees Cardiac: RRR, no murmurs, rubs, or gallops.  Respiratory: Improved from yesterday. Faint rales only at bases, clearing in upper fields. No wheezing or rhonchi. GI: Soft, nontender, non-distended  MS: Trace BL LE edema, improved from yesterday; No deformity. Neuro:  Nonfocal  Psych: Normal affect   Labs    Chemistry Recent Labs  Lab 06/25/18 0445 06/26/18 0521 06/27/18 0505  NA 141 141 142  K 4.0 3.2* 3.5  CL 103 102 103  CO2 25 27 28   GLUCOSE 141* 130* 133*  BUN 20 19 19   CREATININE 1.18* 1.11* 1.11*  CALCIUM 9.2 8.9 9.1  GFRNONAA 49* 53* 53*  GFRAA 57* >60 >60  ANIONGAP 13 12 11      Hematology Recent Labs  Lab 06/24/18 1305  WBC 5.9  RBC 4.64  HGB 12.1  HCT 39.2  MCV 84.5  MCH 26.1  MCHC 30.9  RDW 17.0*  PLT 227    Cardiac Enzymes Recent Labs  Lab 06/24/18 1305 06/24/18 1802 06/24/18 2311 06/25/18 0445  TROPONINI 0.03* 0.03* 0.03* 0.03*   No results for input(s): TROPIPOC in the last 168 hours.   BNP Recent Labs  Lab 06/24/18 1305  BNP 1,177.4*     DDimer No results for input(s): DDIMER in the last 168 hours.   Radiology    No results found.  Cardiac Studies   Echo 04/28/18 Study Conclusions  - Left ventricle: The cavity size was mildly dilated. Systolic   function was severely reduced. The estimated ejection fraction   was in the range of 25% to 30%. Moderate diffuse hypokinesis.   There was a reduced contribution of atrial contraction to   ventricular filling, due to increased ventricular diastolic   pressure or atrial contractile dysfunction. Doppler parameters   are consistent with a reversible restrictive pattern, indicative   of decreased left ventricular diastolic compliance and/or   increased left atrial pressure (grade 3 diastolic dysfunction).   Doppler parameters are consistent with high ventricular filling   pressure. - Aortic valve: Trileaflet; mildly thickened, mildly calcified    leaflets. - Mitral valve: Calcified annulus. There was mild regurgitation. - Left atrium: The atrium was severely dilated. - Pulmonary arteries: PA peak pressure: 66 mm Hg (S).  Impressions:  - The right ventricular systolic pressure was increased consistent   with moderate pulmonary hypertension.  Patient Profile     62 y.o. female admitted for acute on chronic systolic congestive heart failure, NYHA class 3 symptoms.  Assessment & Plan    1.  Acute on chronic systolic CHF exacerbation: Recent EF 25-30%, presenting with worsening shortness of breath, weight gain, NYHA class 3 symptoms -Trop, 0.03>> flat. Not consistent with ACS, likely due to acute heart failure. -IV Lasix 40 mg, currently 3 times daily  -Since 12/5, net negative 4.9 L, weight from 124.3 kg to 120.4 kg -strict I&O and daily weights, fluid restriction, low sodium diet -Creatinine, stable at 1.11 today with baseline of 1.0 -Continue ASA 81, atorvastatin 40 -supplement K, aim for >4 given NSVT on monitors 12/7 -plan for [facility] on 06/28/18, orders in, NPO at midnight 12/8. Risks and benefits of cardiac catheterization have been discussed with the patient.  These include bleeding, infection, kidney damage, stroke, heart attack, death.  The patient understands these risks and is willing to proceed. -notes shortness of breath despite continued diuresis, depending on RHC may need additional evaluation for SOB  2.  Essential hypertension: -significantly improved today, lowest it has been this admission -Continue Toprol-XL 50 mg, losartan 50 mg daily -hydralazine ordered while inpatient. If renal function remains stable with diuresis post cath, would prefer to change to entresto prior to discharge and uptitrate  3.  DM2: -Stable, hemoglobin A1c on 04/27/2018, 7.8 -Management per primary team  TIME SPENT WITH PATIENT: 15 minutes of direct patient care. More than 50% of that time was spent on coordination of care and  counseling regarding heart failure management, discussion of cath.  For questions or updates, please contact [place] Please consult [email or web address] for contact info under     Signed, Buford Dresser, MD  06/27/2018, 8:48 AM

## 2018-06-27 NOTE — Progress Notes (Signed)
Progress Note  Patient Name: Tracey Gallegos Date of Encounter: 06/27/2018  Primary Cardiologist: Pixie Casino, MD  Subjective  Doing well, feels that fluid is significantly better, especially in her legs. Sleeps nearly flat, but still feels short of breath and thinks she had some wheezing when she woke up. Doesn't have any history of asthma/COPD or use inhalers at home. She is ok with plan for cath tomorrow.  Inpatient Medications    Scheduled Meds: . aspirin EC  81 mg Oral Daily  . atorvastatin  40 mg Oral q1800  . enoxaparin (LOVENOX) injection  40 mg Subcutaneous Q24H  . furosemide  40 mg Intravenous TID  . hydrALAZINE  25 mg Oral Q8H  . insulin aspart  0-9 Units Subcutaneous TID WC  . losartan  50 mg Oral Daily  . metoprolol succinate  50 mg Oral Daily  . potassium chloride  40 mEq Oral Daily  . potassium chloride  40 mEq Oral Once  . sodium chloride flush  3 mL Intravenous Q12H   Continuous Infusions: . sodium chloride    . magnesium sulfate 1 - 4 g bolus IVPB     PRN Meds: sodium chloride, acetaminophen, albuterol, alum & mag hydroxide-simeth, ondansetron (ZOFRAN) IV, sodium chloride flush   Vital Signs    Vitals:   06/26/18 1129 06/26/18 2038 06/27/18 0636 06/27/18 0638  BP: 127/78 128/80 107/69   Pulse: 86 81 79   Resp: 18 20 20    Temp:  97.9 F (36.6 C) 97.7 F (36.5 C)   TempSrc:  Oral Oral   SpO2: 98% 94% (!) 88%   Weight:    120.4 kg  Height:        Intake/Output Summary (Last 24 hours) at 06/27/2018 0848 Last data filed at 06/27/2018 0630 Gross per 24 hour  Intake 1200 ml  Output 2200 ml  Net -1000 ml   Filed Weights   06/25/18 0346 06/26/18 0532 06/27/18 7564  Weight: 123.4 kg 121.7 kg 120.4 kg    Telemetry    Sinus rhythm with rare PVCs, one very brief run of SVT vs. afib overnight (only a few beats) - Personally Reviewed  ECG    None since 12/5 - Personally Reviewed  Physical Exam   GEN: No acute distress.   Neck: No JVD  appreciated sitting up at 90 degrees Cardiac: RRR, no murmurs, rubs, or gallops.  Respiratory: Improved from yesterday. Faint rales only at bases, clearing in upper fields. No wheezing or rhonchi. GI: Soft, nontender, non-distended  MS: Trace BL LE edema, improved from yesterday; No deformity. Neuro:  Nonfocal  Psych: Normal affect   Labs    Chemistry Recent Labs  Lab 06/25/18 0445 06/26/18 0521 06/27/18 0505  NA 141 141 142  K 4.0 3.2* 3.5  CL 103 102 103  CO2 25 27 28   GLUCOSE 141* 130* 133*  BUN 20 19 19   CREATININE 1.18* 1.11* 1.11*  CALCIUM 9.2 8.9 9.1  GFRNONAA 49* 53* 53*  GFRAA 57* >60 >60  ANIONGAP 13 12 11      Hematology Recent Labs  Lab 06/24/18 1305  WBC 5.9  RBC 4.64  HGB 12.1  HCT 39.2  MCV 84.5  MCH 26.1  MCHC 30.9  RDW 17.0*  PLT 227    Cardiac Enzymes Recent Labs  Lab 06/24/18 1305 06/24/18 1802 06/24/18 2311 06/25/18 0445  TROPONINI 0.03* 0.03* 0.03* 0.03*   No results for input(s): TROPIPOC in the last 168 hours.   BNP Recent Labs  Lab 06/24/18 1305  BNP 1,177.4*     DDimer No results for input(s): DDIMER in the last 168 hours.   Radiology    No results found.  Cardiac Studies   Echo 04/28/18 Study Conclusions  - Left ventricle: The cavity size was mildly dilated. Systolic   function was severely reduced. The estimated ejection fraction   was in the range of 25% to 30%. Moderate diffuse hypokinesis.   There was a reduced contribution of atrial contraction to   ventricular filling, due to increased ventricular diastolic   pressure or atrial contractile dysfunction. Doppler parameters   are consistent with a reversible restrictive pattern, indicative   of decreased left ventricular diastolic compliance and/or   increased left atrial pressure (grade 3 diastolic dysfunction).   Doppler parameters are consistent with high ventricular filling   pressure. - Aortic valve: Trileaflet; mildly thickened, mildly calcified    leaflets. - Mitral valve: Calcified annulus. There was mild regurgitation. - Left atrium: The atrium was severely dilated. - Pulmonary arteries: PA peak pressure: 66 mm Hg (S).  Impressions:  - The right ventricular systolic pressure was increased consistent   with moderate pulmonary hypertension.  Patient Profile     62 y.o. female admitted for acute on chronic systolic congestive heart failure, NYHA class 3 symptoms.  Assessment & Plan    1.  Acute on chronic systolic CHF exacerbation: Recent EF 25-30%, presenting with worsening shortness of breath, weight gain, NYHA class 3 symptoms -Trop, 0.03>> flat. Not consistent with ACS, likely due to acute heart failure. -IV Lasix 40 mg, currently 3 times daily  -Since 12/5, net negative 4.9 L, weight from 124.3 kg to 120.4 kg -strict I&O and daily weights, fluid restriction, low sodium diet -Creatinine, stable at 1.11 today with baseline of 1.0 -Continue ASA 81, atorvastatin 40 -supplement K, aim for >4 given NSVT on monitors 12/7 -plan for [facility] on 06/28/18, orders in, NPO at midnight 12/8. Risks and benefits of cardiac catheterization have been discussed with the patient.  These include bleeding, infection, kidney damage, stroke, heart attack, death.  The patient understands these risks and is willing to proceed. -notes shortness of breath despite continued diuresis, depending on RHC may need additional evaluation for SOB  2.  Essential hypertension: -significantly improved today, lowest it has been this admission -Continue Toprol-XL 50 mg, losartan 50 mg daily -hydralazine ordered while inpatient. If renal function remains stable with diuresis post cath, would prefer to change to entresto prior to discharge and uptitrate  3.  DM2: -Stable, hemoglobin A1c on 04/27/2018, 7.8 -Management per primary team  TIME SPENT WITH PATIENT: 15 minutes of direct patient care. More than 50% of that time was spent on coordination of care and  counseling regarding heart failure management, discussion of cath.  For questions or updates, please contact [place] Please consult [email or web address] for contact info under     Signed, Buford Dresser, MD  06/27/2018, 8:48 AM

## 2018-06-27 NOTE — Progress Notes (Signed)
PROGRESS NOTE    Tracey Gallegos  [name] DOB: [birth year] DOA: 06/24/2018 PCP: Ladell Pier, MD  Brief Narrative: 62 y.o.femalewith medical history significant ofchronic systolic heart failure (EF 25-30% by TTE on 04/28/2018), DM-2, hypertension presenting with the above-noted complaints. Per patient for the past 1 month she is noticed shortness of breath-mostly on exertion. However over the past few days-this is gradually worsened-she is now short of breath even walking a few feet. She has now started to develop shortness of breath at rest-and has also developed orthopnea for the past few nights. She has noted at least a 6 pound weight gain since he last saw her physician. She also has noted worsening lower extremity edema. Due to worsening of the symptoms-she presented to the ED-she was thought to have decompensated systolic heart failure and referred to the [facility] for further evaluation and treatment.  Denies any fever, headache, chest pain, abdominal pain, nausea, vomiting or diarrhea. She denies any dysuria or hematuria Assessment & Plan:   Principal Problem:   Acute systolic (congestive) heart failure (HCC) Active Problems:   Type 2 diabetes mellitus without complication, without long-term current use of insulin (HCC)   HTN (hypertension)   Morbid obesity ([place])    #1 acute on chronic systolic heart failure ejection fraction 25 to 30% by echo 04/28/2018-patient known to me from previous admission at [facility] long for the same. She reports she is very compliant to medications as well as diet. However she reports she is not able to do much of activity at home. She does not move around at home much due to increasing shortness of breath. She lives alone. Cardiology following noted that Lasix dose is increased to 40 mg 3 times a day and may be possible cath on Monday. Patient very concerned about not getting better. Monitor renal functions.  Continue  diuresis per cardiology.  Cardiology thinking about Entresto prior to DC if renal function stable after cath.?not sure if patient able to financially able to buy Entresto??had few beats of vtach last nite.asymptomatic.  #2 hypertension continue metoprolol and ACE inhibitor.  #3 type 2 diabetes continue SSI for now. Oral hypoglycemics are on hold.  #4 hyperlipidemia LDL 148 continue statin.  #5 hypokalemia replete      Estimated body mass index is 40.35 kg/m as calculated from the following:   Height as of this encounter: 5\' 8"  (1.727 m).   Weight as of this encounter: 120.4 kg.  DVT prophylaxis:lovenox Code Status: full Family Communication: none Disposition Plan:cath tomorrow   Consultants: cardiology  Procedures: none Antimicrobials none  Subjective Resting in bed in nad  Objective: Vitals:   06/27/18 0636 06/27/18 0638 06/27/18 0914 06/27/18 1208  BP: 107/69  (!) 146/83 113/83  Pulse: 79  77 68  Resp: 20   20  Temp: 97.7 F (36.5 C)   (!) 97.4 F (36.3 C)  TempSrc: Oral   Oral  SpO2: (!) 88%  98% 92%  Weight:  120.4 kg    Height:        Intake/Output Summary (Last 24 hours) at 06/27/2018 1352 Last data filed at 06/27/2018 0940 Gross per 24 hour  Intake 1083 ml  Output 2900 ml  Net -1817 ml   Filed Weights   06/25/18 0346 06/26/18 0532 06/27/18 5638  Weight: 123.4 kg 121.7 kg 120.4 kg    Examination:  General exam: Appears calm and comfortable  Respiratory system: Crackles to auscultation. Respiratory effort normal. Cardiovascular system: S1 & S2 heard, RRR.  No JVD, murmurs, rubs, gallops or clicks. No pedal edema. Gastrointestinal system: Abdomen is nondistended, soft and nontender. No organomegaly or masses felt. Normal bowel sounds heard. Central nervous system: Alert and oriented. No focal neurological deficits. Extremities: Symmetric 5 x 5 power. Skin: No rashes, lesions or ulcers Psychiatry: Judgement and insight appear normal. Mood &  affect appropriate.     Data Reviewed: I have personally reviewed following labs and imaging studies  CBC: Recent Labs  Lab 06/24/18 1305  WBC 5.9  NEUTROABS 2.8  HGB 12.1  HCT 39.2  MCV 84.5  PLT 845   Basic Metabolic Panel: Recent Labs  Lab 06/24/18 1305 06/25/18 0445 06/26/18 0521 06/27/18 0505  NA 142 141 141 142  K 3.6 4.0 3.2* 3.5  CL 104 103 102 103  CO2 26 25 27 28   GLUCOSE 138* 141* 130* 133*  BUN 18 20 19 19   CREATININE 1.10* 1.18* 1.11* 1.11*  CALCIUM 9.0 9.2 8.9 9.1   GFR: Estimated Creatinine Clearance: 70.8 mL/min (A) (by C-G formula based on SCr of 1.11 mg/dL (H)). Liver Function Tests: No results for input(s): AST, ALT, ALKPHOS, BILITOT, PROT, ALBUMIN in the last 168 hours. No results for input(s): LIPASE, AMYLASE in the last 168 hours. No results for input(s): AMMONIA in the last 168 hours. Coagulation Profile: No results for input(s): INR, PROTIME in the last 168 hours. Cardiac Enzymes: Recent Labs  Lab 06/24/18 1305 06/24/18 1802 06/24/18 2311 06/25/18 0445  TROPONINI 0.03* 0.03* 0.03* 0.03*   BNP (last 3 results) No results for input(s): PROBNP in the last 8760 hours. HbA1C: No results for input(s): HGBA1C in the last 72 hours. CBG: Recent Labs  Lab 06/26/18 1128 06/26/18 1640 06/26/18 2117 06/27/18 0741 06/27/18 1213  GLUCAP 167* 136* 154* 117* 188*   Lipid Profile: No results for input(s): CHOL, HDL, LDLCALC, TRIG, CHOLHDL, LDLDIRECT in the last 72 hours. Thyroid Function Tests: No results for input(s): TSH, T4TOTAL, FREET4, T3FREE, THYROIDAB in the last 72 hours. Anemia Panel: No results for input(s): VITAMINB12, FOLATE, FERRITIN, TIBC, IRON, RETICCTPCT in the last 72 hours. Sepsis Labs: No results for input(s): PROCALCITON, LATICACIDVEN in the last 168 hours.  No results found for this or any previous visit (from the past 240 hour(s)).       Radiology Studies: No results found.      Scheduled Meds: .  aspirin EC  81 mg Oral Daily  . atorvastatin  40 mg Oral q1800  . enoxaparin (LOVENOX) injection  40 mg Subcutaneous Q24H  . furosemide  40 mg Intravenous TID  . hydrALAZINE  25 mg Oral Q8H  . insulin aspart  0-9 Units Subcutaneous TID WC  . losartan  50 mg Oral Daily  . metoprolol succinate  50 mg Oral Daily  . potassium chloride  40 mEq Oral Daily  . sodium chloride flush  3 mL Intravenous Q12H   Continuous Infusions: . sodium chloride 250 mL (06/27/18 1113)     LOS: 3 days     Georgette Shell, MD [facility]  If 7PM-7AM, please contact night-coverage [email or web address] Password [email or web address] 06/27/2018, 1:52 PM

## 2018-06-28 ENCOUNTER — Encounter (HOSPITAL_COMMUNITY): Payer: Self-pay | Admitting: General Practice

## 2018-06-28 ENCOUNTER — Encounter (HOSPITAL_COMMUNITY)
Admission: EM | Disposition: A | Discharge status: Home / Self Care | Payer: Self-pay | Source: Home / Self Care | Attending: Internal Medicine

## 2018-06-28 DIAGNOSIS — I5043 Acute on chronic combined systolic (congestive) and diastolic (congestive) heart failure: Secondary | ICD-10-CM

## 2018-06-28 DIAGNOSIS — I25118 Atherosclerotic heart disease of native coronary artery with other forms of angina pectoris: Secondary | ICD-10-CM

## 2018-06-28 HISTORY — PX: CORONARY STENT INTERVENTION: CATH118234

## 2018-06-28 HISTORY — PX: CARDIAC CATHETERIZATION: SHX172

## 2018-06-28 HISTORY — PX: RIGHT/LEFT HEART CATH AND CORONARY ANGIOGRAPHY: CATH118266

## 2018-06-28 HISTORY — PX: CORONARY STENT PLACEMENT: SHX1402

## 2018-06-28 LAB — POCT I-STAT 3, VENOUS BLOOD GAS (G3P V)
ACID-BASE EXCESS: 3 mmol/L — AB (ref 0.0–2.0)
Acid-Base Excess: 3 mmol/L — ABNORMAL HIGH (ref 0.0–2.0)
Bicarbonate: 29.6 mmol/L — ABNORMAL HIGH (ref 20.0–28.0)
Bicarbonate: 30.1 mmol/L — ABNORMAL HIGH (ref 20.0–28.0)
O2 Saturation: 68 %
O2 Saturation: 69 %
PCO2 VEN: 52 mmHg (ref 44.0–60.0)
TCO2: 31 mmol/L (ref 22–32)
TCO2: 32 mmol/L (ref 22–32)
pCO2, Ven: 53.1 mmHg (ref 44.0–60.0)
pH, Ven: 7.362 (ref 7.250–7.430)
pH, Ven: 7.364 (ref 7.250–7.430)
pO2, Ven: 38 mmHg (ref 32.0–45.0)
pO2, Ven: 38 mmHg (ref 32.0–45.0)

## 2018-06-28 LAB — CBC
HCT: 37.7 % (ref 36.0–46.0)
Hemoglobin: 11.9 g/dL — ABNORMAL LOW (ref 12.0–15.0)
MCH: 26.6 pg (ref 26.0–34.0)
MCHC: 31.6 g/dL (ref 30.0–36.0)
MCV: 84.2 fL (ref 80.0–100.0)
Platelets: 238 10*3/uL (ref 150–400)
RBC: 4.48 MIL/uL (ref 3.87–5.11)
RDW: 16.9 % — AB (ref 11.5–15.5)
WBC: 5.3 10*3/uL (ref 4.0–10.5)
nRBC: 0 % (ref 0.0–0.2)

## 2018-06-28 LAB — BASIC METABOLIC PANEL
Anion gap: 9 (ref 5–15)
Anion gap: 10 (ref 5–15)
BUN: 20 mg/dL (ref 8–23)
BUN: 21 mg/dL (ref 8–23)
CHLORIDE: 104 mmol/L (ref 98–111)
CO2: 28 mmol/L (ref 22–32)
CO2: 26 mmol/L (ref 22–32)
Calcium: 9.1 mg/dL (ref 8.9–10.3)
Calcium: 9 mg/dL (ref 8.9–10.3)
Chloride: 103 mmol/L (ref 98–111)
Creatinine, Ser: 1.19 mg/dL — ABNORMAL HIGH (ref 0.44–1.00)
Creatinine, Ser: 1.4 mg/dL — ABNORMAL HIGH (ref 0.44–1.00)
GFR calc Af Amer: 56 mL/min — ABNORMAL LOW (ref >60)
GFR calc Af Amer: 46 mL/min — ABNORMAL LOW (ref >60)
GFR calc non Af Amer: 49 mL/min — ABNORMAL LOW (ref >60)
GFR calc non Af Amer: 40 mL/min — ABNORMAL LOW (ref >60)
Glucose, Bld: 118 mg/dL — ABNORMAL HIGH (ref 70–99)
Glucose, Bld: 201 mg/dL — ABNORMAL HIGH (ref 70–99)
Potassium: 4 mmol/L (ref 3.5–5.1)
Potassium: 3.8 mmol/L (ref 3.5–5.1)
Sodium: 140 mmol/L (ref 135–145)
Sodium: 140 mmol/L (ref 135–145)

## 2018-06-28 LAB — POCT I-STAT 3, ART BLOOD GAS (G3+)
ACID-BASE EXCESS: 2 mmol/L (ref 0.0–2.0)
Bicarbonate: 28.6 mmol/L — ABNORMAL HIGH (ref 20.0–28.0)
O2 Saturation: 94 %
TCO2: 30 mmol/L (ref 22–32)
pCO2 arterial: 49.8 mmHg — ABNORMAL HIGH (ref 32.0–48.0)
pH, Arterial: 7.367 (ref 7.350–7.450)
pO2, Arterial: 76 mmHg — ABNORMAL LOW (ref 83.0–108.0)

## 2018-06-28 LAB — GLUCOSE, CAPILLARY
GLUCOSE-CAPILLARY: 146 mg/dL — AB (ref 70–99)
Glucose-Capillary: 150 mg/dL — ABNORMAL HIGH (ref 70–99)
Glucose-Capillary: 157 mg/dL — ABNORMAL HIGH (ref 70–99)

## 2018-06-28 LAB — POCT ACTIVATED CLOTTING TIME
Activated Clotting Time: 213 seconds
Activated Clotting Time: 296 seconds
Activated Clotting Time: 318 seconds

## 2018-06-28 LAB — MAGNESIUM: Magnesium: 2 mg/dL (ref 1.7–2.4)

## 2018-06-28 SURGERY — RIGHT/LEFT HEART CATH AND CORONARY ANGIOGRAPHY
Anesthesia: LOCAL

## 2018-06-28 MED ORDER — SODIUM CHLORIDE 0.9% FLUSH: 3.0000 mL | INTRAVENOUS | Status: DC | PRN

## 2018-06-28 MED ORDER — HYDRALAZINE HCL 20 MG/ML IJ SOLN
INTRAMUSCULAR | Status: AC
  Administered 2018-06-28: 20 mg
  Filled 2018-06-28: qty 1

## 2018-06-28 MED ORDER — NITROGLYCERIN 1 MG/10 ML FOR IR/CATH LAB
INTRA_ARTERIAL | Status: AC
  Filled 2018-06-28: qty 10

## 2018-06-28 MED ORDER — SODIUM CHLORIDE 0.9% FLUSH: 3.0000 mL | Freq: Two times a day (BID) | INTRAVENOUS | Status: DC

## 2018-06-28 MED ORDER — SODIUM CHLORIDE 0.9 % IV SOLN: 250.0000 mL | INTRAVENOUS | Status: DC | PRN

## 2018-06-28 MED ORDER — MIDAZOLAM HCL 2 MG/2ML IJ SOLN
INTRAMUSCULAR | Status: AC
  Filled 2018-06-28: qty 2

## 2018-06-28 MED ORDER — FENTANYL CITRATE (PF) 100 MCG/2ML IJ SOLN
INTRAMUSCULAR | Status: DC | PRN
  Administered 2018-06-28 (×2): 25 ug via INTRAVENOUS

## 2018-06-28 MED ORDER — HEPARIN (PORCINE) IN NACL 1000-0.9 UT/500ML-% IV SOLN
INTRAVENOUS | Status: DC | PRN
  Administered 2018-06-28 (×3): 500 mL

## 2018-06-28 MED ORDER — FUROSEMIDE 10 MG/ML IJ SOLN
INTRAMUSCULAR | Status: DC | PRN
  Administered 2018-06-28: 40 mg via INTRAVENOUS

## 2018-06-28 MED ORDER — POTASSIUM CHLORIDE CRYS ER 20 MEQ PO TBCR
40.0000 meq | EXTENDED_RELEASE_TABLET | Freq: Once | ORAL | Status: AC
  Administered 2018-06-28: 40 meq via ORAL
  Filled 2018-06-28: qty 2

## 2018-06-28 MED ORDER — CYCLOBENZAPRINE HCL 10 MG PO TABS
5.0000 mg | ORAL_TABLET | Freq: Four times a day (QID) | ORAL | Status: DC | PRN
  Administered 2018-06-28: 5 mg via ORAL
  Filled 2018-06-28: qty 1

## 2018-06-28 MED ORDER — ASPIRIN 81 MG PO CHEW
81.0000 mg | CHEWABLE_TABLET | ORAL | Status: AC
  Administered 2018-06-28: 81 mg via ORAL
  Filled 2018-06-28: qty 1

## 2018-06-28 MED ORDER — SODIUM CHLORIDE 0.9% FLUSH
3.0000 mL | Freq: Two times a day (BID) | INTRAVENOUS | Status: DC
  Administered 2018-06-28 – 2018-06-29 (×4): 3 mL via INTRAVENOUS

## 2018-06-28 MED ORDER — LABETALOL HCL 5 MG/ML IV SOLN: 10.0000 mg | INTRAVENOUS | Status: AC | PRN

## 2018-06-28 MED ORDER — IOHEXOL 350 MG/ML SOLN
INTRAVENOUS | Status: DC | PRN
  Administered 2018-06-28: 165 mL

## 2018-06-28 MED ORDER — HEPARIN (PORCINE) IN NACL 1000-0.9 UT/500ML-% IV SOLN
INTRAVENOUS | Status: AC
  Filled 2018-06-28: qty 1000

## 2018-06-28 MED ORDER — LIDOCAINE HCL (PF) 1 % IJ SOLN
INTRAMUSCULAR | Status: AC
  Filled 2018-06-28: qty 30

## 2018-06-28 MED ORDER — HEPARIN SODIUM (PORCINE) 1000 UNIT/ML IJ SOLN
INTRAMUSCULAR | Status: AC
  Filled 2018-06-28: qty 1

## 2018-06-28 MED ORDER — LIDOCAINE HCL (PF) 1 % IJ SOLN
INTRAMUSCULAR | Status: DC | PRN
  Administered 2018-06-28 (×2): 2 mL

## 2018-06-28 MED ORDER — HYDRALAZINE HCL 20 MG/ML IJ SOLN: 5.0000 mg | INTRAMUSCULAR | Status: AC | PRN

## 2018-06-28 MED ORDER — HEPARIN SODIUM (PORCINE) 1000 UNIT/ML IJ SOLN
INTRAMUSCULAR | Status: DC | PRN
  Administered 2018-06-28: 4000 [IU] via INTRAVENOUS
  Administered 2018-06-28 (×2): 6000 [IU] via INTRAVENOUS
  Administered 2018-06-28: 3000 [IU] via INTRAVENOUS

## 2018-06-28 MED ORDER — VERAPAMIL HCL 2.5 MG/ML IV SOLN
INTRAVENOUS | Status: DC | PRN
  Administered 2018-06-28: 10 mL via INTRA_ARTERIAL

## 2018-06-28 MED ORDER — BENZONATATE 100 MG PO CAPS: 200.0000 mg | ORAL_CAPSULE | Freq: Three times a day (TID) | ORAL | Status: DC | PRN

## 2018-06-28 MED ORDER — ANGIOPLASTY BOOK
Freq: Once | Status: AC
  Administered 2018-06-28: 21:00:00
  Filled 2018-06-28: qty 1

## 2018-06-28 MED ORDER — FENTANYL CITRATE (PF) 100 MCG/2ML IJ SOLN
INTRAMUSCULAR | Status: AC
  Filled 2018-06-28: qty 2

## 2018-06-28 MED ORDER — HYDRALAZINE HCL 20 MG/ML IJ SOLN: 10.0000 mg | Freq: Once | INTRAMUSCULAR | Status: AC

## 2018-06-28 MED ORDER — NITROGLYCERIN 1 MG/10 ML FOR IR/CATH LAB
INTRA_ARTERIAL | Status: DC | PRN
  Administered 2018-06-28 (×3): 200 ug via INTRACORONARY

## 2018-06-28 MED ORDER — MIDAZOLAM HCL 2 MG/2ML IJ SOLN
INTRAMUSCULAR | Status: DC | PRN
  Administered 2018-06-28 (×2): 1 mg via INTRAVENOUS

## 2018-06-28 MED ORDER — SODIUM CHLORIDE 0.9 % IV SOLN
INTRAVENOUS | Status: DC
  Administered 2018-06-28: 05:00:00 via INTRAVENOUS

## 2018-06-28 MED ORDER — TICAGRELOR 90 MG PO TABS
90.0000 mg | ORAL_TABLET | Freq: Two times a day (BID) | ORAL | Status: DC
  Administered 2018-06-28 – 2018-06-30 (×4): 90 mg via ORAL
  Filled 2018-06-28 (×4): qty 1

## 2018-06-28 MED ORDER — VERAPAMIL HCL 2.5 MG/ML IV SOLN
INTRAVENOUS | Status: AC
  Filled 2018-06-28: qty 2

## 2018-06-28 MED ORDER — ENOXAPARIN SODIUM 40 MG/0.4ML ~~LOC~~ SOLN
40.0000 mg | SUBCUTANEOUS | Status: DC
  Administered 2018-06-29 – 2018-06-30 (×2): 40 mg via SUBCUTANEOUS
  Filled 2018-06-28 (×2): qty 0.4

## 2018-06-28 MED ORDER — TICAGRELOR 90 MG PO TABS
ORAL_TABLET | ORAL | Status: AC
  Filled 2018-06-28: qty 2

## 2018-06-28 MED ORDER — SODIUM CHLORIDE 0.9 % IV SOLN: INTRAVENOUS | Status: AC

## 2018-06-28 MED ORDER — FUROSEMIDE 10 MG/ML IJ SOLN
INTRAMUSCULAR | Status: AC
  Filled 2018-06-28: qty 4

## 2018-06-28 MED ORDER — TICAGRELOR 90 MG PO TABS
ORAL_TABLET | ORAL | Status: DC | PRN
  Administered 2018-06-28: 180 mg via ORAL

## 2018-06-28 MED ORDER — SODIUM CHLORIDE 0.9 % IV SOLN
INTRAVENOUS | Status: AC | PRN
  Administered 2018-06-28: 10 mL/h via INTRAVENOUS

## 2018-06-28 SURGICAL SUPPLY — 29 items
BALLN EMERGE MR 2.5X12 (BALLOONS) ×2
BALLN EMERGE MR PUSH 1.5X15 (BALLOONS) ×2
BALLN SAPPHIRE 2.0X12 (BALLOONS) ×2
BALLN SAPPHIRE 2.5X15 (BALLOONS) ×2
BALLN WOLVERINE 2.00X10 (BALLOONS) ×2
BALLOON EMERGE MR 2.5X12 (BALLOONS) ×1 IMPLANT
BALLOON EMERGE MR PUSH 1.5X15 (BALLOONS) ×1 IMPLANT
BALLOON SAPPHIRE 2.0X12 (BALLOONS) ×1 IMPLANT
BALLOON SAPPHIRE 2.5X15 (BALLOONS) ×1 IMPLANT
BALLOON WOLVERINE 2.00X10 (BALLOONS) ×1 IMPLANT
CATH BALLN WEDGE 5F 110CM (CATHETERS) ×2 IMPLANT
CATH OPTITORQUE TIG 4.0 5F (CATHETERS) ×2 IMPLANT
CATH VISTA GUIDE 6FR XB3.5 (CATHETERS) ×2 IMPLANT
DEVICE RAD COMP TR BAND LRG (VASCULAR PRODUCTS) ×2 IMPLANT
GLIDESHEATH SLEND SS 6F .021 (SHEATH) ×2 IMPLANT
GUIDEWIRE INQWIRE 1.5J.035X260 (WIRE) ×1 IMPLANT
INQWIRE 1.5J .035X260CM (WIRE) ×2
KIT ENCORE 26 ADVANTAGE (KITS) ×2 IMPLANT
KIT HEART LEFT (KITS) ×2 IMPLANT
PACK CARDIAC CATHETERIZATION (CUSTOM PROCEDURE TRAY) ×2 IMPLANT
SHEATH GLIDE SLENDER 4/5FR (SHEATH) ×2 IMPLANT
SHEATH PROBE COVER 6X72 (BAG) ×2 IMPLANT
STENT SIERRA 2.25 X 23 MM (Permanent Stent) ×2 IMPLANT
STENT SIERRA 2.50 X 28 MM (Permanent Stent) ×2 IMPLANT
TRANSDUCER W/STOPCOCK (MISCELLANEOUS) ×2 IMPLANT
TUBING CIL FLEX 10 FLL-RA (TUBING) ×2 IMPLANT
WIRE EMERALD 3MM-J .025X260CM (WIRE) ×2 IMPLANT
WIRE MARVEL STR TIP 190CM (WIRE) ×2 IMPLANT
WIRE MINAMO 190 (WIRE) ×2 IMPLANT

## 2018-06-28 NOTE — Progress Notes (Signed)
Pt doing well post cath/PCI. No chest pain.  She was given additional IV Lasix this am in the cath lab for dyspnea.  Breathing is ok at the time of my exam. BP better controlled.  Will follow BP and urine output.  Continue IV Lasix.   Lauree Chandler 06/28/2018 12:33 PM

## 2018-06-28 NOTE — Progress Notes (Signed)
PROGRESS NOTE    Tracey Gallegos  GEZ:[id number] DOB: 1955-09-28 DOA: 06/24/2018 PCP: Ladell Pier, MD  Brief Narrative:  62 y.o.femalewith medical history significant ofchronic systolic heart failure (EF 25-30% by TTE on 04/28/2018), DM-2, hypertension presenting with the above-noted complaints. Per patient for the past 1 month she is noticed shortness of breath-mostly on exertion. However over the past few days-this is gradually worsened-she is now short of breath even walking a few feet. She has now started to develop shortness of breath at rest-and has also developed orthopnea for the past few nights. She has noted at least a 6 pound weight gain since he last saw her physician. She also has noted worsening lower extremity edema. Due to worsening of the symptoms-she presented to the ED-she was thought to have decompensated systolic heart failure and referred to the [facility] for further evaluation and treatment.  Denies any fever, headache, chest pain, abdominal pain, nausea, vomiting or diarrhea. She denies any dysuria or hematuria  Assessment & Plan:   Principal Problem:   Acute systolic (congestive) heart failure (HCC) Active Problems:   Type 2 diabetes mellitus without complication, without long-term current use of insulin (HCC)   HTN (hypertension)   Morbid obesity (HCC)   #1 acute on chronic systolic heart failure ejection fraction 25 to 30%-status post cath and PCI 06/28/2018.  Cardiology adjusting the dose of Lasix.  They are also thinking about Entresto prior to discharge but patient may not be able to buy it financially.  Follow-up renal functions tomorrow.  #2 hypertension continue metoprolol and ACE inhibitor.  #3 type 2 diabetes continue SSI for now. Oral hypoglycemics are on hold.  #4 hyperlipidemia LDL 148 continue statin.  #5 hypokalemia replete  follow potassium and magnesium.     Estimated body mass index is 40.05 kg/m as calculated  from the following:   Height as of this encounter: 5\' 8"  (1.727 m).   Weight as of this encounter: 119.5 kg.  DVT prophylaxis: Lovenox Code Status: Full code Family Communication: None Disposition Plan: Patient is status post catheter on 12/ 9/19 monitor renal functions if stable and once cardiology care cleared  Consultants: Cardiology   Procedures: None Antimicrobials: None  Subjective: Resting in bed seen after cath complaining of shortness of breath just received a dose of Lasix in the morning and while I was in the room urine output good  Objective: Vitals:   06/28/18 0920 06/28/18 0925 06/28/18 0930 06/28/18 1015  BP: (!) 143/91 (!) 147/100 (!) 151/103   Pulse: 72 77 75   Resp: (!) 27 14 13  (!) 24  Temp:    98.1 F (36.7 C)  TempSrc:    Oral  SpO2: 95% 97% 96%   Weight:      Height:        Intake/Output Summary (Last 24 hours) at 06/28/2018 1409 Last data filed at 06/28/2018 0537 Gross per 24 hour  Intake 780 ml  Output 2400 ml  Net -1620 ml   Filed Weights   06/26/18 0532 06/27/18 0638 06/28/18 0427  Weight: 121.7 kg 120.4 kg 119.5 kg    Examination:  General exam: Appears calm and comfortable  Respiratory system: Crackles bilaterally at the bases to auscultation. Respiratory effort normal. Cardiovascular system: S1 & S2 heard, RRR. No JVD, murmurs, rubs, gallops or clicks. No pedal edema. Gastrointestinal system: Abdomen is nondistended, soft and nontender. No organomegaly or masses felt. Normal bowel sounds heard. Central nervous system: Alert and oriented. No focal neurological deficits.  Extremities: Symmetric 5 x 5 power. Skin: No rashes, lesions or ulcers Psychiatry: Judgement and insight appear normal. Mood & affect appropriate.     Data Reviewed: I have personally reviewed following labs and imaging studies  CBC: Recent Labs  Lab 06/24/18 1305 06/28/18 0548  WBC 5.9 5.3  NEUTROABS 2.8  --   HGB 12.1 11.9*  HCT 39.2 37.7  MCV 84.5 84.2    PLT 227 867   Basic Metabolic Panel: Recent Labs  Lab 06/24/18 1305 06/25/18 0445 06/26/18 0521 06/27/18 0505 06/28/18 0548  NA 142 141 141 142 140  K 3.6 4.0 3.2* 3.5 4.0  CL 104 103 102 103 103  CO2 26 25 27 28 28   GLUCOSE 138* 141* 130* 133* 118*  BUN 18 20 19 19 20   CREATININE 1.10* 1.18* 1.11* 1.11* 1.19*  CALCIUM 9.0 9.2 8.9 9.1 9.1   GFR: Estimated Creatinine Clearance: 65.8 mL/min (A) (by C-G formula based on SCr of 1.19 mg/dL (H)). Liver Function Tests: No results for input(s): AST, ALT, ALKPHOS, BILITOT, PROT, ALBUMIN in the last 168 hours. No results for input(s): LIPASE, AMYLASE in the last 168 hours. No results for input(s): AMMONIA in the last 168 hours. Coagulation Profile: No results for input(s): INR, PROTIME in the last 168 hours. Cardiac Enzymes: Recent Labs  Lab 06/24/18 1305 06/24/18 1802 06/24/18 2311 06/25/18 0445  TROPONINI 0.03* 0.03* 0.03* 0.03*   BNP (last 3 results) No results for input(s): PROBNP in the last 8760 hours. HbA1C: No results for input(s): HGBA1C in the last 72 hours. CBG: Recent Labs  Lab 06/27/18 0741 06/27/18 1213 06/27/18 1649 06/27/18 2202 06/28/18 1100  GLUCAP 117* 188* 165* 182* 150*   Lipid Profile: No results for input(s): CHOL, HDL, LDLCALC, TRIG, CHOLHDL, LDLDIRECT in the last 72 hours. Thyroid Function Tests: No results for input(s): TSH, T4TOTAL, FREET4, T3FREE, THYROIDAB in the last 72 hours. Anemia Panel: No results for input(s): VITAMINB12, FOLATE, FERRITIN, TIBC, IRON, RETICCTPCT in the last 72 hours. Sepsis Labs: No results for input(s): PROCALCITON, LATICACIDVEN in the last 168 hours.  No results found for this or any previous visit (from the past 240 hour(s)).       Radiology Studies: No results found.      Scheduled Meds: . aspirin EC  81 mg Oral Daily  . atorvastatin  40 mg Oral q1800  . [START ON 06/29/2018] enoxaparin (LOVENOX) injection  40 mg Subcutaneous Q24H  . furosemide   40 mg Intravenous TID  . hydrALAZINE  25 mg Oral Q8H  . insulin aspart  0-9 Units Subcutaneous TID WC  . losartan  50 mg Oral Daily  . metoprolol succinate  50 mg Oral Daily  . potassium chloride SA  40 mEq Oral Daily  . sodium chloride flush  3 mL Intravenous Q12H  . sodium chloride flush  3 mL Intravenous Q12H  . ticagrelor  90 mg Oral BID   Continuous Infusions: . sodium chloride 250 mL (06/27/18 1113)  . sodium chloride 100 mL/hr at 06/28/18 1005  . sodium chloride       LOS: 4 days     Georgette Shell, MD [facility]  If 7PM-7AM, please contact night-coverage [email or web address] Password [email or web address] 06/28/2018, 2:09 PM

## 2018-06-28 NOTE — Progress Notes (Signed)
All belongings packed and brought to [place] aware as well

## 2018-06-28 NOTE — Progress Notes (Addendum)
Site area: right brachial ( venous sheath)  Site Prior to Removal:  Level 0  Pressure Applied For 10 MINUTES    Minutes Beginning at 1200  Manual:   Yes.    Patient Status During Pull:  wnl  Post Pull brachial Site:  Level 0  Post Pull Instructions Given:  Yes.    Post Pull Pulses Present:  Yes.    Dressing Applied:  Yes.    Comments:  Tolerated procedure well

## 2018-06-28 NOTE — Interval H&P Note (Signed)
History and Physical Interval Note:  06/28/2018 7:25 AM  Tracey Gallegos  has presented today for surgery, with the diagnosis of ACUTE ON CHRONIC COMBINED SYSTOLIC AND DIASTOLIC HF  The various methods of treatment have been discussed with the patient and family. After consideration of risks, benefits and other options for treatment, the patient has consented to  Procedure(s): RIGHT/LEFT HEART CATH AND CORONARY ANGIOGRAPHY (N/A) with possible PERCUTANEOUS CORONARY INTERVENTION as a surgical intervention .    The patient's history has been reviewed, patient examined, no change in status, stable for surgery.  I have reviewed the patient's chart and labs.  Questions were answered to the patient's satisfaction.     Initial plans are diagnostic catheterization to evaluate for ischemic etiology for reduced EF of 25 to 30% with grade 3 diastolic dysfunction and acute exacerbation of heart failure (combined systolic and diastolic).  If when angiography reveals significant culprit lesion, would plan PCI.  Symptoms are more consistent with heart failure then angina, however need to exclude ischemic cause.  Cath Lab Visit (complete for each Cath Lab visit)  Clinical Evaluation Leading to the Procedure:   ACS: No.  Non-ACS:    Anginal Classification: Not anginal symptoms, class III heart failure symptoms  Anti-ischemic medical therapy: Minimal Therapy (1 class of medications)  Non-Invasive Test Results: Equivocal test results severely reduced EF, thought to be chronic but now with acute exacerbation of heart failure.  Echo reveals grade 3 diastolic function and EF 25 to 30%.  Prior CABG: No previous CABG   Tracey Gallegos

## 2018-06-28 NOTE — Progress Notes (Signed)
PT Cancellation Note  Patient Details Name: Tracey Gallegos MRN: [id number] DOB: November 03, 1955   Cancelled Treatment:    Reason Eval/Treat Not Completed: Patient at procedure or test/unavailable(Pt in CATH lab. Will check back and treat as able. )   Denice Paradise 06/28/2018, 7:56 AM Kaiyana Bedore,PT Acute Rehabilitation Services Pager:  [phone]  Office:  [phone]

## 2018-06-28 NOTE — Progress Notes (Signed)
TR BAND REMOVAL  LOCATION:    right radial  DEFLATED PER PROTOCOL:    Yes.    TIME BAND OFF / DRESSING APPLIED:    1330   SITE UPON ARRIVAL:    Level 1(SMALL HEMATOMA)  SITE AFTER BAND REMOVAL:    Level 1( BRUISED ,SOFT)  CIRCULATION SENSATION AND MOVEMENT:    Within Normal Limits   Yes.    COMMENTS:   Tolerated procedure well

## 2018-06-28 NOTE — Progress Notes (Signed)
PT Cancellation Note  Patient Details Name: Tracey Gallegos MRN: [id number] DOB: [birth year]   Cancelled Treatment:    Reason Eval/Treat Not Completed: Medical issues which prohibited therapy(Nurse asked PT to come back later in pm.  Will attempt. )   Godfrey Pick Prescott Truex 06/28/2018, 12:55 PM [place] Pager:  [phone]  Office:  [phone]

## 2018-06-28 NOTE — Progress Notes (Addendum)
Called by nursing over concern for timing of next Lasix dose. Pt went to cath lab today and had PCI and elevated LVEDP. She has been actively diuresed with 40mg  TID the last few days prior to cath. This morning she got her usual 40mg  as well as an additional 40mg  dose around time of cath (80mg  total). Rosanne Gutting reports marked diuresis since that time. Since 10:30 the patient has already put out 3900cc and is complaining of significant leg cramps. She has a history of these but they are worse this afternoon. She is due to stop IV fluids. Next dose of Lasix was scheduled for 2-3pm, but this was essentially already given around time of cath (80mg  total so far today). I asked nurse to space out next dose of Lasix to around 5pm to cover afternoon/evening dose, then resume prior spaced dosing in AM. Will also check BMET/Mg given leg cramps. Reviewed plan with Dr. Angelena Form who is in agreement.   Ellisha Bankson PA-C  Addendum: nursing already reviewed labs with IM who ordered additional KCl and flexeril. Cr noted to be 1.4. She won't be due for ARB or Lasix anymore tonight but will hold further doses tentatively so team can review AM labs to re-dose based on trend. Neta Upadhyay PA-C

## 2018-06-29 LAB — BASIC METABOLIC PANEL
Anion gap: 11 (ref 5–15)
BUN: 21 mg/dL (ref 8–23)
CO2: 26 mmol/L (ref 22–32)
Calcium: 9.5 mg/dL (ref 8.9–10.3)
Chloride: 103 mmol/L (ref 98–111)
Creatinine, Ser: 1.27 mg/dL — ABNORMAL HIGH (ref 0.44–1.00)
GFR calc Af Amer: 52 mL/min — ABNORMAL LOW (ref >60)
GFR calc non Af Amer: 45 mL/min — ABNORMAL LOW (ref >60)
Glucose, Bld: 132 mg/dL — ABNORMAL HIGH (ref 70–99)
Potassium: 4 mmol/L (ref 3.5–5.1)
SODIUM: 140 mmol/L (ref 135–145)

## 2018-06-29 LAB — GLUCOSE, CAPILLARY
Glucose-Capillary: 124 mg/dL — ABNORMAL HIGH (ref 70–99)
Glucose-Capillary: 170 mg/dL — ABNORMAL HIGH (ref 70–99)
Glucose-Capillary: 136 mg/dL — ABNORMAL HIGH (ref 70–99)
Glucose-Capillary: 151 mg/dL — ABNORMAL HIGH (ref 70–99)

## 2018-06-29 LAB — CBC
HCT: 42.9 % (ref 36.0–46.0)
Hemoglobin: 13.7 g/dL (ref 12.0–15.0)
MCH: 25.6 pg — ABNORMAL LOW (ref 26.0–34.0)
MCHC: 31.9 g/dL (ref 30.0–36.0)
MCV: 80.2 fL (ref 80.0–100.0)
Platelets: 250 10*3/uL (ref 150–400)
RBC: 5.35 MIL/uL — ABNORMAL HIGH (ref 3.87–5.11)
RDW: 17.1 % — ABNORMAL HIGH (ref 11.5–15.5)
WBC: 6.9 10*3/uL (ref 4.0–10.5)
nRBC: 0 % (ref 0.0–0.2)

## 2018-06-29 MED ORDER — LIVING BETTER WITH HEART FAILURE BOOK
Freq: Once | Status: AC
  Administered 2018-06-29: 11:00:00
  Filled 2018-06-29: qty 1

## 2018-06-29 MED ORDER — GLIPIZIDE 5 MG PO TABS
2.5000 mg | ORAL_TABLET | Freq: Every day | ORAL | Status: DC
  Administered 2018-06-30: 2.5 mg via ORAL
  Filled 2018-06-29: qty 1

## 2018-06-29 MED ORDER — FUROSEMIDE 10 MG/ML IJ SOLN
40.0000 mg | Freq: Three times a day (TID) | INTRAMUSCULAR | Status: DC
  Administered 2018-06-29 (×3): 40 mg via INTRAVENOUS
  Filled 2018-06-29 (×3): qty 4

## 2018-06-29 NOTE — Progress Notes (Signed)
PT Cancellation Note  Patient Details Name: Novie Maggio MRN: [id number] DOB: 06/03/1956   Cancelled Treatment:    Reason Eval/Treat Not Completed: Other (comment)(Cardiac rehab just walked pt.  Will return as able.  )   Denice Paradise 06/29/2018, 8:47 AM Averleigh Savary,PT Acute Rehabilitation Services Pager:  [phone]  Office:  [phone]

## 2018-06-29 NOTE — Progress Notes (Signed)
I received call from inpatient Care Manager about patient's new potential medication Tracey Gallegos)  at time of discharge and lack of insurance (payment source).  Patient will be provided a 30 day free card for the medication.  I will also refer patient to outpatient Heart and Vascular care coordination team for ongoing assistance with these needs.

## 2018-06-29 NOTE — Progress Notes (Signed)
PROGRESS NOTE    Tracey Gallegos  YJE:[id number] DOB: 1955/09/17 DOA: 06/24/2018 PCP: Ladell Pier, MD  Brief Narrative:  62 y.o.femalewith medical history significant ofchronic systolic heart failure (EF 25-30% by TTE on 04/28/2018), DM-2, hypertension presenting with the above-noted complaints. Per patient for the past 1 month she is noticed shortness of breath-mostly on exertion. However over the past few days-this is gradually worsened-she is now short of breath even walking a few feet. She has now started to develop shortness of breath at rest-and has also developed orthopnea for the past few nights. She has noted at least a 6 pound weight gain since he last saw her physician. She also has noted worsening lower extremity edema. Due to worsening of the symptoms-she presented to the ED-she was thought to have decompensated systolic heart failure and referred to the triadhospitalist service for further evaluation and treatment.  Denies any fever, headache, chest pain, abdominal pain, nausea, vomiting or diarrhea. She denies any dysuria or hematuria  Assessment & Plan:   Principal Problem:   Acute systolic (congestive) heart failure (HCC) Active Problems:   Type 2 diabetes mellitus without complication, without long-term current use of insulin (HCC)   HTN (hypertension)   Morbid obesity (HCC)   1 acute on chronic combined systolic and diastolic heart failure/severe dilated cardiomyopathy with ejection fraction 25 to 30% status post cath and PCI.   Left ventricular wedge pressure 24 to 26 mmHg.   Cath showsProx Cx to Mid Cx lesion is 90% stenosed with 90% stenosed side branch in Ost 1st Mrg.  A drug-eluting stent was successfully placed in the Ost 1st Mrg using a STENT SIERRA 2.25 X 23 MM (postdilated to 2.4 mm). Post intervention, there is a 0% residual stenosis.  A drug-eluting stent was successfully placed in the p-mCx using a STENT SIERRA 2.50 X 28 MM (postdilated to 2.9  mm). Post intervention, the side branch was reduced to 0% residual stenosis.  Ost Ramus to Ramus lesion is 55% stenosed.  LV end diastolic pressure is severely elevated (acute on chronic diastolic heart failure). With known severely reduced EF by Echo (acute on chronic systolic heart failure) Hemodynamic findings consistent with moderate pulmonary hypertension. -LVEDP of 29 mmHg with wedge pressure of 242-54mmHg -> consistent with acute on chronic combined systolic and diastolic heart failure    Continue dual antiplatelet therapy aspirin and Brilinta for 6 months.  Patient is still fluid overloaded and requires IV diuresis.  She is at high risk for cardiac decompensation with very low ejection fraction and cardiomyopathy.  Cardiology following.  Patient has no financial capability to buy [place].  2] hypertension on multiple meds continue stable  4] type 2 diabetes restart glipizide  5] hyperlipidemia continue statin to keep LDL less than 50  6] hypokalemia/hypomagnesemia repleted.  7] cramps question secondary to statin K and mag repleted.       Estimated body mass index is 37.85 kg/m as calculated from the following:   Height as of this encounter: 5\' 8"  (1.727 m).   Weight as of this encounter: 112.9 kg.  DVT prophylaxis: Lovenox Code Status: Full code Family Communication: No family available Disposition Plan: Pending cardiology clearance for discharge patient still need inpatient stay for IV diuresis patient is still fluid overloaded.  Consultants: Cardiology  Procedures: None Antimicrobials: None  Subjective: Resting in bed complaining of dyspnea on exertion patient complaining of bilateral lower extremity cramps magnesium and potassium repleted   Objective: Vitals:   06/28/18 1927 06/29/18 6378  06/29/18 0743 06/29/18 1159  BP: 132/71 125/72  (!) 119/59  Pulse: 85 78  73  Resp: 20 19  (!) 23  Temp: 97.9 F (36.6 C) 98.2 F (36.8 C)  98.2 F (36.8 C)  TempSrc:  Oral Oral  Oral  SpO2: 96% 97%  97%  Weight:   112.9 kg   Height:        Intake/Output Summary (Last 24 hours) at 06/29/2018 1317 Last data filed at 06/29/2018 0830 Gross per 24 hour  Intake 966.67 ml  Output 2700 ml  Net -1733.33 ml   Filed Weights   06/27/18 0638 06/28/18 0427 06/29/18 0743  Weight: 120.4 kg 119.5 kg 112.9 kg    Examination:  General exam: Appears calm and comfortable  Respiratory system: Decreased breath sounds at the bases to auscultation. Respiratory effort normal. Cardiovascular system: S1 & S2 heard, RRR. No JVD, murmurs, rubs, gallops or clicks. No pedal edema. Gastrointestinal system: Abdomen is nondistended, soft and nontender. No organomegaly or masses felt. Normal bowel sounds heard. Central nervous system: Alert and oriented. No focal neurological deficits. Extremities: 1+ pitting edema Skin: No rashes, lesions or ulcers Psychiatry: Judgement and insight appear normal. Mood & affect appropriate.     Data Reviewed: I have personally reviewed following labs and imaging studies  CBC: Recent Labs  Lab 06/24/18 1305 06/28/18 0548 06/29/18 0126  WBC 5.9 5.3 6.9  NEUTROABS 2.8  --   --   HGB 12.1 11.9* 13.7  HCT 39.2 37.7 42.9  MCV 84.5 84.2 80.2  PLT 227 238 094   Basic Metabolic Panel: Recent Labs  Lab 06/26/18 0521 06/27/18 0505 06/28/18 0548 06/28/18 1459 06/29/18 0126  NA 141 142 140 140 140  K 3.2* 3.5 4.0 3.8 4.0  CL 102 103 103 104 103  CO2 27 28 28 26 26   GLUCOSE 130* 133* 118* 201* 132*  BUN 19 19 20 21 21   CREATININE 1.11* 1.11* 1.19* 1.40* 1.27*  CALCIUM 8.9 9.1 9.1 9.0 9.5  MG  --   --   --  2.0  --    GFR: Estimated Creatinine Clearance: 59.8 mL/min (A) (by C-G formula based on SCr of 1.27 mg/dL (H)). Liver Function Tests: No results for input(s): AST, ALT, ALKPHOS, BILITOT, PROT, ALBUMIN in the last 168 hours. No results for input(s): LIPASE, AMYLASE in the last 168 hours. No results for input(s): AMMONIA in  the last 168 hours. Coagulation Profile: No results for input(s): INR, PROTIME in the last 168 hours. Cardiac Enzymes: Recent Labs  Lab 06/24/18 1305 06/24/18 1802 06/24/18 2311 06/25/18 0445  TROPONINI 0.03* 0.03* 0.03* 0.03*   BNP (last 3 results) No results for input(s): PROBNP in the last 8760 hours. HbA1C: No results for input(s): HGBA1C in the last 72 hours. CBG: Recent Labs  Lab 06/28/18 1100 06/28/18 1625 06/28/18 2220 06/29/18 0644 06/29/18 1158  GLUCAP 150* 157* 146* 124* 170*   Lipid Profile: No results for input(s): CHOL, HDL, LDLCALC, TRIG, CHOLHDL, LDLDIRECT in the last 72 hours. Thyroid Function Tests: No results for input(s): TSH, T4TOTAL, FREET4, T3FREE, THYROIDAB in the last 72 hours. Anemia Panel: No results for input(s): VITAMINB12, FOLATE, FERRITIN, TIBC, IRON, RETICCTPCT in the last 72 hours. Sepsis Labs: No results for input(s): PROCALCITON, LATICACIDVEN in the last 168 hours.  No results found for this or any previous visit (from the past 240 hour(s)).       Radiology Studies: No results found.      Scheduled Meds: . aspirin  EC  81 mg Oral Daily  . atorvastatin  40 mg Oral q1800  . enoxaparin (LOVENOX) injection  40 mg Subcutaneous Q24H  . furosemide  40 mg Intravenous TID  . hydrALAZINE  25 mg Oral Q8H  . insulin aspart  0-9 Units Subcutaneous TID WC  . Living Better with Heart Failure Book   Does not apply Once  . metoprolol succinate  50 mg Oral Daily  . potassium chloride SA  40 mEq Oral Daily  . sodium chloride flush  3 mL Intravenous Q12H  . sodium chloride flush  3 mL Intravenous Q12H  . ticagrelor  90 mg Oral BID   Continuous Infusions: . sodium chloride 250 mL (06/27/18 1113)  . sodium chloride          Georgette Shell, MD  If 7PM-7AM, please contact night-coverage [email or web address] Password [email or web address] 06/29/2018, 1:17 PM

## 2018-06-29 NOTE — Progress Notes (Signed)
Spoke w Transport planner about Tracey Gallegos. Pt presently still does not have any insurance. Will qualify for 30 day free card but unclear about future options. Care manager asked HF coordinator Darnelle Bos to look into further assistance options. Yehya Brendle PA-C

## 2018-06-29 NOTE — Care Management Note (Addendum)
Case Management Note  Patient Details  Name: Tracey Gallegos MRN: [id number] Date of Birth: [birth year]  Subjective/Objective:   S/p stent intervention, she has no insurance, and she goes to [facility] for follow up and apt has been scheduled and put on AVS.  NCM gave patient a Systems developer and gave to [place] they will be filling her medications for her prior to dc. NCM gave patient the patient ast application for brilinta to take to her apt at the [facility] for MD to fill out .  While waiting on patient ast, she can get refill at clinic for 10.00.  She will also be on Entresto, she will need 30 day coupon, and NCM contacted Tracey Gallegos with CHF to see if could get some asst.  She states she will  refer patient to outpatient Heart and Vascular care coordination team for ongoing assistance with these needs.  12/11 Tracey Bamberger RN, BSN - Per MD they have decided not to put her on Entresto, due to low BP, they will keep her on losartan.                 Action/Plan: DC home when ready.   Expected Discharge Date:                  Expected Discharge Plan:  Home/Self Care  In-House Referral:     Discharge planning Services  CM Consult, Follow-up appt scheduled, Medication Assistance, [place] [facility]  Post Acute Care Choice:    Choice offered to:     DME Arranged:    DME Agency:     HH Arranged:    [place] Agency:     Status of Service:  Completed, signed off  If discussed at [employer], dates discussed:    Additional Comments:  Tracey Mayo, RN 06/29/2018, 1:29 PM

## 2018-06-29 NOTE — Progress Notes (Signed)
CARDIAC REHAB PHASE I   PRE:  Rate/Rhythm: 34 SR  BP:  Sitting: 126/76      SaO2: 96 2L  MODE:  Ambulation: 700 ft 122 peak HR  POST:  Rate/Rhythm: 95 SR  BP:  Sitting: 134/57    SaO2: 95 2L  Pt ambulated 775ft in hallway handheld assist with unsteady gait. Declined use of walker.  Pt c/o leg cramps and SOB. Pt educated on importance of ASA, Brilinta, statin, and NTG. Stent card at bedside. Pt given heart healthy and low sodium diets. Offered HF booklet, pt declined. Reviewed restrictions and exercise guidelines. Will refer to CRP II [place] Rufina Falco, RN BSN 06/29/2018 8:53 AM

## 2018-06-29 NOTE — Progress Notes (Addendum)
Progress Note  Patient Name: Tracey Gallegos Date of Encounter: 06/29/2018  Primary Cardiologist: Pixie Casino, MD  Subjective   Patient put out 5700cc of urine yesterday. She had leg cramps yesterday with the high output but feels much better today. SOB and cough improving. Edema improving. Still some DOE when walking with cardiac rehab.  Inpatient Medications    Scheduled Meds: . aspirin EC  81 mg Oral Daily  . atorvastatin  40 mg Oral q1800  . enoxaparin (LOVENOX) injection  40 mg Subcutaneous Q24H  . hydrALAZINE  25 mg Oral Q8H  . insulin aspart  0-9 Units Subcutaneous TID WC  . metoprolol succinate  50 mg Oral Daily  . potassium chloride SA  40 mEq Oral Daily  . sodium chloride flush  3 mL Intravenous Q12H  . sodium chloride flush  3 mL Intravenous Q12H  . ticagrelor  90 mg Oral BID   Continuous Infusions: . sodium chloride 250 mL (06/27/18 1113)  . sodium chloride     PRN Meds: sodium chloride, sodium chloride, acetaminophen, albuterol, alum & mag hydroxide-simeth, benzonatate, cyclobenzaprine, ondansetron (ZOFRAN) IV, sodium chloride flush, sodium chloride flush   Vital Signs    Vitals:   06/28/18 1015 06/28/18 1927 06/29/18 0641 06/29/18 0743  BP:  132/71 125/72   Pulse:  85 78   Resp: (!) 24 20 19    Temp: 98.1 F (36.7 C) 97.9 F (36.6 C) 98.2 F (36.8 C)   TempSrc: Oral Oral Oral   SpO2:  96% 97%   Weight:    112.9 kg  Height:        Intake/Output Summary (Last 24 hours) at 06/29/2018 0800 Last data filed at 06/29/2018 0656 Gross per 24 hour  Intake 726.67 ml  Output 5700 ml  Net -4973.33 ml   Filed Weights   06/27/18 0638 06/28/18 0427 06/29/18 0743  Weight: 120.4 kg 119.5 kg 112.9 kg    Telemetry    NSR occ PVCs 6 beats NSVT - Personally Reviewed  Physical Exam   GEN: No acute distress.  HEENT: Normocephalic, atraumatic, sclera non-icteric. Neck: No JVD or bruits. Cardiac: RRR no murmurs, rubs, or gallops.  Radials/DP/PT 1+ and  equal bilaterally.  Respiratory: Mildly diminished bilaterally, faint crackles L base. Breathing is unlabored. GI: Soft, nontender, non-distended, BS +x 4. MS: no deformity. Extremities: No clubbing or cyanosis. Trace sockline edema. Distal pedal pulses are 2+ and equal bilaterally. Right wrist with mild diffuse ecchymosis but soft, 1+ radial pulse, good cap refill Neuro:  AAOx3. Follows commands. Psych:  Responds to questions appropriately with a normal affect.  Labs    Chemistry Recent Labs  Lab 06/28/18 0548 06/28/18 1459 06/29/18 0126  NA 140 140 140  K 4.0 3.8 4.0  CL 103 104 103  CO2 28 26 26   GLUCOSE 118* 201* 132*  BUN 20 21 21   CREATININE 1.19* 1.40* 1.27*  CALCIUM 9.1 9.0 9.5  GFRNONAA 49* 40* 45*  GFRAA 56* 46* 52*  ANIONGAP 9 10 11      Hematology Recent Labs  Lab 06/24/18 1305 06/28/18 0548 06/29/18 0126  WBC 5.9 5.3 6.9  RBC 4.64 4.48 5.35*  HGB 12.1 11.9* 13.7  HCT 39.2 37.7 42.9  MCV 84.5 84.2 80.2  MCH 26.1 26.6 25.6*  MCHC 30.9 31.6 31.9  RDW 17.0* 16.9* 17.1*  PLT 227 238 250    Cardiac Enzymes Recent Labs  Lab 06/24/18 1305 06/24/18 1802 06/24/18 2311 06/25/18 0445  TROPONINI 0.03* 0.03* 0.03* 0.03*  No results for input(s): TROPIPOC in the last 168 hours.   BNP Recent Labs  Lab 06/24/18 1305  BNP 1,177.4*     DDimer No results for input(s): DDIMER in the last 168 hours.   Radiology    No results found.  Cardiac Studies   Cath 06/28/18 SUMMARY:   Severe DILATED CARDIOMYOPATHY per echocardiogram with evidence of ACUTE ON CHRONIC COMBINED SYSTOLIC AND DIASTOLIC HEART FAILURE: Elevated LVEDP of 29 million mercury with a wedge pressure of 24-26 mmHg.  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  Moderate 55% stenosis  in prox RI.  Moderate Secondary Pulmonary HTN   2D echo 04/28/18 - Left ventricle: The cavity size was mildly dilated. Systolic   function was severely reduced. The estimated ejection fraction   was in the range of 25% to 30%. Moderate diffuse hypokinesis.   There was a reduced contribution of atrial contraction to   ventricular filling, due to increased ventricular diastolic   pressure or atrial contractile dysfunction. Doppler parameters   are consistent with a reversible restrictive pattern, indicative   of decreased left ventricular diastolic compliance and/or   increased left atrial pressure (grade 3 diastolic dysfunction).   Doppler parameters are consistent with high ventricular filling   pressure. - Aortic valve: Trileaflet; mildly thickened, mildly calcified   leaflets. - Mitral valve: Calcified annulus. There was mild regurgitation. - Left atrium: The atrium was severely dilated. - Pulmonary arteries: PA peak pressure: 66 mm Hg (S). Impressions: - The right ventricular systolic pressure was increased consistent   with moderate pulmonary hypertension.  Patient Profile     62 y.o. female with chronic combined CHF (dx 2006 in [place] without prior cath), DM, HTN, probable CKD stage II who presented to the hospital with worsening SOB, weight gain, and CHF. She underwent cath 06/28/18 showing 90% prox-mid Cx with 90% side branch of OM1, s/p DESx2, 55% ost ramus, LVEDP severely elevated and moderate secondary pulm HTN.  Assessment & Plan    1. Acute on chronic combined CHF (EF 25-30%, grade 3 DD) - excellent output yesterday (had also gotten some peri-cath fluids as well). Symptoms are improving but still not back to baseline. Cr bumped to 1.4 post cath yesterday afternoon but now 1.27. Will resume Lasix 40mg  TID and follow. I will discuss resumption of losartan with Dr. Angelena Form given mild rise in Cr post cath. Long term Delene Loll is likely a better option given her LV  dysfunction. I will put in a care management consult to investigate cost. Continue metoprolol. Ultimately will need f/u echo after 3 months of med optimization to eval candidacy for ICD. Reviewed 2g sodium restriction, 2L fluid restriction, daily weights with patient. Will change diet to low sodium with fluid restriction. Rx CHF book.  2. Newly diagnosed CAD s/p PCI - I spoke with Dr. Ellyn Hack yesterday who does not feel this location of CAD would have specifically caused her cardiomyopathy but likely tipped her over into failure. 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 Per cath note, continue DAPT for at least 6 months. Continue BB, statin.  3. Essential HTN - improved this AM after offloading fluid.  4. Probable CKD stage II - follow as above.  5. DM2 - per internal medicine. Would recommend consideration of  SGLT2 given concomitant cardiovascular disease.  6. Hyperlipidemia - LDL was 148 in 04/2018, was not on statin at that time. Now on Lipitor. If the patient is tolerating statin at time of follow-up appointment, would consider rechecking liver function/lipid panel in about 4 weeks. She complained of some leg cramps yesterday so as not to confuse the picture I will not titrate atorva today but would consider increasing to 80mg  at time of discharge if no further issues.  7. NSVT - K and Mg wnl this AM. Continue BB.  For questions or updates, please contact [place] Please consult [email or web address] for contact info under Cardiology/STEMI.  Signed, Charlie Pitter, PA-C 06/29/2018, 8:00 AM    I have personally seen and examined this patient with Melina Copa, PA. I agree with the assessment and plan as outlined above. Still volume overloaded. Will continue IV Lasix today. No chest pain today post stenting of the Circumflex/OM. Continue DAPT. Anticipate discharge tomorrow.   Lauree Chandler 06/29/2018 9:07 AM

## 2018-06-30 ENCOUNTER — Telehealth: Payer: Self-pay | Admitting: *Deleted

## 2018-06-30 LAB — BASIC METABOLIC PANEL
Anion gap: 10 (ref 5–15)
BUN: 26 mg/dL — ABNORMAL HIGH (ref 8–23)
CALCIUM: 9.5 mg/dL (ref 8.9–10.3)
CO2: 27 mmol/L (ref 22–32)
Chloride: 103 mmol/L (ref 98–111)
Creatinine, Ser: 1.18 mg/dL — ABNORMAL HIGH (ref 0.44–1.00)
GFR calc Af Amer: 57 mL/min — ABNORMAL LOW (ref >60)
GFR calc non Af Amer: 49 mL/min — ABNORMAL LOW (ref >60)
Glucose, Bld: 136 mg/dL — ABNORMAL HIGH (ref 70–99)
Potassium: 4 mmol/L (ref 3.5–5.1)
Sodium: 140 mmol/L (ref 135–145)

## 2018-06-30 LAB — CBC
HEMATOCRIT: 44.2 % (ref 36.0–46.0)
Hemoglobin: 13.4 g/dL (ref 12.0–15.0)
MCH: 24.7 pg — ABNORMAL LOW (ref 26.0–34.0)
MCHC: 30.3 g/dL (ref 30.0–36.0)
MCV: 81.5 fL (ref 80.0–100.0)
PLATELETS: 280 10*3/uL (ref 150–400)
RBC: 5.42 MIL/uL — ABNORMAL HIGH (ref 3.87–5.11)
RDW: 17 % — ABNORMAL HIGH (ref 11.5–15.5)
WBC: 5.4 10*3/uL (ref 4.0–10.5)
nRBC: 0 % (ref 0.0–0.2)

## 2018-06-30 LAB — GLUCOSE, CAPILLARY
Glucose-Capillary: 129 mg/dL — ABNORMAL HIGH (ref 70–99)
Glucose-Capillary: 86 mg/dL (ref 70–99)

## 2018-06-30 MED ORDER — FUROSEMIDE 40 MG PO TABS
40.0000 mg | ORAL_TABLET | Freq: Two times a day (BID) | ORAL | Status: DC
  Administered 2018-06-30: 40 mg via ORAL
  Filled 2018-06-30: qty 1

## 2018-06-30 MED ORDER — METOPROLOL SUCCINATE ER 50 MG PO TB24: 50.0000 mg | ORAL_TABLET | Freq: Every day | ORAL | 0 refills | Status: DC

## 2018-06-30 MED ORDER — LOSARTAN POTASSIUM 25 MG PO TABS: 25.0000 mg | ORAL_TABLET | Freq: Every day | ORAL | 0 refills | Status: DC

## 2018-06-30 MED ORDER — LOSARTAN POTASSIUM 50 MG PO TABS
25.0000 mg | ORAL_TABLET | Freq: Every day | ORAL | Status: DC
  Administered 2018-06-30: 10:00:00 25 mg via ORAL
  Filled 2018-06-30: qty 1

## 2018-06-30 MED ORDER — TICAGRELOR 90 MG PO TABS: 90.0000 mg | ORAL_TABLET | Freq: Two times a day (BID) | ORAL | 1 refills | Status: DC

## 2018-06-30 MED FILL — LOSARTAN POTASSIUM 25 MG TA: 25 | 30 days supply | Qty: 30 | Fill #0

## 2018-06-30 MED FILL — METOPROLOL SUCCINATE ER 50: 50 | 30 days supply | Qty: 30 | Fill #0

## 2018-06-30 MED FILL — BRILINTA 90 MG TABLET: 90 | 30 days supply | Qty: 60 | Fill #0

## 2018-06-30 NOTE — Progress Notes (Signed)
Physical Therapy Treatment Patient Details Name: Tracey Gallegos MRN: [id number] DOB: August 05, 1955 Today's Date: 06/30/2018    History of Present Illness 62 y.o.femalewith a hx of chronic combined congestive heart failure(2006),DM 2, hypertension, and hyperlipidemiawho is being followed by cardiology for the evaluation ofacute on chronic combined CHF     PT Comments    Pt progressing well overall, now able to AMB very near baseline, without any limitations from SpO2 or DOE. Pt reports minimally impaired balance AMB, but has confidence that this will improve in time. Pt performs all mobility at modified independent level or better, without assistive device. Pt encouraged to AMB with nursing throughout the day ad lib.   Follow Up Recommendations  No PT follow up     Equipment Recommendations  Other (comment)(tub bench )    Recommendations for Other Services       Precautions / Restrictions Precautions Precautions: None Restrictions Weight Bearing Restrictions: No    Mobility  Bed Mobility Overal bed mobility: Independent                Transfers Overall transfer level: Independent                  Ambulation/Gait Ambulation/Gait assistance: Supervision;Modified independent (Device/Increase time) Gait Distance (Feet): 400 Feet Assistive device: None Gait Pattern/deviations: WFL(Within Functional Limits) Gait velocity: 0.72m/s  (2.50ft/s)  Gait velocity interpretation: 1.31 - 2.62 ft/sec, indicative of limited community ambulator General Gait Details: a few instances of gait instability, but self correcting without external support, no real LOB. Pt reports SOB has returned to baseline levels and that effort of gait and balance remain minimally impaired.    Stairs             Wheelchair Mobility    Modified Rankin (Stroke Patients Only)       Balance Overall balance assessment: No apparent balance deficits (not formally assessed)                                          Cognition Arousal/Alertness: Awake/alert Behavior During Therapy: WFL for tasks assessed/performed Overall Cognitive Status: Within Functional Limits for tasks assessed                                        Exercises      General Comments        Pertinent Vitals/Pain Pain Assessment: No/denies pain    Home Living                      Prior Function            PT Goals (current goals can now be found in the care plan section) Acute Rehab PT Goals Patient Stated Goal: to go home PT Goal Formulation: With patient Time For Goal Achievement: 07/09/18 Potential to Achieve Goals: Good Progress towards PT goals: Progressing toward goals    Frequency    Min 3X/week      PT Plan Current plan remains appropriate    Co-evaluation              AM-PAC PT "6 Clicks" Mobility   Outcome Measure  Help needed turning from your back to your side while in a flat bed without using bedrails?: None Help needed moving from lying on  your back to sitting on the side of a flat bed without using bedrails?: None Help needed moving to and from a bed to a chair (including a wheelchair)?: None Help needed standing up from a chair using your arms (e.g., wheelchair or bedside chair)?: None Help needed to walk in hospital room?: None Help needed climbing 3-5 steps with a railing? : A Little 6 Click Score: 23    End of Session   Activity Tolerance: Patient tolerated treatment well;No increased pain Patient left: in chair;with call bell/phone within reach   PT Visit Diagnosis: Muscle weakness (generalized) (M62.81);Other abnormalities of gait and mobility (R26.89)     Time: 7867-5449 PT Time Calculation (min) (ACUTE ONLY): 11 min  Charges:  $Therapeutic Activity: 8-22 mins                     10:46 AM, 06/30/18 Etta Grandchild, PT, DPT Physical Therapist - [place] [phone] (Pager)  [phone]  (Office)      Tysean Vandervliet C 06/30/2018, 10:44 AM

## 2018-06-30 NOTE — Clinical Social Work Note (Signed)
CSW advised that patient ready for discharge today and needs cab transport home. Per nurse, patient has no family that can come to get her and she drove herself to the hospital but cannot drive home per nurse. CSW confirmed address with patient and voucher given to nurse to arrange transport. CSW signing off as no other SW intervention services needed.  Franke Menter Givens, MSW, LCSW Licensed Clinical Social Worker [place] [phone]

## 2018-06-30 NOTE — Progress Notes (Addendum)
Progress Note  Patient Name: Tracey Gallegos Date of Encounter: 06/30/2018  Primary Cardiologist: Pixie Casino, MD  Subjective   Feeling significantly better. No SOB. No further edema. Blood pressure has now been intermittently soft so hydralazine not reliably being given.   Inpatient Medications    Scheduled Meds: . aspirin EC  81 mg Oral Daily  . atorvastatin  40 mg Oral q1800  . enoxaparin (LOVENOX) injection  40 mg Subcutaneous Q24H  . glipiZIDE  2.5 mg Oral QAC breakfast  . insulin aspart  0-9 Units Subcutaneous TID WC  . metoprolol succinate  50 mg Oral Daily  . potassium chloride SA  40 mEq Oral Daily  . sodium chloride flush  3 mL Intravenous Q12H  . sodium chloride flush  3 mL Intravenous Q12H  . ticagrelor  90 mg Oral BID   Continuous Infusions: . sodium chloride 250 mL (06/27/18 1113)  . sodium chloride     PRN Meds: sodium chloride, sodium chloride, acetaminophen, albuterol, alum & mag hydroxide-simeth, benzonatate, cyclobenzaprine, ondansetron (ZOFRAN) IV, sodium chloride flush, sodium chloride flush   Vital Signs    Vitals:   06/29/18 1622 06/29/18 2037 06/29/18 2039 06/30/18 0705  BP: 115/71 (!) 90/52 101/71 109/81  Pulse: 68 66  70  Resp: 17 (!) 29  (!) 21  Temp: 97.7 F (36.5 C) 97.9 F (36.6 C)  97.8 F (36.6 C)  TempSrc: Oral Oral  Oral  SpO2: 98% 98%  97%  Weight:    113.3 kg  Height:        Intake/Output Summary (Last 24 hours) at 06/30/2018 0809 Last data filed at 06/30/2018 4696 Gross per 24 hour  Intake 960 ml  Output 1550 ml  Net -590 ml   Filed Weights   06/28/18 0427 06/29/18 0743 06/30/18 0705  Weight: 119.5 kg 112.9 kg 113.3 kg    Telemetry    NSR - Personally Reviewed  Physical Exam   GEN: No acute distress.  HEENT: Normocephalic, atraumatic, sclera non-icteric. Neck: No JVD or bruits. Cardiac: RRR no murmurs, rubs, or gallops.  Radials/DP/PT 1+ and equal bilaterally.  Respiratory: Clear to auscultation  bilaterally. Breathing is unlabored. GI: Soft, nontender, non-distended, BS +x 4. MS: no deformity. Extremities: No clubbing or cyanosis. No edema. Distal pedal pulses are 2+ and equal bilaterally. Right radial cath site with moderate ecchymosis up forearm but soft, good pulse, good cap refill Neuro:  AAOx3. Follows commands. Psych:  Responds to questions appropriately with a normal affect.  Labs    Chemistry Recent Labs  Lab 06/28/18 1459 06/29/18 0126 06/30/18 0401  NA 140 140 140  K 3.8 4.0 4.0  CL 104 103 103  CO2 '26 26 27  '$ GLUCOSE 201* 132* 136*  BUN 21 21 26*  CREATININE 1.40* 1.27* 1.18*  CALCIUM 9.0 9.5 9.5  GFRNONAA 40* 45* 49*  GFRAA 46* 52* 57*  ANIONGAP '10 11 10     '$ Hematology Recent Labs  Lab 06/28/18 0548 06/29/18 0126 06/30/18 0401  WBC 5.3 6.9 5.4  RBC 4.48 5.35* 5.42*  HGB 11.9* 13.7 13.4  HCT 37.7 42.9 44.2  MCV 84.2 80.2 81.5  MCH 26.6 25.6* 24.7*  MCHC 31.6 31.9 30.3  RDW 16.9* 17.1* 17.0*  PLT 238 250 280    Cardiac Enzymes Recent Labs  Lab 06/24/18 1305 06/24/18 1802 06/24/18 2311 06/25/18 0445  TROPONINI 0.03* 0.03* 0.03* 0.03*   No results for input(s): TROPIPOC in the last 168 hours.   BNP Recent Labs  Lab 06/24/18 1305  BNP 1,177.4*     DDimer No results for input(s): DDIMER in the last 168 hours.   Radiology    No results found.  Cardiac Studies   Cath 06/28/18 SUMMARY:   Severe DILATED CARDIOMYOPATHY per echocardiogram with evidence ofACUTE ON CHRONIC COMBINED SYSTOLIC AND DIASTOLIC HEART FAILURE:Elevated LVEDP of 29 million mercury with a wedge pressure of 24-26 mmHg.  Severe single-vessel bifurcation disease involving the Cx &1st Mrg ([place] 1, 1, 190% stenosis) -treated with provisional stenting of the 1st Mrg (Xience [place] DES 2.25 mm x 23 mm --> 2.4 mm)as well as Cx (Xience [place] DES 2.5 mm x 28 mm -> 2.9 mm).  Moderate 55% stenosis in prox RI.  Moderate Secondary Pulmonary HTN   2D echo 04/28/18 -  Left ventricle: The cavity size was mildly dilated. Systolic function was severely reduced. The estimated ejection fraction was in the range of 25% to 30%. Moderate diffuse hypokinesis. There was a reduced contribution of atrial contraction to ventricular filling, due to increased ventricular diastolic pressure or atrial contractile dysfunction. Doppler parameters are consistent with a reversible restrictive pattern, indicative of decreased left ventricular diastolic compliance and/or increased left atrial pressure (grade 3 diastolic dysfunction). Doppler parameters are consistent with high ventricular filling pressure. - Aortic valve: Trileaflet; mildly thickened, mildly calcified leaflets. - Mitral valve: Calcified annulus. There was mild regurgitation. - Left atrium: The atrium was severely dilated. - Pulmonary arteries: PA peak pressure: 66 mm Hg (S). Impressions: - The right ventricular systolic pressure was increased consistent with moderate pulmonary hypertension.  Patient Profile     62 y.o. female with chronic combined CHF (dx 2006 in [place] without prior cath), DM, HTN, probable CKD stage II who presented to the hospital with worsening SOB, weight gain, and CHF. She underwent cath 06/28/18 showing 90% prox-mid Cx with 90% side branch of OM1, s/p DESx2, 55% ost ramus, LVEDP severely elevated and moderate secondary pulm HTN.  Assessment & Plan    1. Acute on chronic combined CHF (EF 25-30%, grade 3 DD) - weight now significantly below OP weights. Diuresed 13L, weight down 25lbs. BUN has bumped relative to her creatinine and she is feeling back to baseline so I believe we have met endpoint of diuresis. Her blood pressure is now significantly lower now that she is euvolemic. She has not been reliably getting hydralazine due to being held for soft BP, will discontinue. I do not think her BP would currently support Entresto so will hold off. I will hold  further IV Lasix/potassium and discuss final med recs with MD. Suspect we can add back some degree of low dose losartan and standing Lasix to go home with. Spironolactone can be considered in OP setting contingent on BP. Ultimately will need f/u echo after 3 months of med optimization to eval candidacy for ICD. CHF education provided this admission. I will clarify with MD if OK to be discharged today. Note to attending team: patient wishes to use the [facility] pharmacy at discharge. This allows her new prescriptions to be delivered to bedside prior to discharge from the hospital. Pharmacist has updated her listed pharmacy to this. [facility] will take care of routing the ordered refills to her usual pharmacy. I will clarify if OK for DC with cardiologist today.  2. Newly diagnosed CAD s/p PCI - Dr Ellyn Hack stated this location of CAD would have specifically caused her cardiomyopathy but likely tipped her over into failure. 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 Per cath note,  continue DAPT for at least 6 months. Continue BB, statin.  3. Essential HTN - improved this AM after offloading fluid.  4. Probable CKD stage II - stable today.  5. DM2 - per internal medicine. Would recommend consideration of SGLT2 given concomitant cardiovascular disease.  6. Hyperlipidemia - LDL was 148 in 04/2018, was not on statin at that time. Now on Lipitor. If the patient is tolerating statin at time of follow-up appointment, would consider rechecking liver function/lipid panel in about 4 weeks. She complained of some leg cramps yesterday so as not to confuse the picture I will not titrate atorva today but would consider increasing to '80mg'$  at time of discharge if no further issues.  7. NSVT - K and Mg wnl. Quiescent on telemetry.  TOC f/u arranged with Dr. Debara Pickett 07/07/18 at 2pm. Sent message to office for [facility] call.   For questions or updates, please contact [address] Please consult [email or web address] for contact info under  Cardiology/STEMI.  Signed, Charlie Pitter, PA-C 06/30/2018, 8:09 AM    I have personally seen and examined this patient. I agree with the assessment and plan as outlined above.  She is euvolemic. She is feeling much better. Will restart Losartan at lower dose (25 mg daily) and change Lasix back to home dose (40 mg BID) OK for discharge today.  Follow up in our office next week has been arranged.   Lauree Chandler 06/30/2018 9:27 AM

## 2018-06-30 NOTE — Telephone Encounter (Signed)
-----   Message from Charlie Pitter, PA-C sent at 06/30/2018  8:19 AM EST ----- Regarding: Will need TOC call when discharged I arranged f/u 12/18 with Dr. Debara Pickett already, will just need TOC call for CHF when discharged -likely today or tomorrow.

## 2018-06-30 NOTE — Discharge Summary (Signed)
Physician Discharge Summary  Tracey Gallegos UEA:[id number] DOB: [birth year] DOA: 06/24/2018  PCP: Ladell Pier, MD  Admit date: 06/24/2018 Discharge date: 06/30/2018  Admitted From: home Disposition: home Recommendations for Outpatient Follow-up:  1. Follow up with PCP in 1-2 weeks 2. Please obtain BMP/CBC in one week 3. Please follow up on the following pending results:  Home Health:none Equipment/Devices none Discharge Condition:stable CODE STATUS:full Diet recommendation:cardiac Brief/Interim Summary:62 y.o.femalewith medical history significant ofchronic systolic heart failure (EF 25-30% by TTE on 04/28/2018), DM-2, hypertension presenting with the above-noted complaints. Per patient for the past 1 month she is noticed shortness of breath-mostly on exertion. However over the past few days-this is gradually worsened-she is now short of breath even walking a few feet. She has now started to develop shortness of breath at rest-and has also developed orthopnea for the past few nights. She has noted at least a 6 pound weight gain since he last saw her physician. She also has noted worsening lower extremity edema. Due to worsening of the symptoms-she presented to the ED-she was thought to have decompensated systolic heart failure and referred to the [facility] for further evaluation and treatment.  Denies any fever, headache, chest pain, abdominal pain, nausea, vomiting or diarrhea. She denies any dysuria or hematuria    Discharge Diagnoses:  Principal Problem:   Acute systolic (congestive) heart failure (HCC) Active Problems:   Type 2 diabetes mellitus without complication, without long-term current use of insulin (HCC)   HTN (hypertension)   Morbid obesity (HCC)   1 acute on chronic combined systolic and diastolic heart failure/severe dilated cardiomyopathy with ejection fraction 25 to 30% status post cath and PCI.   Left ventricular wedge pressure 24 to  26 mmHg.   Cath showsProx Cx to Mid Cx lesion is 90% stenosed with 90% stenosed side branch in Ost 1st Mrg.  A drug-eluting stent was successfully placed in the Ost 1st Mrg using a STENT SIERRA 2.25 X 23 MM (postdilated to 2.4 mm). Post intervention, there is a 0% residual stenosis.  A drug-eluting stent was successfully placed in the p-mCx using a STENT SIERRA 2.50 X 28 MM (postdilated to 2.9 mm). Post intervention, the side branch was reduced to 0% residual stenosis.  Ost Ramus to Ramus lesion is 55% stenosed.  LV end diastolic pressure is severely elevated (acute on chronic diastolic heart failure). With known severely reduced EF by Echo (acute on chronic systolic heart failure) Hemodynamic findings consistent with moderate pulmonary hypertension. -LVEDP of 29 mmHg with wedge pressure of 242-76mHg -> consistent with acute on chronic combined systolic and diastolic heart failure    Continue dual antiplatelet therapy aspirin and Brilinta for 6 months.lasix 40 mg bid  2] hypertension on multiple meds continue stable  4] type 2 diabetes restart glipizide  5] hyperlipidemia continue statin to keep LDL less than 50  6] hypokalemia/hypomagnesemia repleted.        Estimated body mass index is 37.98 kg/m as calculated from the following:   Height as of this encounter: '5\' 8"'$  (1.727 m).   Weight as of this encounter: 113.3 kg.  Discharge Instructions  Discharge Instructions    Amb Referral to Cardiac Rehabilitation   Complete by:  As directed    Diagnosis:  Coronary Stents   Call MD for:  persistant nausea and vomiting   Complete by:  As directed    Call MD for:  redness, tenderness, or signs of infection (pain, swelling, redness, odor or green/yellow discharge around incision site)  Complete by:  As directed    Diet - low sodium heart healthy   Complete by:  As directed    Increase activity slowly   Complete by:  As directed      Allergies as of 06/30/2018       Reactions   Lisinopril Cough      Medication List    TAKE these medications   aspirin 81 MG EC tablet Take 1 tablet (81 mg total) by mouth daily.   atorvastatin 40 MG tablet Commonly known as:  LIPITOR Take 1 tablet (40 mg total) by mouth daily at 6 PM.   FISH OIL PO Take 1,000 mg by mouth daily.   furosemide 40 MG tablet Commonly known as:  LASIX Take 1 tablet (40 mg total) by mouth 2 (two) times daily.   glipiZIDE 5 MG tablet Commonly known as:  GLUCOTROL Take 0.5 tablets (2.5 mg total) by mouth daily before breakfast.   glucose blood test strip Use as instructed   losartan 25 MG tablet Commonly known as:  COZAAR Take 1 tablet (25 mg total) by mouth daily. Start taking on:  07/01/2018 What changed:    medication strength  how much to take   metoprolol succinate 50 MG 24 hr tablet Commonly known as:  TOPROL-XL Take 1 tablet (50 mg total) by mouth daily. Take with or immediately following a meal. Start taking on:  07/01/2018 What changed:  additional instructions   potassium chloride 10 MEQ tablet Commonly known as:  K-DUR Take 1 tablet (10 mEq total) by mouth 2 (two) times daily.   ticagrelor 90 MG Tabs tablet Commonly known as:  BRILINTA Take 1 tablet (90 mg total) by mouth 2 (two) times daily.   TRUE METRIX METER Devi 1 kit by Does not apply route 4 (four) times daily.   TRUEPLUS LANCETS 28G Misc 28 g by Does not apply route 4 (four) times daily.      Follow-up [facility] Follow up on 07/09/2018.   Why:  8:30 for hospital follow up Contact information: [address] [place] [address] [phone]       Pixie Casino, MD Follow up.   Specialty:  Cardiology Why:  See below for follow-up appointment on 07/07/18 Contact information: [place] [address] [phone]        Ladell Pier, MD Follow up.   Specialty:  Internal  Medicine Contact information: [address] [place] [address] [phone]          Allergies  Allergen Reactions  . Lisinopril Cough    Consultations: cardiology  Procedures/Studies: Dg Chest 2 View  Result Date: 06/24/2018 CLINICAL DATA:  Shortness of breath for 2 days EXAM: CHEST - 2 VIEW COMPARISON:  02/26/2009 FINDINGS: Cardiac shadow remains enlarged. Central vascular congestion is again identified with mild edema stable from the previous exam. No focal infiltrate or effusion is noted. No acute bony abnormality is seen. IMPRESSION: Mild vascular congestion and edema. Electronically Signed   By: Inez Catalina M.D.   On: 06/24/2018 13:40    (Echo, Carotid, EGD, Colonoscopy, ERCP)    Subjective:feels well breathing better   Discharge Exam: Vitals:   06/30/18 0705 06/30/18 1025  BP: 109/81   Pulse: 70 89  Resp: (!) 21   Temp: 97.8 F (36.6 C)   SpO2: 97% 98%   Vitals:   06/29/18 2037 06/29/18 2039 06/30/18 0705 06/30/18  1025  BP: (!) 90/52 101/71 109/81   Pulse: 66  70 89  Resp: (!) 29  (!) 21   Temp: 97.9 F (36.6 C)  97.8 F (36.6 C)   TempSrc: Oral  Oral   SpO2: 98%  97% 98%  Weight:   113.3 kg   Height:        General: Pt is alert, awake, not in acute distress Cardiovascular: RRR, S1/S2 +, no rubs, no gallops Respiratory: CTA bilaterally, no wheezing, no rhonchi Abdominal: Soft, NT, ND, bowel sounds + Extremities: no edema, no cyanosis    The results of significant diagnostics from this hospitalization (including imaging, microbiology, ancillary and laboratory) are listed below for reference.     Microbiology: No results found for this or any previous visit (from the past 240 hour(s)).   Labs: BNP (last 3 results) Recent Labs    04/27/18 1613 06/24/18 1305  BNP 516.6* 3,846.6*   Basic Metabolic Panel: Recent Labs  Lab 06/27/18 0505 06/28/18 0548 06/28/18 1459 06/29/18 0126 06/30/18 0401  NA 142 140 140 140 140  K 3.5  4.0 3.8 4.0 4.0  CL 103 103 104 103 103  CO2 '28 28 26 26 27  '$ GLUCOSE 133* 118* 201* 132* 136*  BUN '19 20 21 21 '$ 26*  CREATININE 1.11* 1.19* 1.40* 1.27* 1.18*  CALCIUM 9.1 9.1 9.0 9.5 9.5  MG  --   --  2.0  --   --    Liver Function Tests: No results for input(s): AST, ALT, ALKPHOS, BILITOT, PROT, ALBUMIN in the last 168 hours. No results for input(s): LIPASE, AMYLASE in the last 168 hours. No results for input(s): AMMONIA in the last 168 hours. CBC: Recent Labs  Lab 06/24/18 1305 06/28/18 0548 06/29/18 0126 06/30/18 0401  WBC 5.9 5.3 6.9 5.4  NEUTROABS 2.8  --   --   --   HGB 12.1 11.9* 13.7 13.4  HCT 39.2 37.7 42.9 44.2  MCV 84.5 84.2 80.2 81.5  PLT 227 238 250 280   Cardiac Enzymes: Recent Labs  Lab 06/24/18 1305 06/24/18 1802 06/24/18 2311 06/25/18 0445  TROPONINI 0.03* 0.03* 0.03* 0.03*   BNP: Invalid input(s): POCBNP CBG: Recent Labs  Lab 06/29/18 0644 06/29/18 1158 06/29/18 1625 06/29/18 2048 06/30/18 0709  GLUCAP 124* 170* 136* 151* 129*   D-Dimer No results for input(s): DDIMER in the last 72 hours. Hgb A1c No results for input(s): HGBA1C in the last 72 hours. Lipid Profile No results for input(s): CHOL, HDL, LDLCALC, TRIG, CHOLHDL, LDLDIRECT in the last 72 hours. Thyroid function studies No results for input(s): TSH, T4TOTAL, T3FREE, THYROIDAB in the last 72 hours.  Invalid input(s): FREET3 Anemia work up No results for input(s): VITAMINB12, FOLATE, FERRITIN, TIBC, IRON, RETICCTPCT in the last 72 hours. Urinalysis No results found for: COLORURINE, APPEARANCEUR, LABSPEC, [place], GLUCOSEU, HGBUR, BILIRUBINUR, KETONESUR, PROTEINUR, UROBILINOGEN, NITRITE, LEUKOCYTESUR Sepsis Labs Invalid input(s): PROCALCITONIN,  WBC,  LACTICIDVEN Microbiology No results found for this or any previous visit (from the past 240 hour(s)).   Time coordinating discharge:  34  minutes  SIGNED:   Georgette Shell, MD  [facility] 06/30/2018, 11:17  AM Pager   If 7PM-7AM, please contact night-coverage [email or web address] Password [email or web address]

## 2018-07-01 ENCOUNTER — Telehealth: Payer: Self-pay

## 2018-07-01 NOTE — Telephone Encounter (Signed)
Pt returned call to [place].  Please call pt [phone] .

## 2018-07-01 NOTE — Telephone Encounter (Signed)
Left message for pt to call.

## 2018-07-01 NOTE — Telephone Encounter (Signed)
Transition Care Management Follow-up Telephone Call Date of discharge and from where: 06/30/2018. [facility]    Call placed to # [phone] and a message was left requesting a call back to this CM # [phone]

## 2018-07-01 NOTE — Telephone Encounter (Signed)
  Patient contacted regarding discharge from hospital 06/30/18  Patient understands to follow up with Dr.Hilty 07/07/18 at 2:00 pm. Patient understands discharge instructions. Patient understands medications and regiment. Patient understands to bring all medications to this visit.

## 2018-07-02 ENCOUNTER — Telehealth: Payer: Self-pay

## 2018-07-02 NOTE — Telephone Encounter (Signed)
Transition Care Management Follow-up Telephone Call  Date of discharge and from where: 06/30/2018, [facility]   How have you been since you were released from the hospital? She said that she is " doing okay."  Any questions or concerns? No questions/concerns reported at this time   Items Reviewed:  Did the pt receive and understand the discharge instructions provided?she said that she has the discharge instructions and has no questions.   Medications obtained and verified? She said that she has all medications and is taking them as ordered.  She did not want to review the list. Instructed her to call if she has any questions. She said that she was given th brilinta at discharge and she also confirmed that she is aware that changes were made to her losartan and toprol XL.    Any new allergies since your discharge? None reported  Do you have support at home? She lives alone but her mother is coming to stay stay with her today.   Other (ie: DME, Home Health, etc) she has a glucometer. Said that she checks her blood sugars 2-3x/day. This morning her blood sugar was 180 and she said that is not where her blood sugar needs to be and she is working on it. Instructed her to keep a log of her blood sugars.    she reported that her surgical site is healing without signs of infection.   Functional Questionnaire: (I = Independent and D = Dependent) ADL's: independent   Follow up appointments reviewed:    PCP Hospital f/u appt confirmed? Yes, 07/09/18 @ 0830 with Dr [facility] f/u appt confirmed?reminded her of appointment with cardiology 07/07/18   Are transportation arrangements needed? She said that she plans to drive. Informed her that [facility] can provide cab transportation to her appointment on 07/09/18 if needed   If their condition worsens, is the pt aware to call  their PCP or go to the ED? yes       Was the patient provided with contact information for the PCP's  office or ED? She has the number  Was the pt encouraged to call back with questions or concerns?yes

## 2018-07-05 ENCOUNTER — Telehealth (HOSPITAL_COMMUNITY): Payer: Self-pay

## 2018-07-05 NOTE — Telephone Encounter (Signed)
Called and spoke with pt in regards to CR, pt stated she is awaiting a decision from Medicaid. Not sure how long it will take. But she will like to start the CR maintenance program. Will pass pt information to our CR maintenance coordinator.  Closed referral

## 2018-07-06 MED FILL — FUROSEMIDE 40 MG TAB: 40 | 30 days supply | Qty: 60 | Fill #1

## 2018-07-07 ENCOUNTER — Ambulatory Visit (INDEPENDENT_AMBULATORY_CARE_PROVIDER_SITE_OTHER): Payer: Self-pay | Admitting: Internal Medicine

## 2018-07-07 ENCOUNTER — Telehealth: Payer: Self-pay | Admitting: Internal Medicine

## 2018-07-07 ENCOUNTER — Encounter: Payer: Self-pay | Admitting: Internal Medicine

## 2018-07-07 VITALS — BP 123/78 | HR 66 | Ht 68.0 in | Wt 252.4 lb

## 2018-07-07 DIAGNOSIS — I255 Ischemic cardiomyopathy: Secondary | ICD-10-CM

## 2018-07-07 DIAGNOSIS — I5043 Acute on chronic combined systolic (congestive) and diastolic (congestive) heart failure: Secondary | ICD-10-CM

## 2018-07-07 DIAGNOSIS — I10 Essential (primary) hypertension: Secondary | ICD-10-CM

## 2018-07-07 DIAGNOSIS — Z955 Presence of coronary angioplasty implant and graft: Secondary | ICD-10-CM

## 2018-07-07 NOTE — Progress Notes (Signed)
OFFICE NOTE  Chief Complaint:  Follow-up hospitalization  Primary Care Physician: Tracey Pier, MD  HPI:  Tracey Gallegos is a 62 y.o. female with a past medial history significant for congestive heart failure with a diagnosis of acute systolic congestive heart failure in 2006 in [place].  The details are not totally clear however she said she was living in [place] at the time.  She may have had work-up in [facility].  We will try to obtain records from her primary care provider.  She said that she was treated with diuretics and that she did have a stress test which is apparently negative for ischemia.  Subsequently she has been noncompliant with medications and is moved around, ultimately recently moving to [place].  She was admitted for acute on chronic systolic congestive heart failure.  An echo showed EF of 25 to 30% with severe global hypokinesis and grade 3 diastolic dysfunction.  There was mild mitral regurgitation, severe left atrial enlargement and moderate pulmonary hypertension with an RVSP of 66 mmHg.  She was diuresed and is down to about 5 kg since discharge.  He reports persistent, dry cough.  She is on lisinopril but says she is taken in the past.  She denies any chest pain.  She does not have a local primary care provider but was encouraged to establish with community health and wellness.  07/07/2018  Tracey Gallegos returns today for follow-up.  Unfortunately she was recently hospitalized for acute on chronic systolic congestive heart failure.  I saw her during that hospitalization and had recommended cardiac catheterization.  Because of her cardiomyopathy and ongoing symptoms, I was concerned about a possible ischemic etiology.  Did undergo left and right heart catheterization on 06/28/2018.  This demonstrated a proximal to mid circumflex and first marginal lesion, treated with 2 [place] drug-eluting stents.  She remained in decompensated heart failure and was  additionally diuresed.  At discharge she is doing much better.  Her weight is down, blood pressure is well controlled and her breathing is normal.  She denies any chest pain.  PMHx:  Past Medical History:  Diagnosis Date  . CHF (congestive heart failure) ([place])   . Coronary artery disease   . Diabetes mellitus without complication ([place])   . Dyspnea   . Hypertension     Past Surgical History:  Procedure Laterality Date  . CARDIAC CATHETERIZATION  06/28/2018  . CORONARY STENT INTERVENTION N/A 06/28/2018   Procedure: CORONARY STENT INTERVENTION;  Surgeon: Leonie Man, MD;  Location: [facility];  Service: Cardiovascular;  Laterality: N/A;  . CORONARY STENT PLACEMENT  06/28/2018  . RIGHT/LEFT HEART CATH AND CORONARY ANGIOGRAPHY N/A 06/28/2018   Procedure: RIGHT/LEFT HEART CATH AND CORONARY ANGIOGRAPHY;  Surgeon: Leonie Man, MD;  Location: [facility];  Service: Cardiovascular;  Laterality: N/A;  . TOTAL KNEE ARTHROPLASTY Left 05/2016    FAMHx:  Family History  Problem Relation Age of Onset  . Diabetes Mother     SOCHx:   reports that she has never smoked. She has never used smokeless tobacco. She reports current alcohol use. She reports that she does not use drugs.  ALLERGIES:  Allergies  Allergen Reactions  . Lisinopril Cough    ROS: Pertinent items noted in HPI and remainder of comprehensive ROS otherwise negative.  HOME MEDS: Current Outpatient Medications on File Prior to Visit  Medication Sig Dispense Refill  . aspirin (ASPIRIN 81) 81 MG EC tablet Take 1 tablet (81  mg total) by mouth daily. 30 tablet 12  . atorvastatin (LIPITOR) 40 MG tablet Take 1 tablet (40 mg total) by mouth daily at 6 PM. 30 tablet 11  . Blood Glucose Monitoring Suppl (TRUE METRIX METER) DEVI 1 kit by Does not apply route 4 (four) times daily. 1 Device 0  . furosemide (LASIX) 40 MG tablet Take 1 tablet (40 mg total) by mouth 2 (two) times daily. 60 tablet 11  . glipiZIDE  (GLUCOTROL) 5 MG tablet Take 0.5 tablets (2.5 mg total) by mouth daily before breakfast. 30 tablet 11  . glucose blood (TRUE METRIX BLOOD GLUCOSE TEST) test strip Use as instructed 120 each 11  . losartan (COZAAR) 25 MG tablet Take 1 tablet (25 mg total) by mouth daily. 30 tablet 0  . metoprolol succinate (TOPROL-XL) 50 MG 24 hr tablet Take 1 tablet (50 mg total) by mouth daily. Take with or immediately following a meal. 30 tablet 0  . Omega-3 Fatty Acids (FISH OIL PO) Take 1,000 mg by mouth daily.     . potassium chloride (K-DUR) 10 MEQ tablet Take 1 tablet (10 mEq total) by mouth 2 (two) times daily. 60 tablet 11  . ticagrelor (BRILINTA) 90 MG TABS tablet Take 1 tablet (90 mg total) by mouth 2 (two) times daily. 60 tablet 1  . TRUEPLUS LANCETS 28G MISC 28 g by Does not apply route 4 (four) times daily. 120 each 11   No current facility-administered medications on file prior to visit.     LABS/IMAGING: No results found for this or any previous visit (from the past 48 hour(s)). No results found.  LIPID PANEL:    Component Value Date/Time   CHOL 202 (H) 04/27/2018 1513   TRIG 84 04/27/2018 1513   HDL 37 (L) 04/27/2018 1513   CHOLHDL 5.5 04/27/2018 1513   VLDL 17 04/27/2018 1513   LDLCALC 148 (H) 04/27/2018 1513     WEIGHTS: Wt Readings from Last 3 Encounters:  07/07/18 252 lb 6.4 oz (114.5 kg)  06/30/18 249 lb 12.5 oz (113.3 kg)  06/24/18 280 lb (127 kg)    VITALS: BP 123/78   Pulse 66   Ht '5\' 8"'$  (1.727 m)   Wt 252 lb 6.4 oz (114.5 kg)   BMI 38.38 kg/m   EXAM: General appearance: alert, no distress and morbidly obese Neck: no carotid bruit, no JVD and thyroid not enlarged, symmetric, no tenderness/mass/nodules Lungs: clear to auscultation bilaterally Heart: regular rate and rhythm Abdomen: soft, non-tender; bowel sounds normal; no masses,  no organomegaly Extremities: extremities normal, atraumatic, no cyanosis or edema Pulses: 2+ and symmetric Skin: Skin color,  texture, turgor normal. No rashes or lesions Neurologic: Grossly normal Psych: Pleasant  EKG: Deferred  ASSESSMENT: 1. Coronary artery disease status post DES x2 to the first marginal and circumflex arteries (06/2018) 2. Acute on chronic systolic congestive heart failure, LVEF 25 to 30% (04/2018) 3. NYHA class II-III symptoms 4. Morbid obesity 5. Poorly controlled hypertension 6. History of medication noncompliance  PLAN: 1.   Ms. Chawla was again admitted with acute on chronic combined systolic and diastolic congestive heart failure.  She underwent left and right heart catheterization and this demonstrated lesions in the circumflex and first marginal arteries.  She 2 drug-eluting stents placed.  She was aggressively diuresed.  After discharge she is done well and is on appropriate medications.  Her blood pressure is well controlled.  Will continue with this therapy with a plan to repeat an echocardiogram and follow-up  with her in 6 months.  Pixie Casino, MD, [facility], [place] Director of the Advanced Lipid Disorders &  Cardiovascular Risk Reduction Clinic Diplomate of the American Board of Clinical Lipidology Attending Cardiologist  Direct Dial: [phone]  Fax: [phone]  Website:  [email or web address] 07/07/2018, 2:16 PM

## 2018-07-07 NOTE — Patient Instructions (Signed)
Medication Instructions:  Continue current medications If you need a refill on your cardiac medications before your next appointment, please call your pharmacy.   Testing/Procedures: Your physician has requested that you have an echocardiogram. Echocardiography is a painless test that uses sound waves to create images of your heart. It provides your doctor with information about the size and shape of your heart and how well your heart's chambers and valves are working. This procedure takes approximately one hour. There are no restrictions for this procedure. -- this is to be completed June 2020  -- done at [address]  Follow-Up: At [facility], you and your health needs are our priority.  As part of our continuing mission to provide you with exceptional heart care, we have created designated Provider Care Teams.  These Care Teams include your primary Cardiologist (physician) and Advanced Practice Providers (APPs -  Physician Assistants and Nurse Practitioners) who all work together to provide you with the care you need, when you need it. You will need a follow up appointment in 6 months.  Please call our office 2 months in advance to schedule this appointment.  You may see Pixie Casino, MD or one of the following Advanced Practice Providers on your designated Care Team: [place], [place] . Fabian Sharp, PA-C  Any Other Special Instructions Will Be Listed Below (If Applicable).

## 2018-07-07 NOTE — Telephone Encounter (Signed)
Faxed order for maintenance cardiac rehab to [facility] @ [phone] - stent

## 2018-07-08 ENCOUNTER — Telehealth: Payer: Self-pay | Admitting: Internal Medicine

## 2018-07-08 NOTE — Telephone Encounter (Signed)
Called patient 3 times with no answer. I was unable to contact patient to verify if she would need a cab to her appt on 12/20 or if she would be driving herself.

## 2018-07-09 ENCOUNTER — Ambulatory Visit: Payer: Self-pay | Attending: Critical Care Medicine | Admitting: Critical Care Medicine

## 2018-07-09 ENCOUNTER — Encounter: Payer: Self-pay | Admitting: Critical Care Medicine

## 2018-07-09 VITALS — BP 120/85 | HR 73 | Temp 97.6°F | Ht 68.0 in | Wt 253.8 lb

## 2018-07-09 DIAGNOSIS — Z7902 Long term (current) use of antithrombotics/antiplatelets: Secondary | ICD-10-CM | POA: Insufficient documentation

## 2018-07-09 DIAGNOSIS — I509 Heart failure, unspecified: Secondary | ICD-10-CM

## 2018-07-09 DIAGNOSIS — I110 Hypertensive heart disease with heart failure: Secondary | ICD-10-CM

## 2018-07-09 DIAGNOSIS — Z7984 Long term (current) use of oral hypoglycemic drugs: Secondary | ICD-10-CM | POA: Insufficient documentation

## 2018-07-09 DIAGNOSIS — I5023 Acute on chronic systolic (congestive) heart failure: Secondary | ICD-10-CM

## 2018-07-09 DIAGNOSIS — I10 Essential (primary) hypertension: Secondary | ICD-10-CM

## 2018-07-09 DIAGNOSIS — R0602 Shortness of breath: Secondary | ICD-10-CM | POA: Insufficient documentation

## 2018-07-09 DIAGNOSIS — I2510 Atherosclerotic heart disease of native coronary artery without angina pectoris: Secondary | ICD-10-CM | POA: Insufficient documentation

## 2018-07-09 DIAGNOSIS — E119 Type 2 diabetes mellitus without complications: Secondary | ICD-10-CM

## 2018-07-09 DIAGNOSIS — I5042 Chronic combined systolic (congestive) and diastolic (congestive) heart failure: Secondary | ICD-10-CM | POA: Insufficient documentation

## 2018-07-09 DIAGNOSIS — I420 Dilated cardiomyopathy: Secondary | ICD-10-CM | POA: Insufficient documentation

## 2018-07-09 DIAGNOSIS — Z79899 Other long term (current) drug therapy: Secondary | ICD-10-CM | POA: Insufficient documentation

## 2018-07-09 DIAGNOSIS — E785 Hyperlipidemia, unspecified: Secondary | ICD-10-CM | POA: Insufficient documentation

## 2018-07-09 DIAGNOSIS — I5189 Other ill-defined heart diseases: Secondary | ICD-10-CM

## 2018-07-09 DIAGNOSIS — Z833 Family history of diabetes mellitus: Secondary | ICD-10-CM | POA: Insufficient documentation

## 2018-07-09 DIAGNOSIS — E876 Hypokalemia: Secondary | ICD-10-CM | POA: Insufficient documentation

## 2018-07-09 DIAGNOSIS — Z7982 Long term (current) use of aspirin: Secondary | ICD-10-CM | POA: Insufficient documentation

## 2018-07-09 LAB — GLUCOSE, POCT (MANUAL RESULT ENTRY): POC GLUCOSE: 145 mg/dL — AB (ref 70–99)

## 2018-07-09 MED ORDER — METOPROLOL SUCCINATE ER 50 MG PO TB24: 50.0000 mg | ORAL_TABLET | Freq: Every day | ORAL | 11 refills | Status: DC

## 2018-07-09 MED ORDER — TICAGRELOR 90 MG PO TABS: 90.0000 mg | ORAL_TABLET | Freq: Two times a day (BID) | ORAL | 4 refills | Status: DC

## 2018-07-09 MED ORDER — GLIPIZIDE 5 MG PO TABS: 5.0000 mg | ORAL_TABLET | Freq: Two times a day (BID) | ORAL | 11 refills | Status: DC

## 2018-07-09 MED ORDER — LOSARTAN POTASSIUM 25 MG PO TABS: 25.0000 mg | ORAL_TABLET | Freq: Every day | ORAL | 11 refills | Status: DC

## 2018-07-09 MED FILL — glipiZIDE 5 MG TABS: 5 | 15 days supply | Qty: 30 | Fill #0

## 2018-07-09 NOTE — Addendum Note (Signed)
Addended by: Asencion Noble E on: 07/09/2018 12:03 PM   Modules accepted: Level of Service

## 2018-07-09 NOTE — Assessment & Plan Note (Signed)
Coronary artery disease with involvement of the left circumflex artery status post drug-eluting stent  Per cardiology

## 2018-07-09 NOTE — Assessment & Plan Note (Signed)
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

## 2018-07-09 NOTE — Progress Notes (Signed)
Subjective:    Patient ID: Tracey Gallegos, female    DOB: 08-13-1955, 62 y.o.   MRN: [id number]  [facility]  Date of Telephone Encounter 07/02/18  See CM note.  No significant barriers identified other than CBGs 180s in AM  Date of 1st service: 07/09/2018   Admit Date:06/24/18 Discharge Date: 06/30/18  PCP: Karle Plumber  62 y.o.F here for post hosp f/u for CHF medical history significant ofchronic systolic heart failure (EF 25-30% by TTE on 04/28/2018), DM-2, hypertension presenting with the above-noted complaints. Per patient for the past 1 month she is noticed shortness of breath-mostly on exertion. However over the past few days-this is gradually worsened-she is now short of breath even walking a few feet. She has now started to develop shortness of breath at rest-and has also developed orthopnea for the past few nights. She has noted at least a 6 pound weight gain since he last saw her physician. She also has noted worsening lower extremity edema. Due to worsening of the symptoms-she presented to the ED-she was thought to have decompensated systolic heart failure and referred to the [facility] service for further evaluation and treatment.   1.  acute on chronic combined systolic and diastolicheart failure/severe dilated cardiomyopathy with ejection fraction 25 to 30% status post cath and PCI.   Left ventricular wedge pressure 24 to 26 mmHg.  Cath showsProx Cx to Mid Cx lesion is 90% stenosed with 90% stenosed side branch in Ost 1st Mrg.  A drug-eluting stent was successfully placed in the Ost 1st Mrg using a STENT SIERRA 2.25 X 23 MM (postdilated to 2.4 mm). Post intervention, there is a 0% residual stenosis.  A drug-eluting stent was successfully placed in the p-mCx using a STENT SIERRA 2.50 X 28 MM (postdilated to 2.9 mm). Post intervention, the side branch was reduced to 0% residual stenosis.  Ost Ramus to Ramus lesion is 55% stenosed.  LV end  diastolic pressure is severely elevated (acute on chronic diastolic heart failure). With known severely reduced EF by Echo (acute on chronic systolic heart failure) Hemodynamic findings consistent with moderate pulmonary hypertension. -LVEDP of 29 mmHg with wedge pressure of 242-26mHg -> consistent with acute on chronic combined systolic and diastolic heart failure  Continue dual antiplatelet therapy aspirin and Brilinta for 6 months.lasix 40 mg bid  2)hypertension on multiple meds continue stable  4)type 2 diabetes restart glipizide  5)hyperlipidemia continue statin to keep LDL less than 50  6)hypokalemia/hypomagnesemia repleted.    Congestive Heart Failure  Presents for follow-up visit. Associated symptoms include edema, fatigue, orthopnea, paroxysmal nocturnal dyspnea, shortness of breath and unexpected weight change. Pertinent negatives include no abdominal pain, chest pain, chest pressure, claudication, near-syncope or palpitations. The symptoms have been improving. Compliance with total regimen is 76-100%. Compliance problems include adherence to diet.  Compliance with diet is 51-75%. Compliance with exercise is 26-50%. Compliance with medications is 76-100%.    Past Medical History:  Diagnosis Date  . [place] ([place]estive heart failure) [place]   . Coronary artery disease   . [place] mellitus without complication [place] [place]   . Dyspnea   . Hypertension      Family History  Problem Relation Age of Onset  . Diabetes Mother      Social History   Socioeconomic History  . Marital status: Single    Spouse name: Not on file  . Number of children: Not on file  . Years of education: Not on file  . Highest education level: Not  on file  Occupational History  . Not on file  Social Needs  . Financial resource strain: Not on file  . Food insecurity:    Worry: Not on file    Inability: Not on file  . Transportation needs:    Medical: Not on file    Non-medical: Not on file    Tobacco Use  . Smoking status: Never Smoker  . Smokeless tobacco: Never Used  Substance and Sexual Activity  . Alcohol use: Yes    Comment: occasionally  . Drug use: Never  . Sexual activity: Not on file  Lifestyle  . Physical activity:    Days per week: Not on file    Minutes per session: Not on file  . Stress: Not on file  Relationships  . Social connections:    Talks on phone: Not on file    Gets together: Not on file    Attends religious service: Not on file    Active member of club or organization: Not on file    Attends meetings of clubs or organizations: Not on file    Relationship status: Not on file  . Intimate partner violence:    Fear of current or ex partner: Not on file    Emotionally abused: Not on file    Physically abused: Not on file    Forced sexual activity: Not on file  Other Topics Concern  . Not on file  Social History Narrative  . Not on file     Allergies  Allergen Reactions  . Lisinopril Cough     Outpatient Medications Prior to Visit  Medication Sig Dispense Refill  . aspirin (ASPIRIN 81) 81 MG EC tablet Take 1 tablet (81 mg total) by mouth daily. 30 tablet 12  . atorvastatin (LIPITOR) 40 MG tablet Take 1 tablet (40 mg total) by mouth daily at 6 PM. 30 tablet 11  . Blood Glucose Monitoring Suppl (TRUE METRIX METER) DEVI 1 kit by Does not apply route 4 (four) times daily. 1 Device 0  . furosemide (LASIX) 40 MG tablet Take 1 tablet (40 mg total) by mouth 2 (two) times daily. 60 tablet 11  . glucose blood (TRUE METRIX BLOOD GLUCOSE TEST) test strip Use as instructed 120 each 11  . Omega-3 Fatty Acids (FISH OIL PO) Take 1,000 mg by mouth daily.     . potassium chloride (K-DUR) 10 MEQ tablet Take 1 tablet (10 mEq total) by mouth 2 (two) times daily. 60 tablet 11  . TRUEPLUS LANCETS 28G MISC 28 g by Does not apply route 4 (four) times daily. 120 each 11  . glipiZIDE (GLUCOTROL) 5 MG tablet Take 0.5 tablets (2.5 mg total) by mouth daily before  breakfast. 30 tablet 11  . losartan (COZAAR) 25 MG tablet Take 1 tablet (25 mg total) by mouth daily. 30 tablet 0  . metoprolol succinate (TOPROL-XL) 50 MG 24 hr tablet Take 1 tablet (50 mg total) by mouth daily. Take with or immediately following a meal. 30 tablet 0  . ticagrelor (BRILINTA) 90 MG TABS tablet Take 1 tablet (90 mg total) by mouth 2 (two) times daily. 60 tablet 1   No facility-administered medications prior to visit.      Review of Systems  Constitutional: Positive for fatigue and unexpected weight change.  Respiratory: Positive for shortness of breath.   Cardiovascular: Negative for chest pain, palpitations, claudication and near-syncope.  Gastrointestinal: Negative for abdominal pain.       Objective:   Physical Exam  Vitals:   07/09/18 0845 07/09/18 1201  BP: 140/90 120/85  Pulse: 73   Temp: 97.6 F (36.4 C)   TempSrc: Oral   SpO2: 94%   Weight: 253 lb 12.8 oz (115.1 kg)   Height: '5\' 8"'$  (1.727 m)     Gen: Pleasant, obese, in no distress,  normal affect  ENT: No lesions,  mouth clear,  oropharynx clear, no postnasal drip  Neck: No JVD, no TMG, no carotid bruits  Lungs: No use of accessory muscles, no dullness to percussion, clear without rales or rhonchi  Cardiovascular: RRR, heart sounds normal, no murmur or gallops, no peripheral edema, bruising around right radial artery site, pulse is good.  No coolness to hand, good cap refill to R hand   Abdomen: soft and NT, no HSM,  BS normal  Musculoskeletal: No deformities, no cyanosis or clubbing  Neuro: alert, non focal  Skin: Warm, no lesions or rashes  BMP Latest Ref Rng & Units 06/30/2018 06/29/2018 06/28/2018  Glucose 70 - 99 mg/dL 136(H) 132(H) 201(H)  BUN 8 - 23 mg/dL 26(H) 21 21  Creatinine 0.44 - 1.00 mg/dL 1.18(H) 1.27(H) 1.40(H)  BUN/Creat Ratio 12 - 28 - - -  Sodium 135 - 145 mmol/L 140 140 140  Potassium 3.5 - 5.1 mmol/L 4.0 4.0 3.8  Chloride 98 - 111 mmol/L 103 103 104  CO2 22 - 32  mmol/L '27 26 26  '$ Calcium 8.9 - 10.3 mg/dL 9.5 9.5 9.0   CBC Latest Ref Rng & Units 06/30/2018 06/29/2018 06/28/2018  WBC 4.0 - 10.5 K/uL 5.4 6.9 5.3  Hemoglobin 12.0 - 15.0 g/dL 13.4 13.7 11.9(L)  Hematocrit 36.0 - 46.0 % 44.2 42.9 37.7  Platelets 150 - 400 K/uL 280 250 238   Wt Readings from Last 3 Encounters:  07/09/18 253 lb 12.8 oz (115.1 kg)  07/07/18 252 lb 6.4 oz (114.5 kg)  06/30/18 249 lb 12.5 oz (113.3 kg)    CXR 06/22/18 Mild congestion and edema   12/9 Echo Left ventricle:  The cavity size was mildly dilated. Systolic function was severely reduced. The estimated ejection fraction was in the range of 25% to 30%.  Moderate diffuse hypokinesis. There was a reduced contribution of atrial contraction to ventricular filling, due to increased ventricular diastolic pressure or atrial contractile dysfunction. Doppler parameters are consistent with a reversible restrictive pattern, indicative of decreased left ventricular diastolic compliance and/or increased left atrial pressure (grade 3 diastolic dysfunction). Doppler parameters are consistent with high ventricular filling pressure.  ------------------------------------------------------------------- Aortic valve:   Trileaflet; mildly thickened, mildly calcified leaflets. Mobility was not restricted.  Doppler:  Transvalvular velocity was within the normal range. There was no stenosis. There was no regurgitation.  ------------------------------------------------------------------- Aorta:  Aortic root: The aortic root was normal in size.  ------------------------------------------------------------------- Mitral valve:   Calcified annulus. Mobility was not restricted. Doppler:  Transvalvular velocity was within the normal range. There was no evidence for stenosis. There was mild regurgitation.    Peak gradient (D): 4 mm Hg.  ------------------------------------------------------------------- Left atrium:  The atrium  was severely dilated.  ------------------------------------------------------------------- Right ventricle:  The cavity size was normal. Wall thickness was normal. Systolic function was normal.  ------------------------------------------------------------------- Pulmonic valve:    Structurally normal valve.   Cusp separation was normal.  Doppler:  Transvalvular velocity was within the normal range. There was no evidence for stenosis. There was no regurgitation.  ------------------------------------------------------------------- Tricuspid valve:   Structurally normal valve.    Doppler: Transvalvular velocity was within the normal  range. There was mild regurgitation.  ------------------------------------------------------------------- Pulmonary artery:   The main pulmonary artery was normal-sized. Systolic pressure was within the normal range.  ------------------------------------------------------------------- Right atrium:  The atrium was normal in size.  ------------------------------------------------------------------- Pericardium:  There was no pericardial effusion.  ------------------------------------------------------------------- Systemic veins: Inferior vena cava: The vessel was dilated. The respirophasic diameter changes were blunted (< 50%), consistent with elevated central venous pressure.  ------------------------------------------------------------------- 12/9 Heart Cath:  Prox Cx to Mid Cx lesion is 90% stenosed with 90% stenosed side branch in Ost 1st Mrg.  A drug-eluting stent was successfully placed in the Ost 1st Mrg using a STENT SIERRA 2.25 X 23 MM (postdilated to 2.4 mm). Post intervention, there is a 0% residual stenosis.  A drug-eluting stent was successfully placed in the p-mCx using a STENT SIERRA 2.50 X 28 MM (postdilated to 2.9 mm). Post intervention, the side branch was reduced to 0% residual stenosis.  Ost Ramus to Ramus lesion is 55%  stenosed.  LV end diastolic pressure is severely elevated (acute on chronic diastolic heart failure). With known severely reduced EF by Echo (acute on chronic systolic heart failure)  Hemodynamic findings consistent with moderate pulmonary hypertension. -LVEDP of 29 mmHg with wedge pressure of 242-49mHg -> consistent with acute on chronic combined systolic and diastolic heart failure       Assessment & Plan:  [facility] as well as any outside material available during this office visit and agree with the  radiology impressions.   CHF (congestive heart failure), NYHA class III, acute on chronic, systolic (HCC) History of acute on chronic systolic and diastolic heart failure with exacerbations including medication and dietary nonadherence along with recently discovered ischemia of the left circumflex artery  Now the patient is improved with medication adherence improve dietary compliance and placement of a stent drug-eluting into the left circumflex artery per cardiology  Plan Continue diuretic therapy as prescribed Continue beta-blocker Continue hypertensive control Continue antiplatelet therapy for 6 months on a dual basis with Brilinta and aspirin Continued dietary compliance and exercise recommended   HTN (hypertension) Essential hypertension with associated diastolic heart failure Plan Review heart failure assessment  CAD (coronary artery disease) Coronary artery disease with involvement of the left circumflex artery status post drug-eluting stent  Per cardiology  Type 2 diabetes mellitus without complication, without long-term current use of insulin (HCC) Type 2 diabetes not currently on insulin and not tolerant of metformin secondary to liver side effects  Plan Increase glipizide to 5 mg twice daily at meals Referral to clinical pharmacist for further diabetic education was given   MTorrywas seen today for congestive  heart failure.  Diagnoses and all orders for this visit:  CHF (congestive heart failure), NYHA class III, acute on chronic, systolic (HCC) -     Basic metabolic panel; Future -     Basic metabolic panel  Type 2 diabetes mellitus without complication, without long-term current use of insulin (HCC) -     POCT glucose (manual entry) -     Basic metabolic panel; Future -     Basic metabolic panel  Coronary artery disease involving native coronary artery of native heart without angina pectoris -     CBC with Differential/Platelet; Future -     CBC with Differential/Platelet  Hypertensive heart disease with acute on chronic systolic congestive heart failure (HCC)  Diastolic dysfunction  Acute on chronic congestive heart failure, unspecified heart failure type ([place]  Essential hypertension  Other orders -     ticagrelor (BRILINTA) 90 MG TABS tablet; Take 1 tablet (90 mg total) by mouth 2 (two) times daily. -     metoprolol succinate (TOPROL-XL) 50 MG 24 hr tablet; Take 1 tablet (50 mg total) by mouth daily. Take with or immediately following a meal. -     losartan (COZAAR) 25 MG tablet; Take 1 tablet (25 mg total) by mouth daily. -     glipiZIDE (GLUCOTROL) 5 MG tablet; Take 1 tablet (5 mg total) by mouth 2 (two) times daily before a meal.  Labs today BMP CBC Cont losartan at '25mg'$  dose  I had an extended discussion with the patient and or family lasting 10 minutes of a 25 minute visit including:   Diabetic and hypertension diet education along with explanation of patient's current disease state and need for medication adherence

## 2018-07-09 NOTE — Assessment & Plan Note (Signed)
History of acute on chronic systolic and diastolic heart failure with exacerbations including medication and dietary nonadherence along with recently discovered ischemia of the left circumflex artery  Now the patient is improved with medication adherence improve dietary compliance and placement of a stent drug-eluting into the left circumflex artery per cardiology  Plan Continue diuretic therapy as prescribed Continue beta-blocker Continue hypertensive control Continue antiplatelet therapy for 6 months on a dual basis with Brilinta and aspirin Continued dietary compliance and exercise recommended

## 2018-07-09 NOTE — Patient Instructions (Signed)
Continue Losartan at 25mg  daily Increase glipizide to 5 mg twice daily at meals Continue all other medications as prescribed Laboratory today will be include a basic metabolic panel and complete blood work Keep your visit with your primary care provider on December 31 Follow a diabetic and heart failure diet as described below   Diabetes Mellitus and Nutrition, Adult When you have diabetes (diabetes mellitus), it is very important to have healthy eating habits because your blood sugar (glucose) levels are greatly affected by what you eat and drink. Eating healthy foods in the appropriate amounts, at about the same times every day, can help you:  Control your blood glucose.  Lower your risk of heart disease.  Improve your blood pressure.  Reach or maintain a healthy weight. Every person with diabetes is different, and each person has different needs for a meal plan. Your health care provider may recommend that you work with a diet and nutrition specialist (dietitian) to make a meal plan that is best for you. Your meal plan may vary depending on factors such as:  The calories you need.  The medicines you take.  Your weight.  Your blood glucose, blood pressure, and cholesterol levels.  Your activity level.  Other health conditions you have, such as heart or kidney disease. How do carbohydrates affect me? Carbohydrates, also called carbs, affect your blood glucose level more than any other type of food. Eating carbs naturally raises the amount of glucose in your blood. Carb counting is a method for keeping track of how many carbs you eat. Counting carbs is important to keep your blood glucose at a healthy level, especially if you use insulin or take certain oral diabetes medicines. It is important to know how many carbs you can safely have in each meal. This is different for every person. Your dietitian can help you calculate how many carbs you should have at each meal and for each  snack. Foods that contain carbs include:  Bread, cereal, rice, pasta, and crackers.  Potatoes and corn.  Peas, beans, and lentils.  Milk and yogurt.  Fruit and juice.  Desserts, such as cakes, cookies, ice cream, and candy. How does alcohol affect me? Alcohol can cause a sudden decrease in blood glucose (hypoglycemia), especially if you use insulin or take certain oral diabetes medicines. Hypoglycemia can be a life-threatening condition. Symptoms of hypoglycemia (sleepiness, dizziness, and confusion) are similar to symptoms of having too much alcohol. If your health care provider says that alcohol is safe for you, follow these guidelines:  Limit alcohol intake to no more than 1 drink per day for nonpregnant women and 2 drinks per day for men. One drink equals 12 oz of beer, 5 oz of wine, or 1 oz of hard liquor.  Do not drink on an empty stomach.  Keep yourself hydrated with water, diet soda, or unsweetened iced tea.  Keep in mind that regular soda, juice, and other mixers may contain a lot of sugar and must be counted as carbs. What are tips for following this plan?  Reading food labels  Start by checking the serving size on the "Nutrition Facts" label of packaged foods and drinks. The amount of calories, carbs, fats, and other nutrients listed on the label is based on one serving of the item. Many items contain more than one serving per package.  Check the total grams (g) of carbs in one serving. You can calculate the number of servings of carbs in one serving by dividing  the total carbs by 15. For example, if a food has 30 g of total carbs, it would be equal to 2 servings of carbs.  Check the number of grams (g) of saturated and trans fats in one serving. Choose foods that have low or no amount of these fats.  Check the number of milligrams (mg) of salt (sodium) in one serving. Most people should limit total sodium intake to less than 2,300 mg per day.  Always check the  nutrition information of foods labeled as "low-fat" or "nonfat". These foods may be higher in added sugar or refined carbs and should be avoided.  Talk to your dietitian to identify your daily goals for nutrients listed on the label. Shopping  Avoid buying canned, premade, or processed foods. These foods tend to be high in fat, sodium, and added sugar.  Shop around the outside edge of the grocery store. This includes fresh fruits and vegetables, bulk grains, fresh meats, and fresh dairy. Cooking  Use low-heat cooking methods, such as baking, instead of high-heat cooking methods like deep frying.  Cook using healthy oils, such as olive, canola, or sunflower oil.  Avoid cooking with butter, cream, or high-fat meats. Meal planning  Eat meals and snacks regularly, preferably at the same times every day. Avoid going long periods of time without eating.  Eat foods high in fiber, such as fresh fruits, vegetables, beans, and whole grains. Talk to your dietitian about how many servings of carbs you can eat at each meal.  Eat 4-6 ounces (oz) of lean protein each day, such as lean meat, chicken, fish, eggs, or tofu. One oz of lean protein is equal to: ? 1 oz of meat, chicken, or fish. ? 1 egg. ?  cup of tofu.  Eat some foods each day that contain healthy fats, such as avocado, nuts, seeds, and fish. Lifestyle  Check your blood glucose regularly.  Exercise regularly as told by your health care provider. This may include: ? 150 minutes of moderate-intensity or vigorous-intensity exercise each week. This could be brisk walking, biking, or water aerobics. ? Stretching and doing strength exercises, such as yoga or weightlifting, at least 2 times a week.  Take medicines as told by your health care provider.  Do not use any products that contain nicotine or tobacco, such as cigarettes and e-cigarettes. If you need help quitting, ask your health care provider.  Work with a Social worker or diabetes  educator to identify strategies to manage stress and any emotional and social challenges. Questions to ask a health care provider  Do I need to meet with a diabetes educator?  Do I need to meet with a dietitian?  What number can I call if I have questions?  When are the best times to check my blood glucose? Where to find more information:  American Diabetes Association: diabetes.org  Academy of Nutrition and Dietetics: www.eatright.[employer] of Diabetes and Digestive and Kidney Diseases (NIH): [employer] Summary  A healthy meal plan will help you control your blood glucose and maintain a healthy lifestyle.  Working with a diet and nutrition specialist (dietitian) can help you make a meal plan that is best for you.  Keep in mind that carbohydrates (carbs) and alcohol have immediate effects on your blood glucose levels. It is important to count carbs and to use alcohol carefully. This information is not intended to replace advice given to you by your health care provider. Make sure you discuss any questions you have with  your health care provider. Document Released: 04/03/2005 Document Revised: 02/04/2017 Document Reviewed: 08/11/2016 Elsevier Interactive Patient Education  2019 [employer].  Hypertension Hypertension, commonly called high blood pressure, is when the force of blood pumping through the arteries is too strong. The arteries are the blood vessels that carry blood from the heart throughout the body. Hypertension forces the heart to work harder to pump blood and may cause arteries to become narrow or stiff. Having untreated or uncontrolled hypertension can cause heart attacks, strokes, kidney disease, and other problems. A blood pressure reading consists of a higher number over a lower number. Ideally, your blood pressure should be below 120/80. The first ("top") number is called the systolic pressure. It is a measure of the pressure in your arteries as  your heart beats. The second ("bottom") number is called the diastolic pressure. It is a measure of the pressure in your arteries as the heart relaxes. What are the causes? The cause of this condition is not known. What increases the risk? Some risk factors for high blood pressure are under your control. Others are not. Factors you can change  Smoking.  Having type 2 diabetes mellitus, high cholesterol, or both.  Not getting enough exercise or physical activity.  Being overweight.  Having too much fat, sugar, calories, or salt (sodium) in your diet.  Drinking too much alcohol. Factors that are difficult or impossible to change  Having chronic kidney disease.  Having a family history of high blood pressure.  Age. Risk increases with age.  Race. You may be at higher risk if you are African-American.  Gender. Men are at higher risk than women before age 45. After age 47, women are at higher risk than men.  Having obstructive sleep apnea.  Stress. What are the signs or symptoms? Extremely high blood pressure (hypertensive crisis) may cause:  Headache.  Anxiety.  Shortness of breath.  Nosebleed.  Nausea and vomiting.  Severe chest pain.  Jerky movements you cannot control (seizures). How is this diagnosed? This condition is diagnosed by measuring your blood pressure while you are seated, with your arm resting on a surface. The cuff of the blood pressure monitor will be placed directly against the skin of your upper arm at the level of your heart. It should be measured at least twice using the same arm. Certain conditions can cause a difference in blood pressure between your right and left arms. Certain factors can cause blood pressure readings to be lower or higher than normal (elevated) for a short period of time:  When your blood pressure is higher when you are in a health care provider's office than when you are at home, this is called white coat hypertension. Most  people with this condition do not need medicines.  When your blood pressure is higher at home than when you are in a health care provider's office, this is called masked hypertension. Most people with this condition may need medicines to control blood pressure. If you have a high blood pressure reading during one visit or you have normal blood pressure with other risk factors:  You may be asked to return on a different day to have your blood pressure checked again.  You may be asked to monitor your blood pressure at home for 1 week or longer. If you are diagnosed with hypertension, you may have other blood or imaging tests to help your health care provider understand your overall risk for other conditions. How is this treated? This condition  is treated by making healthy lifestyle changes, such as eating healthy foods, exercising more, and reducing your alcohol intake. Your health care provider may prescribe medicine if lifestyle changes are not enough to get your blood pressure under control, and if:  Your systolic blood pressure is above 130.  Your diastolic blood pressure is above 80. Your personal target blood pressure may vary depending on your medical conditions, your age, and other factors. Follow these instructions at home: Eating and drinking   Eat a diet that is high in fiber and potassium, and low in sodium, added sugar, and fat. An example eating plan is called the DASH (Dietary Approaches to Stop Hypertension) diet. To eat this way: ? Eat plenty of fresh fruits and vegetables. Try to fill half of your plate at each meal with fruits and vegetables. ? Eat whole grains, such as whole wheat pasta, brown rice, or whole grain bread. Fill about one quarter of your plate with whole grains. ? Eat or drink low-fat dairy products, such as skim milk or low-fat yogurt. ? Avoid fatty cuts of meat, processed or cured meats, and poultry with skin. Fill about one quarter of your plate with lean  proteins, such as fish, chicken without skin, beans, eggs, and tofu. ? Avoid premade and processed foods. These tend to be higher in sodium, added sugar, and fat.  Reduce your daily sodium intake. Most people with hypertension should eat less than 1,500 mg of sodium a day.  Limit alcohol intake to no more than 1 drink a day for nonpregnant women and 2 drinks a day for men. One drink equals 12 oz of beer, 5 oz of wine, or 1 oz of hard liquor. Lifestyle   Work with your health care provider to maintain a healthy body weight or to lose weight. Ask what an ideal weight is for you.  Get at least 30 minutes of exercise that causes your heart to beat faster (aerobic exercise) most days of the week. Activities may include walking, swimming, or biking.  Include exercise to strengthen your muscles (resistance exercise), such as pilates or lifting weights, as part of your weekly exercise routine. Try to do these types of exercises for 30 minutes at least 3 days a week.  Do not use any products that contain nicotine or tobacco, such as cigarettes and e-cigarettes. If you need help quitting, ask your health care provider.  Monitor your blood pressure at home as told by your health care provider.  Keep all follow-up visits as told by your health care provider. This is important. Medicines  Take over-the-counter and prescription medicines only as told by your health care provider. Follow directions carefully. Blood pressure medicines must be taken as prescribed.  Do not skip doses of blood pressure medicine. Doing this puts you at risk for problems and can make the medicine less effective.  Ask your health care provider about side effects or reactions to medicines that you should watch for. Contact a health care provider if:  You think you are having a reaction to a medicine you are taking.  You have headaches that keep coming back (recurring).  You feel dizzy.  You have swelling in your  ankles.  You have trouble with your vision. Get help right away if:  You develop a severe headache or confusion.  You have unusual weakness or numbness.  You feel faint.  You have severe pain in your chest or abdomen.  You vomit repeatedly.  You have trouble breathing.  Summary  Hypertension is when the force of blood pumping through your arteries is too strong. If this condition is not controlled, it may put you at risk for serious complications.  Your personal target blood pressure may vary depending on your medical conditions, your age, and other factors. For most people, a normal blood pressure is less than 120/80.  Hypertension is treated with lifestyle changes, medicines, or a combination of both. Lifestyle changes include weight loss, eating a healthy, low-sodium diet, exercising more, and limiting alcohol. This information is not intended to replace advice given to you by your health care provider. Make sure you discuss any questions you have with your health care provider. Document Released: 07/07/2005 Document Revised: 06/04/2016 Document Reviewed: 06/04/2016 Elsevier Interactive Patient Education  2019 [employer].

## 2018-07-09 NOTE — Assessment & Plan Note (Signed)
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

## 2018-07-10 LAB — CBC WITH DIFFERENTIAL/PLATELET
BASOS ABS: 0.1 10*3/uL (ref 0.0–0.2)
Basos: 2 %
EOS (ABSOLUTE): 0.6 10*3/uL — ABNORMAL HIGH (ref 0.0–0.4)
Eos: 12 %
Hematocrit: 47.4 % — ABNORMAL HIGH (ref 34.0–46.6)
Hemoglobin: 14.6 g/dL (ref 11.1–15.9)
Immature Grans (Abs): 0 10*3/uL (ref 0.0–0.1)
Immature Granulocytes: 0 %
Lymphocytes Absolute: 1.7 10*3/uL (ref 0.7–3.1)
Lymphs: 38 %
MCH: 25.9 pg — ABNORMAL LOW (ref 26.6–33.0)
MCHC: 30.8 g/dL — ABNORMAL LOW (ref 31.5–35.7)
MCV: 84 fL (ref 79–97)
Monocytes Absolute: 0.5 10*3/uL (ref 0.1–0.9)
Monocytes: 10 %
Neutrophils Absolute: 1.7 10*3/uL (ref 1.4–7.0)
Neutrophils: 38 %
Platelets: 242 10*3/uL (ref 150–450)
RBC: 5.63 x10E6/uL — AB (ref 3.77–5.28)
RDW: 16.9 % — ABNORMAL HIGH (ref 12.3–15.4)
WBC: 4.5 10*3/uL (ref 3.4–10.8)

## 2018-07-10 LAB — BASIC METABOLIC PANEL
BUN/Creatinine Ratio: 20 (ref 12–28)
BUN: 22 mg/dL (ref 8–27)
CHLORIDE: 105 mmol/L (ref 96–106)
CO2: 21 mmol/L (ref 20–29)
Calcium: 9.7 mg/dL (ref 8.7–10.3)
Creatinine, Ser: 1.08 mg/dL — ABNORMAL HIGH (ref 0.57–1.00)
GFR calc Af Amer: 63 mL/min/{1.73_m2} (ref >59)
GFR calc non Af Amer: 55 mL/min/{1.73_m2} — ABNORMAL LOW (ref >59)
Glucose: 144 mg/dL — ABNORMAL HIGH (ref 65–99)
Potassium: 3.8 mmol/L (ref 3.5–5.2)
Sodium: 145 mmol/L — ABNORMAL HIGH (ref 134–144)

## 2018-07-19 ENCOUNTER — Telehealth (HOSPITAL_COMMUNITY): Payer: Self-pay | Admitting: *Deleted

## 2018-07-20 ENCOUNTER — Other Ambulatory Visit: Payer: Self-pay | Admitting: Internal Medicine

## 2018-07-20 ENCOUNTER — Ambulatory Visit: Payer: Self-pay | Attending: Internal Medicine | Admitting: Internal Medicine

## 2018-07-20 ENCOUNTER — Encounter: Payer: Self-pay | Admitting: Internal Medicine

## 2018-07-20 VITALS — BP 139/89 | HR 66 | Temp 97.6°F | Resp 16 | Wt 259.6 lb

## 2018-07-20 DIAGNOSIS — E669 Obesity, unspecified: Secondary | ICD-10-CM

## 2018-07-20 DIAGNOSIS — Z7984 Long term (current) use of oral hypoglycemic drugs: Secondary | ICD-10-CM | POA: Insufficient documentation

## 2018-07-20 DIAGNOSIS — I272 Pulmonary hypertension, unspecified: Secondary | ICD-10-CM | POA: Insufficient documentation

## 2018-07-20 DIAGNOSIS — E119 Type 2 diabetes mellitus without complications: Secondary | ICD-10-CM

## 2018-07-20 DIAGNOSIS — Z79899 Other long term (current) drug therapy: Secondary | ICD-10-CM | POA: Insufficient documentation

## 2018-07-20 DIAGNOSIS — Z23 Encounter for immunization: Secondary | ICD-10-CM

## 2018-07-20 DIAGNOSIS — I10 Essential (primary) hypertension: Secondary | ICD-10-CM

## 2018-07-20 DIAGNOSIS — R7989 Other specified abnormal findings of blood chemistry: Secondary | ICD-10-CM

## 2018-07-20 DIAGNOSIS — Z955 Presence of coronary angioplasty implant and graft: Secondary | ICD-10-CM | POA: Insufficient documentation

## 2018-07-20 DIAGNOSIS — I2510 Atherosclerotic heart disease of native coronary artery without angina pectoris: Secondary | ICD-10-CM

## 2018-07-20 DIAGNOSIS — Z1239 Encounter for other screening for malignant neoplasm of breast: Secondary | ICD-10-CM

## 2018-07-20 DIAGNOSIS — I5042 Chronic combined systolic (congestive) and diastolic (congestive) heart failure: Secondary | ICD-10-CM

## 2018-07-20 DIAGNOSIS — R945 Abnormal results of liver function studies: Secondary | ICD-10-CM | POA: Insufficient documentation

## 2018-07-20 DIAGNOSIS — Z1331 Encounter for screening for depression: Secondary | ICD-10-CM

## 2018-07-20 DIAGNOSIS — Z7982 Long term (current) use of aspirin: Secondary | ICD-10-CM | POA: Insufficient documentation

## 2018-07-20 DIAGNOSIS — I110 Hypertensive heart disease with heart failure: Secondary | ICD-10-CM | POA: Insufficient documentation

## 2018-07-20 DIAGNOSIS — Z6839 Body mass index (BMI) 39.0-39.9, adult: Secondary | ICD-10-CM

## 2018-07-20 DIAGNOSIS — Z2821 Immunization not carried out because of patient refusal: Secondary | ICD-10-CM

## 2018-07-20 LAB — GLUCOSE, POCT (MANUAL RESULT ENTRY): POC Glucose: 127 mg/dl — AB (ref 70–99)

## 2018-07-20 MED ORDER — TETANUS-DIPHTH-ACELL PERTUSSIS 5-2.5-18.5 LF-MCG/0.5 IM SUSP: 0.5000 mL | INTRAMUSCULAR | 0 refills | Status: DC

## 2018-07-20 MED ORDER — TICAGRELOR 90 MG PO TABS: 90.0000 mg | ORAL_TABLET | Freq: Two times a day (BID) | ORAL | 4 refills | Status: DC

## 2018-07-20 MED ORDER — LOSARTAN POTASSIUM 50 MG PO TABS: 50.0000 mg | ORAL_TABLET | Freq: Every day | ORAL | 6 refills | Status: DC

## 2018-07-20 MED FILL — LOSARTAN POTASSIUM 50 MG TA: 50 | 30 days supply | Qty: 30 | Fill #0

## 2018-07-20 NOTE — Progress Notes (Signed)
Patient ID: Tracey Gallegos, female    DOB: 1955-10-01  MRN: [id number]  CC: Hypertension and Diabetes   Subjective: Tracey Gallegos is a 62 y.o. 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  Brilinta is very expensive.  She wonders if she can buy it online cheaper -has bottle for Losartan 50 mg and 25 mg.  She is taking the 25 mg -limits salt in the foods -gain 6 lbs since last visit No increase LE edema.  SOB "on occasion."  No PND/orthopnea. Sleeps on 1 pillow Saw Dr. Debara Pickett earlier this mth  DM: checks BS in a.m before BF.  Gives range 109-160.  Compliant with Glipizide Eating habits:  "needs improvement." She does not eat out much.  Does not eat regularly then over eats when she does.   Last eye exam was 2 yrs ago.  No blurred vision.  Will have insurance with [place] 07/21/2018 and is agreeable to referral. Activity level:  Not very active. "I still get winded when I do some things and I have to stop.  Not sure if it is anxiety or in in mind."  Got a call yesterday from a RN regarding cardiac rehab. Plans to call her back today.  Pos Dep Screen: Positive on last visit.  Patient reports that this is not an ongoing or major issue for her at this time.  HM:  Agreeable to Tdap and Pneumovax.  declineds flu.  Needs MMG.  Needs PAP.  She also needs colon cancer screening but will hold off on colonoscopy referral given that she was just started on DAPT for DES  Patient Active Problem List   Diagnosis Date Noted  . CAD (coronary artery disease) 07/09/2018  . Hypertensive heart disease with acute on chronic systolic congestive heart failure ([place]) 06/24/2018  . Type 2  diabetes mellitus without complication, without long-term current use of insulin ([place]) 06/24/2018  . CHF (congestive heart failure), NYHA class III, acute on chronic, systolic ([place]) 92/05/9416  . HTN (hypertension) 06/24/2018  . Morbid obesity ([place])      Current Outpatient Medications on File Prior to Visit  Medication Sig Dispense Refill  . aspirin (ASPIRIN 81) 81 MG EC tablet Take 1 tablet (81 mg total) by mouth daily. 30 tablet 12  . atorvastatin (LIPITOR) 40 MG tablet Take 1 tablet (40 mg total) by mouth daily at 6 PM. 30 tablet 11  . Blood Glucose Monitoring Suppl (TRUE METRIX METER) DEVI 1 kit by Does not apply route 4 (four) times daily. 1 Device 0  . furosemide (LASIX) 40 MG tablet Take 1 tablet (40 mg total) by mouth 2 (two) times daily. 60 tablet 11  . glipiZIDE (GLUCOTROL) 5 MG tablet Take 1 tablet (5 mg total) by mouth 2 (two) times daily before a meal. 30 tablet 11  . glucose blood (TRUE METRIX BLOOD GLUCOSE TEST) test strip Use as instructed 120 each 11  . losartan (COZAAR) 25 MG tablet Take 1 tablet (25 mg total) by mouth daily. 30 tablet 11  . metoprolol succinate (TOPROL-XL) 50 MG 24 hr tablet Take 1 tablet (50 mg total) by mouth daily. Take with or immediately following a meal. 30 tablet  11  . Omega-3 Fatty Acids (FISH OIL PO) Take 1,000 mg by mouth daily.     . potassium chloride (K-DUR) 10 MEQ tablet Take 1 tablet (10 mEq total) by mouth 2 (two) times daily. 60 tablet 11  . ticagrelor (BRILINTA) 90 MG TABS tablet Take 1 tablet (90 mg total) by mouth 2 (two) times daily. 60 tablet 4  . TRUEPLUS LANCETS 28G MISC 28 g by Does not apply route 4 (four) times daily. 120 each 11   No current facility-administered medications on file prior to visit.     Allergies  Allergen Reactions  . Lisinopril Cough    Social History   Socioeconomic History  . Marital status: Single    Spouse name: Not on file  . Number of children: Not on file  . Years of education: Not on file  .  Highest education level: Not on file  Occupational History  . Not on file  Social Needs  . Financial resource strain: Not on file  . Food insecurity:    Worry: Not on file    Inability: Not on file  . Transportation needs:    Medical: Not on file    Non-medical: Not on file  Tobacco Use  . Smoking status: Never Smoker  . Smokeless tobacco: Never Used  Substance and Sexual Activity  . Alcohol use: Yes    Comment: occasionally  . Drug use: Never  . Sexual activity: Not on file  Lifestyle  . Physical activity:    Days per week: Not on file    Minutes per session: Not on file  . Stress: Not on file  Relationships  . Social connections:    Talks on phone: Not on file    Gets together: Not on file    Attends religious service: Not on file    Active member of club or organization: Not on file    Attends meetings of clubs or organizations: Not on file    Relationship status: Not on file  . Intimate partner violence:    Fear of current or ex partner: Not on file    Emotionally abused: Not on file    Physically abused: Not on file    Forced sexual activity: Not on file  Other Topics Concern  . Not on file  Social History Narrative  . Not on file    Family History  Problem Relation Age of Onset  . Diabetes Mother     Past Surgical History:  Procedure Laterality Date  . CARDIAC CATHETERIZATION  06/28/2018  . CORONARY STENT INTERVENTION N/A 06/28/2018   Procedure: CORONARY STENT INTERVENTION;  Surgeon: Leonie Man, MD;  Location: [facility];  Service: Cardiovascular;  Laterality: N/A;  . CORONARY STENT PLACEMENT  06/28/2018  . RIGHT/LEFT HEART CATH AND CORONARY ANGIOGRAPHY N/A 06/28/2018   Procedure: RIGHT/LEFT HEART CATH AND CORONARY ANGIOGRAPHY;  Surgeon: Leonie Man, MD;  Location: [facility];  Service: Cardiovascular;  Laterality: N/A;  . TOTAL KNEE ARTHROPLASTY Left 05/2016    ROS: Review of Systems Negative except as above PHYSICAL  EXAM: BP 139/89   Pulse 66   Temp 97.6 F (36.4 C) (Oral)   Resp 16   Wt 259 lb 9.6 oz (117.8 kg)   SpO2 95%   BMI 39.47 kg/m   Wt Readings from Last 3 Encounters:  07/20/18 259 lb 9.6 oz (117.8 kg)  07/09/18 253 lb 12.8 oz (115.1 kg)  07/07/18 252 lb 6.4 oz (114.5  kg)   BP 130/90 Physical Exam  General appearance - alert, well appearing, and in no distress Mental status - normal mood, behavior, speech, dress, motor activity, and thought processes Neck - supple, no significant adenopathy Chest - clear to auscultation, no wheezes, rales or rhonchi, symmetric air entry Heart - normal rate, regular rhythm, normal S1, S2, no murmurs, rubs, clicks or gallops Extremities -trace lower extremity edema  Depression screen [facility] 2/9 06/24/2018 06/08/2018  Decreased Interest 2 0  Down, Depressed, Hopeless 1 1  PHQ - 2 Score 3 1  Altered sleeping 2 2  Tired, decreased energy 3 3  Change in appetite 2 2  Feeling bad or failure about yourself  0 0  Trouble concentrating 0 0  Moving slowly or fidgety/restless 1 0  Suicidal thoughts 0 0  PHQ-9 Score 11 8    Results for orders placed or performed in visit on 07/20/18  POCT glucose (manual entry)  Result Value Ref Range   POC Glucose 127 (A) 70 - 99 mg/dl   Lab Results  Component Value Date   HGBA1C 7.8 (H) 04/27/2018   Lab Results  Component Value Date   WBC 4.5 07/09/2018   HGB 14.6 07/09/2018   HCT 47.4 (H) 07/09/2018   MCV 84 07/09/2018   PLT 242 07/09/2018     Chemistry      Component Value Date/Time   NA 145 (H) 07/09/2018 0937   K 3.8 07/09/2018 0937   CL 105 07/09/2018 0937   CO2 21 07/09/2018 0937   BUN 22 07/09/2018 0937   CREATININE 1.08 (H) 07/09/2018 0937      Component Value Date/Time   CALCIUM 9.7 07/09/2018 0937   ALKPHOS 91 04/27/2018 1613   AST 55 (H) 04/27/2018 1613   ALT 59 (H) 04/27/2018 1613   BILITOT 1.0 04/27/2018 1613       ASSESSMENT AND PLAN:  1. Type 2 diabetes mellitus without  complication, without long-term current use of insulin (HCC) A1c not at goal and morning blood sugars slightly above goal Discussed the importance of healthy eating habits, regular exercise and medication compliance to achieve or maintain control of diabetes.  Advised to avoid skipping meals so that she does not overeat at the next meal.  Advised to avoid sugary drinks, cut back on portion sizes and limit white carbohydrates - POCT glucose (manual entry) - Ambulatory referral to Ophthalmology  2. CAD in native artery Continue current guideline directed medications.  We will see if we can get the Brilinta through our pharmacy cheaper or through patient assistance program until she has insurance - ticagrelor (BRILINTA) 90 MG TABS tablet; Take 1 tablet (90 mg total) by mouth 2 (two) times daily.  Dispense: 60 tablet; Refill: 4  3. Essential hypertension Not at goal.  Increase Cozaar to 50 mg.  Continue to limit salt.  After she has been on the increased dose for at least 1 week, she will return to the lab for CMP - Comprehensive metabolic panel; Future - losartan (COZAAR) 50 MG tablet; Take 1 tablet (50 mg total) by mouth daily.  Dispense: 30 tablet; Refill: 6  4. Positive depression screening Patient reports that this is not a major issue for her at this time.  5. Chronic combined systolic and diastolic CHF (congestive heart failure) ([place]) Continue guideline directed medications.  Encourage compliance with medications.  6. Need for Tdap vaccination Given  7. Need for vaccination against Streptococcus pneumoniae Given  8. Influenza vaccination declined  9. Breast cancer screening - MM Digital Screening; Future  10. Abnormal LFTs - Comprehensive metabolic panel; Future  11. Obesity (BMI 35.0-39.9 without comorbidity) See #1 above. Advised patient to call the cardiac nurse back to arrange for cardiac rehab.  This will help in getting her to move more with safe  limitations    Patient was given the opportunity to ask questions.  Patient verbalized understanding of the plan and was able to repeat key elements of the plan.   Orders Placed This Encounter  Procedures  . POCT glucose (manual entry)     Requested Prescriptions   Signed Prescriptions Disp Refills  . Tdap (BOOSTRIX) 5-2.5-18.5 LF-MCG/0.5 injection 0.5 mL 0    Sig: Inject 0.5 mLs into the muscle as directed.    No follow-ups on file.  Karle Plumber, MD, FACP

## 2018-07-20 NOTE — Patient Instructions (Addendum)
Your blood pressure is not at goal.  I recommend increasing the losartan to 50 mg daily.  After you have been on the increased dose for 1 week, please return to the lab to have blood test done as discussed today.  You have been referred for mammogram and for eye exam.  Please call the BCCCP (breast and cervical cancer control program) at [phone] to schedule diagnostic mammogram   Td Vaccine (Tetanus and Diphtheria): What You Need to Know 1. Why get vaccinated? Tetanus  and diphtheria are very serious diseases. They are rare in the [place] today, but people who do become infected often have severe complications. Td vaccine is used to protect adolescents and adults from both of these diseases. Both tetanus and diphtheria are infections caused by bacteria. Diphtheria spreads from person to person through coughing or sneezing. Tetanus-causing bacteria enter the body through cuts, scratches, or wounds. TETANUS (Lockjaw) causes painful muscle tightening and stiffness, usually all over the body.  It can lead to tightening of muscles in the head and neck so you can't open your mouth, swallow, or sometimes even breathe. Tetanus kills about 1 out of every 10 people who are infected even after receiving the best medical care. DIPHTHERIA can cause a thick coating to form in the back of the throat.  It can lead to breathing problems, paralysis, heart failure, and death. Before vaccines, as many as 200,000 cases of diphtheria and hundreds of cases of tetanus were reported in the [place] each year. Since vaccination began, reports of cases for both diseases have dropped by about 99%. 2. Td vaccine Td vaccine can protect adolescents and adults from tetanus and diphtheria. Td is usually given as a booster dose every 10 years but it can also be given earlier after a severe and dirty wound or burn. Another vaccine, called Tdap, which protects against pertussis in addition to tetanus and diphtheria,  is sometimes recommended instead of Td vaccine. Your doctor or the person giving you the vaccine can give you more information. Td may safely be given at the same time as other vaccines. 3. Some people should not get this vaccine  A person who has ever had a life-threatening allergic reaction after a previous dose of any tetanus or diphtheria containing vaccine, OR has a severe allergy to any part of this vaccine, should not get Td vaccine. Tell the person giving the vaccine about any severe allergies.  Talk to your doctor if you: ? had severe pain or swelling after any vaccine containing diphtheria or tetanus, ? ever had a condition called Guillain Barr Syndrome (GBS), ? aren't feeling well on the day the shot is scheduled. 4. Risks of a vaccine reaction With any medicine, including vaccines, there is a chance of side effects. These are usually mild and go away on their own. Serious reactions are also possible but are rare. Most people who get Td vaccine do not have any problems with it. Mild Problems following Td vaccine: (Did not interfere with activities)  Pain where the shot was given (about 8 people in 10)  Redness or swelling where the shot was given (about 1 person in 4)  Mild fever (rare)  Headache (about 1 person in 4)  Tiredness (about 1 person in 4) Moderate Problems following Td vaccine: (Interfered with activities, but did not require medical attention)  Fever over 102F (rare) Severe Problems following Td vaccine: (Unable to perform usual activities; required medical attention)  Swelling, severe pain, bleeding and/or  redness in the arm where the shot was given (rare). Problems that could happen after any vaccine:  People sometimes faint after a medical procedure, including vaccination. Sitting or lying down for about 15 minutes can help prevent fainting, and injuries caused by a fall. Tell your doctor if you feel dizzy, or have vision changes or ringing in the  ears.  Some people get severe pain in the shoulder and have difficulty moving the arm where a shot was given. This happens very rarely.  Any medication can cause a severe allergic reaction. Such reactions from a vaccine are very rare, estimated at fewer than 1 in a million doses, and would happen within a few minutes to a few hours after the vaccination. As with any medicine, there is a very remote chance of a vaccine causing a serious injury or death. The safety of vaccines is always being monitored. For more information, visit: http://www.aguilar.org/ 5. What if there is a serious reaction? What should I look for?  Look for anything that concerns you, such as signs of a severe allergic reaction, very high fever, or unusual behavior. Signs of a severe allergic reaction can include hives, swelling of the face and throat, difficulty breathing, a fast heartbeat, dizziness, and weakness. These would usually start a few minutes to a few hours after the vaccination. What should I do?  If you think it is a severe allergic reaction or other emergency that can't wait, call 9-1-1 or get the person to the nearest hospital. Otherwise, call your doctor.  Afterward, the reaction should be reported to the Vaccine Adverse Event Reporting System (VAERS). Your doctor might file this report, or you can do it yourself through the VAERS web site at www.vaers.SamedayNews.es, or by calling 870-278-5700. VAERS does not give medical advice. 6. The National Vaccine Injury Compensation Program The [employer] Vaccine Injury Compensation Program (VICP) is a federal program that was created to compensate people who may have been injured by certain vaccines. Persons who believe they may have been injured by a vaccine can learn about the program and about filing a claim by calling 304-258-1871 or visiting the [place] website at GoldCloset.com.ee. There is a time limit to file a claim for compensation. 7. How can I  learn more?  Ask your doctor. He or she can give you the vaccine package insert or suggest other sources of information.  Call your local or state health department.  Contact the Centers for Disease Control and Prevention (CDC): ? Call 413-257-2246 (1-800-CDC-INFO) ? Visit CDC's website at http://hunter.com/ Vaccine Information Statement Td Vaccine (10/30/15) This information is not intended to replace advice given to you by your health care provider. Make sure you discuss any questions you have with your health care provider. Document Released: 05/04/2006 Document Revised: 02/22/2018 Document Reviewed: 02/22/2018 Elsevier Interactive Patient Education  2019 Elsevier Inc.   Pneumococcal Polysaccharide Vaccine: What You Need to Know 1. Why get vaccinated? Vaccination can protect older adults (and some children and younger adults) from pneumococcal disease. Pneumococcal disease is caused by bacteria that can spread from person to person through close contact. It can cause ear infections, and it can also lead to more serious infections of the:  Lungs (pneumonia),  Blood (bacteremia), and  Covering of the brain and spinal cord (meningitis). Meningitis can cause deafness and brain damage, and it can be fatal. Anyone can get pneumococcal disease, but children under 32 years of age, people with certain medical conditions, adults over 34 years of age, and  cigarette smokers are at the highest risk. About 18,000 older adults die each year from pneumococcal disease in the [place]. Treatment of pneumococcal infections with penicillin and other drugs used to be more effective. But some strains of the disease have become resistant to these drugs. This makes prevention of the disease, through vaccination, even more important. 2. Pneumococcal polysaccharide vaccine (PPSV23) Pneumococcal polysaccharide vaccine (PPSV23) protects against 23 types of pneumococcal bacteria. It will not prevent all  pneumococcal disease. PPSV23 is recommended for:  All adults 65 years of age and older,  Anyone 2 through 63 years of age with certain long-term health problems,  Anyone 2 through 63 years of age with a weakened immune system,  Adults 46 through 63 years of age who smoke cigarettes or have asthma. Most people need only one dose of PPSV. A second dose is recommended for certain high-risk groups. People 44 and older should get a dose even if they have gotten one or more doses of the vaccine before they turned 65. Your healthcare provider can give you more information about these recommendations. Most healthy adults develop protection within 2 to 3 weeks of getting the shot. 3. Some people should not get this vaccine  Anyone who has had a life-threatening allergic reaction to PPSV should not get another dose.  Anyone who has a severe allergy to any component of PPSV should not receive it. Tell your provider if you have any severe allergies.  Anyone who is moderately or severely ill when the shot is scheduled may be asked to wait until they recover before getting the vaccine. Someone with a mild illness can usually be vaccinated.  Children less than 47 years of age should not receive this vaccine.  There is no evidence that PPSV is harmful to either a pregnant woman or to her fetus. However, as a precaution, women who need the vaccine should be vaccinated before becoming pregnant, if possible. 4. Risks of a vaccine reaction With any medicine, including vaccines, there is a chance of side effects. These are usually mild and go away on their own, but serious reactions are also possible. About half of people who get PPSV have mild side effects, such as redness or pain where the shot is given, which go away within about two days. Less than 1 out of 100 people develop a fever, muscle aches, or more severe local reactions. Problems that could happen after any vaccine:  People sometimes faint after  a medical procedure, including vaccination. Sitting or lying down for about 15 minutes can help prevent fainting, and injuries caused by a fall. Tell your doctor if you feel dizzy, or have vision changes or ringing in the ears.  Some people get severe pain in the shoulder and have difficulty moving the arm where a shot was given. This happens very rarely.  Any medication can cause a severe allergic reaction. Such reactions from a vaccine are very rare, estimated at about 1 in a million doses, and would happen within a few minutes to a few hours after the vaccination. As with any medicine, there is a very remote chance of a vaccine causing a serious injury or death. The safety of vaccines is always being monitored. For more information, visit: http://www.aguilar.org/ 5. What if there is a serious reaction? What should I look for? Look for anything that concerns you, such as signs of a severe allergic reaction, very high fever, or unusual behavior. Signs of a severe allergic reaction can include hives,  swelling of the face and throat, difficulty breathing, a fast heartbeat, dizziness, and weakness. These would usually start a few minutes to a few hours after the vaccination. What should I do? If you think it is a severe allergic reaction or other emergency that can't wait, call 9-1-1 or get to the nearest hospital. Otherwise, call your doctor. Afterward, the reaction should be reported to the Vaccine Adverse Event Reporting System (VAERS). Your doctor might file this report, or you can do it yourself through the VAERS web site at www.vaers.SamedayNews.es, or by calling 785-207-0008. VAERS does not give medical advice. 6. How can I learn more?  Ask your doctor. He or she can give you the vaccine package insert or suggest other sources of information.  Call your local or state health department.  Contact the Centers for Disease Control and Prevention (CDC): ? Call (913)083-8973 (1-800-CDC-INFO)  or ? Visit CDC's website at http://hunter.com/ CDC Vaccine Information Statement PPSV Vaccine (11/11/2013) This information is not intended to replace advice given to you by your health care provider. Make sure you discuss any questions you have with your health care provider. Document Released: 05/04/2006 Document Revised: 02/16/2018 Document Reviewed: 02/16/2018 Elsevier Interactive Patient Education  2019 [employer].

## 2018-07-22 MED FILL — glipiZIDE 5 MG TABS: 5 | 15 days supply | Qty: 30 | Fill #0

## 2018-07-23 MED FILL — BRILINTA 90 MG TABLET: 90 | 30 days supply | Qty: 60 | Fill #0

## 2018-07-26 ENCOUNTER — Encounter (HOSPITAL_COMMUNITY)
Admission: RE | Admit: 2018-07-26 | Discharge: 2018-07-26 | Disposition: A | Discharge status: Home / Self Care | Payer: Self-pay | Source: Ambulatory Visit | Attending: Internal Medicine | Admitting: Internal Medicine

## 2018-07-26 DIAGNOSIS — Z955 Presence of coronary angioplasty implant and graft: Secondary | ICD-10-CM | POA: Insufficient documentation

## 2018-07-27 ENCOUNTER — Ambulatory Visit: Payer: Self-pay | Attending: Family Medicine

## 2018-07-27 DIAGNOSIS — R7989 Other specified abnormal findings of blood chemistry: Secondary | ICD-10-CM

## 2018-07-27 DIAGNOSIS — I10 Essential (primary) hypertension: Secondary | ICD-10-CM

## 2018-07-27 DIAGNOSIS — R945 Abnormal results of liver function studies: Secondary | ICD-10-CM

## 2018-07-27 NOTE — Progress Notes (Signed)
Patient here for lab visit only 

## 2018-07-28 ENCOUNTER — Encounter (HOSPITAL_COMMUNITY)
Admission: RE | Admit: 2018-07-28 | Discharge: 2018-07-28 | Disposition: A | Discharge status: Home / Self Care | Payer: Self-pay | Source: Ambulatory Visit | Attending: Internal Medicine | Admitting: Internal Medicine

## 2018-07-28 LAB — COMPREHENSIVE METABOLIC PANEL
ALT: 33 IU/L — ABNORMAL HIGH (ref 0–32)
AST: 33 IU/L (ref 0–40)
Albumin/Globulin Ratio: 1.3 (ref 1.2–2.2)
Albumin: 3.9 g/dL (ref 3.6–4.8)
Alkaline Phosphatase: 128 IU/L — ABNORMAL HIGH (ref 39–117)
BUN / CREAT RATIO: 19 (ref 12–28)
BUN: 20 mg/dL (ref 8–27)
Bilirubin Total: 0.5 mg/dL (ref 0.0–1.2)
CO2: 22 mmol/L (ref 20–29)
Calcium: 9.5 mg/dL (ref 8.7–10.3)
Chloride: 102 mmol/L (ref 96–106)
Creatinine, Ser: 1.06 mg/dL — ABNORMAL HIGH (ref 0.57–1.00)
GFR calc Af Amer: 65 mL/min/{1.73_m2} (ref >59)
GFR calc non Af Amer: 56 mL/min/{1.73_m2} — ABNORMAL LOW (ref >59)
Globulin, Total: 3 g/dL (ref 1.5–4.5)
Glucose: 141 mg/dL — ABNORMAL HIGH (ref 65–99)
Potassium: 4.3 mmol/L (ref 3.5–5.2)
Sodium: 142 mmol/L (ref 134–144)
Total Protein: 6.9 g/dL (ref 6.0–8.5)

## 2018-07-30 ENCOUNTER — Encounter (HOSPITAL_COMMUNITY)
Admission: RE | Admit: 2018-07-30 | Discharge: 2018-07-30 | Disposition: A | Discharge status: Home / Self Care | Payer: Self-pay | Source: Ambulatory Visit | Attending: Internal Medicine | Admitting: Internal Medicine

## 2018-08-02 ENCOUNTER — Encounter (HOSPITAL_COMMUNITY)
Admission: RE | Admit: 2018-08-02 | Discharge: 2018-08-02 | Disposition: A | Discharge status: Home / Self Care | Payer: Self-pay | Source: Ambulatory Visit | Attending: Internal Medicine | Admitting: Internal Medicine

## 2018-08-04 ENCOUNTER — Encounter (HOSPITAL_COMMUNITY): Payer: Self-pay

## 2018-08-05 MED FILL — glipiZIDE 5 MG TABS: 5 | 15 days supply | Qty: 30 | Fill #1

## 2018-08-05 MED FILL — FUROSEMIDE 40 MG TAB: 40 | 30 days supply | Qty: 60 | Fill #2

## 2018-08-06 ENCOUNTER — Encounter (HOSPITAL_COMMUNITY)
Admission: RE | Admit: 2018-08-06 | Discharge: 2018-08-06 | Disposition: A | Discharge status: Home / Self Care | Payer: Self-pay | Source: Ambulatory Visit | Attending: Internal Medicine | Admitting: Internal Medicine

## 2018-08-09 ENCOUNTER — Encounter (HOSPITAL_COMMUNITY)
Admission: RE | Admit: 2018-08-09 | Discharge: 2018-08-09 | Disposition: A | Discharge status: Home / Self Care | Payer: Self-pay | Source: Ambulatory Visit | Attending: Internal Medicine | Admitting: Internal Medicine

## 2018-08-09 ENCOUNTER — Telehealth: Payer: Self-pay

## 2018-08-09 NOTE — Telephone Encounter (Signed)
Contacted pt to go over lab results pt is aware and doesn't have any questions or concerns 

## 2018-08-11 ENCOUNTER — Encounter (HOSPITAL_COMMUNITY): Payer: Self-pay

## 2018-08-13 ENCOUNTER — Encounter (HOSPITAL_COMMUNITY): Payer: Self-pay

## 2018-08-16 ENCOUNTER — Encounter (HOSPITAL_COMMUNITY)
Admission: RE | Admit: 2018-08-16 | Discharge: 2018-08-16 | Disposition: A | Discharge status: Home / Self Care | Payer: Self-pay | Source: Ambulatory Visit | Attending: Internal Medicine | Admitting: Internal Medicine

## 2018-08-18 ENCOUNTER — Encounter (HOSPITAL_COMMUNITY)
Admission: RE | Admit: 2018-08-18 | Discharge: 2018-08-18 | Disposition: A | Discharge status: Home / Self Care | Payer: Self-pay | Source: Ambulatory Visit | Attending: Internal Medicine | Admitting: Internal Medicine

## 2018-08-20 ENCOUNTER — Encounter (HOSPITAL_COMMUNITY)
Admission: RE | Admit: 2018-08-20 | Discharge: 2018-08-20 | Disposition: A | Discharge status: Home / Self Care | Payer: Self-pay | Source: Ambulatory Visit | Attending: Internal Medicine | Admitting: Internal Medicine

## 2018-08-23 ENCOUNTER — Encounter (HOSPITAL_COMMUNITY): Payer: Self-pay

## 2018-08-23 DIAGNOSIS — Z955 Presence of coronary angioplasty implant and graft: Secondary | ICD-10-CM | POA: Insufficient documentation

## 2018-08-24 MED FILL — glipiZIDE 5 MG TABS: 5 | 15 days supply | Qty: 30 | Fill #2

## 2018-08-25 ENCOUNTER — Encounter (HOSPITAL_COMMUNITY): Payer: Self-pay

## 2018-08-26 ENCOUNTER — Ambulatory Visit: Payer: Self-pay | Admitting: Internal Medicine

## 2018-08-27 ENCOUNTER — Encounter (HOSPITAL_COMMUNITY): Payer: Self-pay

## 2018-08-30 ENCOUNTER — Encounter (HOSPITAL_COMMUNITY): Payer: Self-pay

## 2018-09-01 ENCOUNTER — Encounter (HOSPITAL_COMMUNITY): Payer: Self-pay

## 2018-09-02 MED FILL — glipiZIDE 5 MG TABS: 5 | 15 days supply | Qty: 30 | Fill #3

## 2018-09-02 MED FILL — FUROSEMIDE 40 MG TAB: 40 | 30 days supply | Qty: 60 | Fill #3

## 2018-09-03 ENCOUNTER — Encounter (HOSPITAL_COMMUNITY): Payer: Self-pay

## 2018-09-06 ENCOUNTER — Encounter (HOSPITAL_COMMUNITY)
Admission: RE | Admit: 2018-09-06 | Discharge: 2018-09-06 | Disposition: A | Discharge status: Home / Self Care | Payer: Self-pay | Source: Ambulatory Visit | Attending: Internal Medicine | Admitting: Internal Medicine

## 2018-09-08 ENCOUNTER — Encounter (HOSPITAL_COMMUNITY)
Admission: RE | Admit: 2018-09-08 | Discharge: 2018-09-08 | Disposition: A | Discharge status: Home / Self Care | Payer: Self-pay | Source: Ambulatory Visit | Attending: Internal Medicine | Admitting: Internal Medicine

## 2018-09-10 ENCOUNTER — Encounter (HOSPITAL_COMMUNITY): Payer: Self-pay

## 2018-09-13 ENCOUNTER — Encounter (HOSPITAL_COMMUNITY): Payer: Self-pay

## 2018-09-15 ENCOUNTER — Encounter (HOSPITAL_COMMUNITY)
Admission: RE | Admit: 2018-09-15 | Discharge: 2018-09-15 | Disposition: A | Discharge status: Home / Self Care | Payer: Self-pay | Source: Ambulatory Visit | Attending: Internal Medicine | Admitting: Internal Medicine

## 2018-09-15 MED FILL — glipiZIDE 5 MG TABS: 5 | 30 days supply | Qty: 60 | Fill #4

## 2018-09-15 MED FILL — METOPROLOL SUCCINATE ER 50: 50 | 30 days supply | Qty: 30 | Fill #0

## 2018-09-15 MED FILL — LOSARTAN POTASSIUM 50 MG TA: 50 | 30 days supply | Qty: 30 | Fill #1

## 2018-09-17 ENCOUNTER — Encounter (HOSPITAL_COMMUNITY)
Admission: RE | Admit: 2018-09-17 | Discharge: 2018-09-17 | Disposition: A | Discharge status: Home / Self Care | Payer: Self-pay | Source: Ambulatory Visit | Attending: Internal Medicine | Admitting: Internal Medicine

## 2018-09-20 ENCOUNTER — Encounter (HOSPITAL_COMMUNITY): Payer: Self-pay

## 2018-09-22 ENCOUNTER — Encounter (HOSPITAL_COMMUNITY): Payer: Self-pay

## 2018-09-24 ENCOUNTER — Encounter (HOSPITAL_COMMUNITY): Payer: Self-pay

## 2018-09-27 ENCOUNTER — Encounter (HOSPITAL_COMMUNITY): Payer: Self-pay

## 2018-09-28 ENCOUNTER — Ambulatory Visit: Payer: Self-pay | Admitting: Internal Medicine

## 2018-09-29 ENCOUNTER — Encounter (HOSPITAL_COMMUNITY): Payer: Self-pay

## 2018-10-01 ENCOUNTER — Encounter (HOSPITAL_COMMUNITY): Payer: Self-pay

## 2018-10-04 ENCOUNTER — Other Ambulatory Visit: Payer: Self-pay | Admitting: Physician Assistant

## 2018-10-04 ENCOUNTER — Encounter (HOSPITAL_COMMUNITY): Payer: Self-pay

## 2018-10-04 MED FILL — TRUE METRIX TEST STRIP: 25 days supply | Qty: 100 | Fill #1

## 2018-10-04 MED FILL — FUROSEMIDE 40 MG TAB: 40 | 30 days supply | Qty: 60 | Fill #4

## 2018-10-04 MED FILL — METOPROLOL SUCCINATE ER 50: 50 | 30 days supply | Qty: 30 | Fill #1

## 2018-10-05 MED FILL — ATORVASTATIN CALCIUM 40 MG: 40 | 30 days supply | Qty: 30 | Fill #1

## 2018-10-06 ENCOUNTER — Encounter (HOSPITAL_COMMUNITY): Payer: Self-pay

## 2018-10-08 ENCOUNTER — Encounter (HOSPITAL_COMMUNITY): Payer: Self-pay

## 2018-10-11 ENCOUNTER — Encounter (HOSPITAL_COMMUNITY): Payer: Self-pay

## 2018-10-13 ENCOUNTER — Encounter (HOSPITAL_COMMUNITY): Payer: Self-pay

## 2018-10-14 MED FILL — POTASSIUM CHLORIDE ER 10 ME: 10 | 30 days supply | Qty: 60 | Fill #1

## 2018-10-15 ENCOUNTER — Encounter (HOSPITAL_COMMUNITY): Payer: Self-pay

## 2018-10-18 ENCOUNTER — Encounter (HOSPITAL_COMMUNITY): Payer: Self-pay

## 2018-10-20 ENCOUNTER — Encounter (HOSPITAL_COMMUNITY): Payer: Self-pay

## 2018-10-22 ENCOUNTER — Encounter (HOSPITAL_COMMUNITY): Payer: Self-pay

## 2018-10-22 MED FILL — glipiZIDE 5 MG TABS: 5 | 30 days supply | Qty: 60 | Fill #5

## 2018-10-22 MED FILL — LOSARTAN POTASSIUM 50 MG TA: 50 | 30 days supply | Qty: 30 | Fill #2

## 2018-10-25 ENCOUNTER — Encounter (HOSPITAL_COMMUNITY): Payer: Self-pay

## 2018-10-27 ENCOUNTER — Encounter (HOSPITAL_COMMUNITY): Payer: Self-pay

## 2018-10-29 ENCOUNTER — Encounter (HOSPITAL_COMMUNITY): Payer: Self-pay

## 2018-11-01 ENCOUNTER — Encounter (HOSPITAL_COMMUNITY): Payer: Self-pay

## 2018-11-03 ENCOUNTER — Encounter (HOSPITAL_COMMUNITY): Payer: Self-pay

## 2018-11-03 MED FILL — ATORVASTATIN CALCIUM 40 MG: 40 | 30 days supply | Qty: 30 | Fill #2

## 2018-11-03 MED FILL — FUROSEMIDE 40 MG TAB: 40 | 30 days supply | Qty: 60 | Fill #5

## 2018-11-05 ENCOUNTER — Encounter (HOSPITAL_COMMUNITY): Payer: Self-pay

## 2018-11-08 ENCOUNTER — Encounter (HOSPITAL_COMMUNITY): Payer: Self-pay

## 2018-11-10 ENCOUNTER — Encounter (HOSPITAL_COMMUNITY): Payer: Self-pay

## 2018-11-12 ENCOUNTER — Encounter (HOSPITAL_COMMUNITY): Payer: Self-pay

## 2018-11-12 MED FILL — METOPROLOL SUCCINATE ER 50: 50 | 30 days supply | Qty: 30 | Fill #2

## 2018-11-12 MED FILL — POTASSIUM CHLORIDE ER 10 ME: 10 | 30 days supply | Qty: 60 | Fill #2

## 2018-11-15 ENCOUNTER — Encounter (HOSPITAL_COMMUNITY): Payer: Self-pay

## 2018-11-17 ENCOUNTER — Encounter (HOSPITAL_COMMUNITY): Payer: Self-pay

## 2018-11-18 ENCOUNTER — Ambulatory Visit: Payer: Self-pay | Admitting: Internal Medicine

## 2018-11-19 ENCOUNTER — Encounter (HOSPITAL_COMMUNITY): Payer: Self-pay

## 2018-11-22 ENCOUNTER — Encounter (HOSPITAL_COMMUNITY): Payer: Self-pay

## 2018-11-24 ENCOUNTER — Encounter (HOSPITAL_COMMUNITY): Payer: Self-pay

## 2018-11-26 ENCOUNTER — Encounter (HOSPITAL_COMMUNITY): Payer: Self-pay

## 2018-11-29 ENCOUNTER — Encounter (HOSPITAL_COMMUNITY): Payer: Self-pay

## 2018-12-01 ENCOUNTER — Encounter (HOSPITAL_COMMUNITY): Payer: Self-pay

## 2018-12-03 ENCOUNTER — Encounter (HOSPITAL_COMMUNITY): Payer: Self-pay

## 2018-12-06 ENCOUNTER — Encounter (HOSPITAL_COMMUNITY): Payer: Self-pay

## 2018-12-08 ENCOUNTER — Encounter (HOSPITAL_COMMUNITY): Payer: Self-pay

## 2018-12-08 MED FILL — FUROSEMIDE 40 MG TAB: 40 | 30 days supply | Qty: 60 | Fill #6

## 2018-12-08 MED FILL — glipiZIDE 5 MG TABS: 5 | 30 days supply | Qty: 60 | Fill #6

## 2018-12-08 MED FILL — ATORVASTATIN CALCIUM 40 MG: 40 | 30 days supply | Qty: 30 | Fill #3

## 2018-12-08 MED FILL — METOPROLOL SUCCINATE ER 50: 50 | 30 days supply | Qty: 30 | Fill #3

## 2018-12-10 ENCOUNTER — Encounter (HOSPITAL_COMMUNITY): Payer: Self-pay

## 2018-12-15 ENCOUNTER — Encounter (HOSPITAL_COMMUNITY): Payer: Self-pay

## 2018-12-15 MED FILL — POTASSIUM CHLORIDE ER 10 ME: 10 | 30 days supply | Qty: 60 | Fill #3

## 2018-12-17 ENCOUNTER — Encounter (HOSPITAL_COMMUNITY): Payer: Self-pay

## 2018-12-20 ENCOUNTER — Encounter (HOSPITAL_COMMUNITY): Payer: Self-pay

## 2018-12-22 ENCOUNTER — Encounter (HOSPITAL_COMMUNITY): Payer: Self-pay

## 2018-12-24 ENCOUNTER — Encounter (HOSPITAL_COMMUNITY): Payer: Self-pay

## 2018-12-27 ENCOUNTER — Encounter (HOSPITAL_COMMUNITY): Payer: Self-pay

## 2018-12-28 ENCOUNTER — Telehealth (HOSPITAL_COMMUNITY): Payer: Self-pay

## 2018-12-28 NOTE — Telephone Encounter (Signed)

## 2018-12-29 ENCOUNTER — Other Ambulatory Visit (HOSPITAL_COMMUNITY): Payer: Self-pay

## 2018-12-29 ENCOUNTER — Other Ambulatory Visit: Payer: Self-pay

## 2018-12-29 ENCOUNTER — Ambulatory Visit (HOSPITAL_COMMUNITY): Payer: BC Managed Care – PPO | Attending: Cardiology

## 2018-12-29 ENCOUNTER — Encounter (HOSPITAL_COMMUNITY): Payer: Self-pay

## 2018-12-29 DIAGNOSIS — I255 Ischemic cardiomyopathy: Secondary | ICD-10-CM | POA: Diagnosis not present

## 2018-12-30 ENCOUNTER — Telehealth: Payer: Self-pay | Admitting: Internal Medicine

## 2018-12-30 NOTE — Telephone Encounter (Signed)
smartphone/ consent/ my chart via emailed/ pre reg completed  °

## 2018-12-31 ENCOUNTER — Encounter (HOSPITAL_COMMUNITY): Payer: Self-pay

## 2019-01-03 ENCOUNTER — Encounter (HOSPITAL_COMMUNITY): Payer: Self-pay

## 2019-01-04 ENCOUNTER — Telehealth: Payer: Self-pay | Admitting: Internal Medicine

## 2019-01-04 ENCOUNTER — Telehealth (INDEPENDENT_AMBULATORY_CARE_PROVIDER_SITE_OTHER): Payer: BC Managed Care – PPO | Admitting: Internal Medicine

## 2019-01-04 ENCOUNTER — Encounter: Payer: Self-pay | Admitting: Internal Medicine

## 2019-01-04 VITALS — Ht 68.0 in | Wt 270.0 lb

## 2019-01-04 DIAGNOSIS — Z955 Presence of coronary angioplasty implant and graft: Secondary | ICD-10-CM

## 2019-01-04 DIAGNOSIS — I110 Hypertensive heart disease with heart failure: Secondary | ICD-10-CM

## 2019-01-04 DIAGNOSIS — E109 Type 1 diabetes mellitus without complications: Secondary | ICD-10-CM

## 2019-01-04 DIAGNOSIS — I5042 Chronic combined systolic (congestive) and diastolic (congestive) heart failure: Secondary | ICD-10-CM

## 2019-01-04 DIAGNOSIS — I10 Essential (primary) hypertension: Secondary | ICD-10-CM

## 2019-01-04 DIAGNOSIS — I5023 Acute on chronic systolic (congestive) heart failure: Secondary | ICD-10-CM

## 2019-01-04 DIAGNOSIS — I255 Ischemic cardiomyopathy: Secondary | ICD-10-CM

## 2019-01-04 MED ORDER — SACUBITRIL-VALSARTAN 49-51 MG PO TABS: 1.0000 | ORAL_TABLET | Freq: Two times a day (BID) | ORAL | 11 refills | Status: DC

## 2019-01-04 MED FILL — ENTRESTO 49 MG-51 MG TABLET: 49-51 | 30 days supply | Qty: 60 | Fill #0

## 2019-01-04 NOTE — Telephone Encounter (Signed)
Patient called to review e-visit instructions. Patient aware that the following changes have been made: stop losartan, start entresto  Patient aware that they will need the following labs: none Patient aware that they will need the following test(s): limited echo in 6 months, prior to next visit  Recall for 6 months entered.   No further assistance needed at this time.

## 2019-01-04 NOTE — Progress Notes (Signed)
Virtual Visit via Video Note   This visit type was conducted due to national recommendations for restrictions regarding the COVID-19 Pandemic (e.g. social distancing) in an effort to limit this patient's exposure and mitigate transmission in our community.  Due to her co-morbid illnesses, this patient is at least at moderate risk for complications without adequate follow up.  This format is felt to be most appropriate for this patient at this time.  All issues noted in this document were discussed and addressed.  A limited physical exam was performed with this format.  Please refer to the patient's chart for her consent to telehealth for [facility].   Evaluation Performed:  Doximity video visit  Date:  01/04/2019   ID:  Tracey Gallegos, DOB 1956/02/17, MRN [id number]  Patient Location:  [place] [address]  Provider location:   [address], [place], [place] [address]  PCP:  Ladell Pier, MD  Cardiologist:  Pixie Casino, MD Electrophysiologist:  None   Chief Complaint:  No complaints  History of Present Illness:    Tracey Gallegos is a 63 y.o. female who presents via audio/video conferencing for a telehealth visit today.  Tracey Gallegos was seen today for routine follow-up.  Last October she had an echocardiogram which showed an LVEF of 25%.  Subsequently in December she underwent PCI to severe single-vessel bifurcation disease of the circumflex and first marginal artery with Xience drug-eluting stents.  She had some residual moderate 55 stenosis and a proximal ramus intermedius.  Otherwise LVEF was somewhat out of proportion to the degree of coronary disease she had.  Despite this, she did have improvement in LV function with a repeat echo 04/2019.  Her LVEF is now 45% although there is moderate diastolic dysfunction.  I discussed those findings with her and she reports improvement in her symptoms.  She now only gets shortness of breath with marked exertion  more consistent with NYHA class II symptoms.  I reviewed her medications and felt that we could further optimize those for component of nonischemic cardiomyopathy.  The patient does not have symptoms concerning for COVID-19 infection (fever, chills, cough, or new SHORTNESS OF BREATH).    Prior CV studies:   The following studies were reviewed today:  Chart review Echo  PMHx:  Past Medical History:  Diagnosis Date   CHF (congestive heart failure) ([place])    Coronary artery disease    Diabetes mellitus without complication ([place])    Dyspnea    Hypertension     Past Surgical History:  Procedure Laterality Date   CARDIAC CATHETERIZATION  06/28/2018   CORONARY STENT INTERVENTION N/A 06/28/2018   Procedure: CORONARY STENT INTERVENTION;  Surgeon: Leonie Man, MD;  Location: [place] [facility];  Service: Cardiovascular;  Laterality: N/A;   CORONARY STENT PLACEMENT  06/28/2018   RIGHT/LEFT HEART CATH AND CORONARY ANGIOGRAPHY N/A 06/28/2018   Procedure: RIGHT/LEFT HEART CATH AND CORONARY ANGIOGRAPHY;  Surgeon: Leonie Man, MD;  Location: [place] [facility];  Service: Cardiovascular;  Laterality: N/A;   TOTAL KNEE ARTHROPLASTY Left 05/2016    FAMHx:  Family History  Problem Relation Age of Onset   Diabetes Mother     SOCHx:   reports that she has never smoked. She has never used smokeless tobacco. She reports current alcohol use. She reports that she does not use drugs.  ALLERGIES:  Allergies  Allergen Reactions   Lisinopril Cough    MEDS:  Current Meds  Medication Sig   aspirin (ASPIRIN 81) 81 MG EC tablet Take 1 tablet (81 mg total) by mouth daily.   atorvastatin (LIPITOR) 40 MG tablet Take 1 tablet (40 mg total) by mouth daily at 6 PM.   Blood Glucose Monitoring Suppl (TRUE METRIX METER) DEVI 1 kit by Does not apply route 4 (four) times daily.   furosemide (LASIX) 40 MG tablet Take 1 tablet (40 mg total) by mouth 2 (two) times daily.   glipiZIDE  (GLUCOTROL) 5 MG tablet Take 1 tablet (5 mg total) by mouth 2 (two) times daily before a meal.   glucose blood (TRUE METRIX BLOOD GLUCOSE TEST) test strip Use as instructed   metoprolol succinate (TOPROL-XL) 50 MG 24 hr tablet Take 1 tablet (50 mg total) by mouth daily. Take with or immediately following a meal.   Omega-3 Fatty Acids (FISH OIL PO) Take 1,000 mg by mouth daily.    potassium chloride (K-DUR) 10 MEQ tablet Take 1 tablet (10 mEq total) by mouth 2 (two) times daily.   ticagrelor (BRILINTA) 90 MG TABS tablet Take 1 tablet (90 mg total) by mouth 2 (two) times daily.   TRUEPLUS LANCETS 28G MISC 28 g by Does not apply route 4 (four) times daily.   [DISCONTINUED] losartan (COZAAR) 50 MG tablet Take 1 tablet (50 mg total) by mouth daily.     ROS: Pertinent items noted in HPI and remainder of comprehensive ROS otherwise negative.  Labs/Other Tests and Data Reviewed:    Recent Labs: 04/27/2018: TSH 1.927 06/24/2018: B Natriuretic Peptide 1,177.4 06/28/2018: Magnesium 2.0 07/09/2018: Hemoglobin 14.6; Platelets 242 07/27/2018: ALT 33; BUN 20; Creatinine, Ser 1.06; Potassium 4.3; Sodium 142   Recent Lipid Panel Lab Results  Component Value Date/Time   CHOL 202 (H) 04/27/2018 03:13 PM   TRIG 84 04/27/2018 03:13 PM   HDL 37 (L) 04/27/2018 03:13 PM   CHOLHDL 5.5 04/27/2018 03:13 PM   LDLCALC 148 (H) 04/27/2018 03:13 PM    Wt Readings from Last 3 Encounters:  01/04/19 270 lb (122.5 kg)  07/20/18 259 lb 9.6 oz (117.8 kg)  07/09/18 253 lb 12.8 oz (115.1 kg)     Exam:    Vital Signs:  Ht 5' 8" (1.727 m)    Wt 270 lb (122.5 kg)    BMI 41.05 kg/m    General appearance: alert, no distress and morbidly obese Lungs: No visual respiratory difficulty Abdomen: Morbidly obese Extremities: extremities normal, atraumatic, no cyanosis or edema Skin: Skin color, texture, turgor normal. No rashes or lesions Neurologic: Mental status: Alert, oriented, thought content appropriate Psych:  Pleasant  ASSESSMENT & PLAN:    1. Coronary artery disease status post DES x2 to the first marginal and circumflex arteries (06/2018) 2. Acute on chronic systolic congestive heart failure, LVEF 25 to 30% (04/2018) -improved to 45% by echo 12/29/18 3. NYHA class II symptoms 4. Morbid obesity 5. Poorly controlled hypertension 6. History of medication noncompliance  Ms. Blayney is had improvement in LV function based on her echo however her EF is not not yet normalized.  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  As she is already on losartan and metoprolol, I would recommend we switch her losartan over to Entresto 49/51 mg twice daily.  This could be associated with improvement in LV function as well as lowering her elevated LV filling pressures.  We will plan a repeat echo in 6 months (  limited for LVEF).  Follow-up with me at that time.  COVID-19 Education: The signs and symptoms of COVID-19 were discussed with the patient and how to seek care for testing (follow up with PCP or arrange E-visit).  The importance of social distancing was discussed today.  Patient Risk:   After full review of this patients clinical status, I feel that they are at least moderate risk at this time.  Time:   Today, I have spent 25 minutes with the patient with telehealth technology discussing chronic systolic congestive heart failure, coronary artery disease, hypertension, weight loss.     Medication Adjustments/Labs and Tests Ordered: Current medicines are reviewed at length with the patient today.  Concerns regarding medicines are outlined above.   Tests Ordered: Orders Placed This Encounter  Procedures   ECHOCARDIOGRAM LIMITED    Medication Changes: Meds ordered this encounter  Medications   sacubitril-valsartan (ENTRESTO) 49-51 MG    Sig: Take 1 tablet by mouth 2 (two) times daily.    Dispense:  60  tablet    Refill:  11    To replace losartan    Disposition:  in 6 month(s)  Pixie Casino, MD, [facility], [place] Director of the Advanced Lipid Disorders &  Cardiovascular Risk Reduction Clinic Diplomate of the American Board of Clinical Lipidology Attending Cardiologist  Direct Dial: [phone]   Fax: [phone]  Website:  [email or web address]  Pixie Casino, MD  01/04/2019 9:56 AM

## 2019-01-04 NOTE — Patient Instructions (Addendum)
Medication Instructions:  STOP losartan START entresto 49/51mg  twice daily  -- you can obtain a copay card for this medication to use until age 64 from [email or web address] >> patient support >> how to save If you need a refill on your cardiac medications before your next appointment, please call your pharmacy.   Testing/Procedures: Your physician has requested that you have a limited echocardiogram. Echocardiography is a painless test that uses sound waves to create images of your heart. It provides your doctor with information about the size and shape of your heart and how well your heart's chambers and valves are working. This procedure takes approximately one hour. There are no restrictions for this procedure. -- due December 2020 (prior to next visit)  Follow-Up: At [facility], you and your health needs are our priority.  As part of our continuing mission to provide you with exceptional heart care, we have created designated Provider Care Teams.  These Care Teams include your primary Cardiologist (physician) and Advanced Practice Providers (APPs -  Physician Assistants and Nurse Practitioners) who all work together to provide you with the care you need, when you need it. You will need a follow up appointment in 6 months.  Please call our office 2 months in advance to schedule this appointment.  You may see Pixie Casino, MD or one of the following Advanced Practice Providers on your designated Care Team: [place], [place] . Fabian Sharp, PA-C  Any Other Special Instructions Will Be Listed Below (If Applicable).

## 2019-01-05 ENCOUNTER — Encounter (HOSPITAL_COMMUNITY): Payer: Self-pay

## 2019-01-05 ENCOUNTER — Other Ambulatory Visit: Payer: Self-pay | Admitting: Pharmacist

## 2019-01-05 DIAGNOSIS — E119 Type 2 diabetes mellitus without complications: Secondary | ICD-10-CM

## 2019-01-05 MED ORDER — MICROLET LANCETS MISC: 11 refills | Status: DC

## 2019-01-05 MED ORDER — CONTOUR NEXT MONITOR W/DEVICE KIT: 1.0000 | PACK | Freq: Every day | 0 refills | Status: DC

## 2019-01-05 MED ORDER — CONTOUR NEXT TEST VI STRP: ORAL_STRIP | 11 refills | Status: DC

## 2019-01-05 MED FILL — CONTOUR NEXT METER: W/DEVICE | 30 days supply | Qty: 1 | Fill #0

## 2019-01-05 MED FILL — MICROLET LANCETS MISC: 30 days supply | Qty: 100 | Fill #0

## 2019-01-05 MED FILL — CONTOUR NEXT TEST STRP: 30 days supply | Qty: 100 | Fill #0

## 2019-01-06 MED FILL — ATORVASTATIN CALCIUM 40 MG: 40 | 30 days supply | Qty: 30 | Fill #4

## 2019-01-06 MED FILL — METOPROLOL SUCCINATE ER 50: 50 | 30 days supply | Qty: 30 | Fill #4

## 2019-01-06 MED FILL — glipiZIDE 5 MG TABS: 5 | 30 days supply | Qty: 60 | Fill #7

## 2019-01-06 MED FILL — FUROSEMIDE 40 MG TAB: 40 | 30 days supply | Qty: 60 | Fill #7

## 2019-01-07 ENCOUNTER — Encounter (HOSPITAL_COMMUNITY): Payer: Self-pay

## 2019-01-10 ENCOUNTER — Encounter (HOSPITAL_COMMUNITY): Payer: Self-pay

## 2019-01-12 ENCOUNTER — Encounter (HOSPITAL_COMMUNITY): Payer: Self-pay

## 2019-01-14 ENCOUNTER — Encounter (HOSPITAL_COMMUNITY): Payer: Self-pay

## 2019-01-17 ENCOUNTER — Other Ambulatory Visit: Payer: Self-pay

## 2019-01-17 ENCOUNTER — Other Ambulatory Visit (HOSPITAL_COMMUNITY)
Admission: RE | Admit: 2019-01-17 | Discharge: 2019-01-17 | Disposition: A | Discharge status: Home / Self Care | Payer: BC Managed Care – PPO | Source: Ambulatory Visit | Attending: Internal Medicine | Admitting: Internal Medicine

## 2019-01-17 ENCOUNTER — Encounter (HOSPITAL_COMMUNITY): Payer: Self-pay

## 2019-01-17 ENCOUNTER — Ambulatory Visit (HOSPITAL_BASED_OUTPATIENT_CLINIC_OR_DEPARTMENT_OTHER): Payer: BC Managed Care – PPO | Admitting: Internal Medicine

## 2019-01-17 ENCOUNTER — Encounter: Payer: Self-pay | Admitting: Internal Medicine

## 2019-01-17 VITALS — BP 123/72 | HR 68 | Temp 98.4°F | Resp 18 | Ht 68.0 in | Wt 264.0 lb

## 2019-01-17 DIAGNOSIS — I10 Essential (primary) hypertension: Secondary | ICD-10-CM

## 2019-01-17 DIAGNOSIS — Z124 Encounter for screening for malignant neoplasm of cervix: Secondary | ICD-10-CM | POA: Diagnosis not present

## 2019-01-17 DIAGNOSIS — Z0000 Encounter for general adult medical examination without abnormal findings: Secondary | ICD-10-CM | POA: Diagnosis not present

## 2019-01-17 DIAGNOSIS — E119 Type 2 diabetes mellitus without complications: Secondary | ICD-10-CM | POA: Diagnosis not present

## 2019-01-17 DIAGNOSIS — Z1211 Encounter for screening for malignant neoplasm of colon: Secondary | ICD-10-CM

## 2019-01-17 DIAGNOSIS — Z1159 Encounter for screening for other viral diseases: Secondary | ICD-10-CM

## 2019-01-17 DIAGNOSIS — Z1239 Encounter for other screening for malignant neoplasm of breast: Secondary | ICD-10-CM | POA: Diagnosis not present

## 2019-01-17 DIAGNOSIS — I5042 Chronic combined systolic (congestive) and diastolic (congestive) heart failure: Secondary | ICD-10-CM

## 2019-01-17 LAB — POCT GLYCOSYLATED HEMOGLOBIN (HGB A1C): Hemoglobin A1C: 6.6 % — AB (ref 4.0–5.6)

## 2019-01-17 MED FILL — POTASSIUM CHLORIDE ER 10 ME: 10 | 30 days supply | Qty: 60 | Fill #4

## 2019-01-17 NOTE — Progress Notes (Signed)
Patient ID: Tracey Gallegos, female    DOB: 01-18-1956  MRN: [id number]  CC: Annual health maintenance exam  Subjective: Tracey Gallegos is a 63 y.o. female who presents for annual exam. Her concerns today include:  Patient with history of HTN, DM type II, CAD with DES to LT CXM, Diastolic and systolic CHF with EF of 25 to 30%,moderate pulmonary hypertension on echo 04/2018, morbid obesity.  Patient was last seen by me 06/2018.  Last pap was 3 yrs ago.  She thinks she may have had an abnormal Pap in the past but no procedure was needed. Postmenopausal.  Denies any postmenopausal bleeding No vaginal dischg or itching Not sexually active.  Declines STI screening Paternal GM with breast CA  DM: Denies any blurred vision or numbness tingling in the hands or feet.   Checking BS once in mornings.  Gives range 110-130.   Med:  Compliant with Glucotrol Eating habits:  Better in the last few wks.  She was constantly snacking so she cut back on the amount she eats during the day Exercise:  Does walking in place for 30-45 minutes every morning.  Sometimes she also walks for 30 mins in the evening Overdue for diabetic eye exam.  No blurred vision at this time. Overdue for microalbumin urea.  CAD/CHF: Denies any chest pain, shortness of breath, PND orthopnea, lower extremity edema at this time.  Reports compliance with medications including aspirin, atorvastatin, metoprolol, Entresto, Brilinta.  Recently had virtual visit with cardiologist Dr. Roanna Epley.  HM:  Gives fhx of colon CA in brother at age 66.  Pt's last c-scope was about 5 yrs ago in [place].  States she was told she would need repeat in 5 years.  She thinks her 5 years will be in November of this year.  She is due for mammogram.    Medication list, family, social, surgical histories reviewed and updated Patient Active Problem List   Diagnosis Date Noted  . Influenza vaccination declined 07/20/2018  . Chronic combined systolic and diastolic CHF  (congestive heart failure) ([place]) 07/20/2018  . Positive depression screening 07/20/2018  . CAD (coronary artery disease) 07/09/2018  . Hypertensive heart disease with acute on chronic systolic congestive heart failure ([place]) 06/24/2018  . Type 2 diabetes mellitus without complication, without long-term current use of insulin ([place]) 06/24/2018  . Morbid obesity ([place])      Current Outpatient Medications on File Prior to Visit  Medication Sig Dispense Refill  . aspirin (ASPIRIN 81) 81 MG EC tablet Take 1 tablet (81 mg total) by mouth daily. 30 tablet 12  . atorvastatin (LIPITOR) 40 MG tablet Take 1 tablet (40 mg total) by mouth daily at 6 PM. 30 tablet 11  . Blood Glucose Monitoring Suppl (CONTOUR NEXT MONITOR) w/Device KIT 1 kit by Does not apply route daily. Use to check blood sugar daily. E11.9 1 kit 0  . furosemide (LASIX) 40 MG tablet Take 1 tablet (40 mg total) by mouth 2 (two) times daily. 60 tablet 11  . glipiZIDE (GLUCOTROL) 5 MG tablet Take 1 tablet (5 mg total) by mouth 2 (two) times daily before a meal. 30 tablet 11  . glucose blood (CONTOUR NEXT TEST) test strip Use as instructed to check blood sugar daily. E11.9 100 each 11  . metoprolol succinate (TOPROL-XL) 50 MG 24 hr tablet Take 1 tablet (50 mg total) by mouth daily. Take with or immediately following a meal. 30 tablet 11  . Microlet Lancets MISC Use to check  blood sugar daily. E11.9 100 each 11  . Omega-3 Fatty Acids (FISH OIL PO) Take 1,000 mg by mouth daily.     . potassium chloride (K-DUR) 10 MEQ tablet Take 1 tablet (10 mEq total) by mouth 2 (two) times daily. 60 tablet 11  . sacubitril-valsartan (ENTRESTO) 49-51 MG Take 1 tablet by mouth 2 (two) times daily. 60 tablet 11  . ticagrelor (BRILINTA) 90 MG TABS tablet Take 1 tablet (90 mg total) by mouth 2 (two) times daily. 60 tablet 4   No current facility-administered medications on file prior to visit.     Allergies  Allergen Reactions  . Lisinopril Cough    Social  History   Socioeconomic History  . Marital status: Single    Spouse name: Not on file  . Number of children: 0  . Years of education: Not on file  . Highest education level: Not on file  Occupational History  . Not on file  Social Needs  . Financial resource strain: Not on file  . Food insecurity    Worry: Not on file    Inability: Not on file  . Transportation needs    Medical: Not on file    Non-medical: Not on file  Tobacco Use  . Smoking status: Never Smoker  . Smokeless tobacco: Never Used  Substance and Sexual Activity  . Alcohol use: Yes    Comment: occasionally  . Drug use: Never  . Sexual activity: Not Currently  Lifestyle  . Physical activity    Days per week: Not on file    Minutes per session: Not on file  . Stress: Not on file  Relationships  . Social Herbalist on phone: Not on file    Gets together: Not on file    Attends religious service: Not on file    Active member of club or organization: Not on file    Attends meetings of clubs or organizations: Not on file    Relationship status: Not on file  . Intimate partner violence    Fear of current or ex partner: Not on file    Emotionally abused: Not on file    Physically abused: Not on file    Forced sexual activity: Not on file  Other Topics Concern  . Not on file  Social History Narrative  . Not on file    Family History  Problem Relation Age of Onset  . Diabetes Mother   . Colon cancer Brother     Past Surgical History:  Procedure Laterality Date  . CARDIAC CATHETERIZATION  06/28/2018  . CORONARY STENT INTERVENTION N/A 06/28/2018   Procedure: CORONARY STENT INTERVENTION;  Surgeon: Leonie Man, MD;  Location: [place] [facility];  Service: Cardiovascular;  Laterality: N/A;  . CORONARY STENT PLACEMENT  06/28/2018  . RIGHT/LEFT HEART CATH AND CORONARY ANGIOGRAPHY N/A 06/28/2018   Procedure: RIGHT/LEFT HEART CATH AND CORONARY ANGIOGRAPHY;  Surgeon: Leonie Man, MD;   Location: [place] [facility];  Service: Cardiovascular;  Laterality: N/A;  . TOTAL KNEE ARTHROPLASTY Left 05/2016    ROS: Review of Systems  Constitutional: Negative for activity change and fever.  HENT: Negative for hearing loss.   Respiratory: Negative for cough and shortness of breath.   Cardiovascular: Negative for chest pain and palpitations.  Gastrointestinal: Negative for blood in stool.       She is moving her bowels okay.  Genitourinary: Negative for difficulty urinating and hematuria.  Skin:  Some bruising at times which he attributes to the Brilinta   Negative except as stated above  PHYSICAL EXAM: BP 123/72 (BP Location: Left Arm, Patient Position: Sitting, Cuff Size: Large)   Pulse 68   Temp 98.4 F (36.9 C) (Oral)   Resp 18   Ht '5\' 8"'$  (1.727 m)   Wt 264 lb (119.7 kg)   SpO2 100%   BMI 40.14 kg/m   Wt Readings from Last 3 Encounters:  01/17/19 264 lb (119.7 kg)  01/04/19 270 lb (122.5 kg)  07/20/18 259 lb 9.6 oz (117.8 kg)    Physical Exam  General appearance - alert, well appearing, obese older African-American female and in no distress Mental status - normal mood, behavior, speech, dress, motor activity, and thought processes Eyes - pupils equal and reactive, extraocular eye movements intact Ears - bilateral TM's and external ear canals normal Nose - normal and patent, no erythema, discharge or polyps Mouth - mucous membranes moist, pharynx normal without lesions.  Wearing partials above and below Neck - supple, no significant adenopathy Lymphatics - no palpable lymphadenopathy, no hepatosplenomegaly Chest - clear to auscultation, no wheezes, rales or rhonchi, symmetric air entry Heart - normal rate, regular rhythm, normal S1, S2, no murmurs, rubs, clicks or gallops Abdomen -obese, soft, nontender, nondistended, no masses or organomegaly Breasts - breasts appear normal, no suspicious masses, no skin or nipple changes or axillary nodes Pelvic - CMA  [place] present:  normal external genitalia, vulva, vagina, cervix, uterus and adnexa.  All amount of white discharge in the vaginal vault. Musculoskeletal -patient ambulates independently.  She is noted to have a healed scar over the left knee extremities - peripheral pulses normal, no pedal edema, no clubbing or cyanosis Skin -mild varicosities in the lower legs and around the ankles Diabetic Foot Exam - Simple   Simple Foot Form Visual Inspection See comments: Yes Sensation Testing Intact to touch and monofilament testing bilaterally: Yes Pulse Check Posterior Tibialis and Dorsalis pulse intact bilaterally: Yes Comments Hammer toes through out.  Small callous over 2-5th toes     Depression screen [facility] 2/9 01/17/2019 06/24/2018 06/08/2018  Decreased Interest 0 2 0  Down, Depressed, Hopeless 0 1 1  PHQ - 2 Score 0 3 1  Altered sleeping '1 2 2  '$ Tired, decreased energy 0 3 3  Change in appetite 0 2 2  Feeling bad or failure about yourself  0 0 0  Trouble concentrating 0 0 0  Moving slowly or fidgety/restless 0 1 0  Suicidal thoughts 0 0 0  PHQ-9 Score '1 11 8    '$ Results for orders placed or performed in visit on 01/17/19  POCT A1C  Result Value Ref Range   Hemoglobin A1C 6.6 (A) 4.0 - 5.6 %   HbA1c POC (<> result, manual entry)     HbA1c, POC (prediabetic range)     HbA1c, POC (controlled diabetic range)       CMP Latest Ref Rng & Units 07/27/2018 07/09/2018 06/30/2018  Glucose 65 - 99 mg/dL 141(H) 144(H) 136(H)  BUN 8 - 27 mg/dL 20 22 26(H)  Creatinine 0.57 - 1.00 mg/dL 1.06(H) 1.08(H) 1.18(H)  Sodium 134 - 144 mmol/L 142 145(H) 140  Potassium 3.5 - 5.2 mmol/L 4.3 3.8 4.0  Chloride 96 - 106 mmol/L 102 105 103  CO2 20 - 29 mmol/L '22 21 27  '$ Calcium 8.7 - 10.3 mg/dL 9.5 9.7 9.5  Total Protein 6.0 - 8.5 g/dL 6.9 - -  Total Bilirubin 0.0 -  1.2 mg/dL 0.5 - -  Alkaline Phos 39 - 117 IU/L 128(H) - -  AST 0 - 40 IU/L 33 - -  ALT 0 - 32 IU/L 33(H) - -   Lipid Panel     Component  Value Date/Time   CHOL 202 (H) 04/27/2018 1513   TRIG 84 04/27/2018 1513   HDL 37 (L) 04/27/2018 1513   CHOLHDL 5.5 04/27/2018 1513   VLDL 17 04/27/2018 1513   LDLCALC 148 (H) 04/27/2018 1513    CBC    Component Value Date/Time   WBC 4.5 07/09/2018 0937   WBC 5.4 06/30/2018 0401   RBC 5.63 (H) 07/09/2018 0937   RBC 5.42 (H) 06/30/2018 0401   HGB 14.6 07/09/2018 0937   HCT 47.4 (H) 07/09/2018 0937   PLT 242 07/09/2018 0937   MCV 84 07/09/2018 0937   MCH 25.9 (L) 07/09/2018 0937   MCH 24.7 (L) 06/30/2018 0401   MCHC 30.8 (L) 07/09/2018 0937   MCHC 30.3 06/30/2018 0401   RDW 16.9 (H) 07/09/2018 0937   LYMPHSABS 1.7 07/09/2018 0937   MONOABS 0.5 06/24/2018 1305   EOSABS 0.6 (H) 07/09/2018 0937   BASOSABS 0.1 07/09/2018 0937    ASSESSMENT AND PLAN: 1. Visit for annual health examination 2. Pap smear for cervical cancer screening - Cytology - PAP  3. Screening for breast cancer - MM Digital Screening; Future  4. Screening for colon cancer - Ambulatory referral to Gastroenterology  5. Need for hepatitis C screening test Patient is agreeable for hep C screening - Hepatitis C Antibody  6. Type 2 diabetes mellitus without complication, without long-term current use of insulin (HCC) At goal.  Commended her on changing her eating habits and regular exercise.  Also commended her on 6 pounds weight loss.  Encouraged her to keep up the good work with healthy eating habits and regular exercise.  Continue glipizide - POCT A1C - Ambulatory referral to Ophthalmology - Microalbumin / creatinine urine ratio  7. Morbid obesity ([place]) See #6 above - Lipid panel  8. Essential hypertension At goal of 130/80 or lower.  Continue current medications  9. Chronic combined systolic and diastolic CHF (congestive heart failure) ([place]) Clinically stable and followed by cardiology. - Lipid panel - CBC    Patient was given the opportunity to ask questions.  Patient verbalized  understanding of the plan and was able to repeat key elements of the plan.   Orders Placed This Encounter  Procedures  . MM Digital Screening  . Microalbumin / creatinine urine ratio  . Hepatitis C Antibody  . Lipid panel  . CBC  . Ambulatory referral to Ophthalmology  . Ambulatory referral to Gastroenterology  . POCT A1C     Requested Prescriptions    No prescriptions requested or ordered in this encounter    Return in about 4 months (around 05/19/2019).  Karle Plumber, MD, FACP

## 2019-01-18 LAB — MICROALBUMIN / CREATININE URINE RATIO
Creatinine, Urine: 40.4 mg/dL
Microalb/Creat Ratio: <7 mg/g creat (ref 0–29)
Microalbumin, Urine: <3 ug/mL

## 2019-01-18 LAB — CBC
Hematocrit: 45.3 % (ref 34.0–46.6)
Hemoglobin: 14.6 g/dL (ref 11.1–15.9)
MCH: 28.2 pg (ref 26.6–33.0)
MCHC: 32.2 g/dL (ref 31.5–35.7)
MCV: 88 fL (ref 79–97)
Platelets: 222 10*3/uL (ref 150–450)
RBC: 5.18 x10E6/uL (ref 3.77–5.28)
RDW: 13.5 % (ref 11.7–15.4)
WBC: 5 10*3/uL (ref 3.4–10.8)

## 2019-01-18 LAB — LIPID PANEL
Chol/HDL Ratio: 3.4 ratio (ref 0.0–4.4)
Cholesterol, Total: 171 mg/dL (ref 100–199)
HDL: 50 mg/dL (ref >39)
LDL Calculated: 103 mg/dL — ABNORMAL HIGH (ref 0–99)
Triglycerides: 88 mg/dL (ref 0–149)
VLDL Cholesterol Cal: 18 mg/dL (ref 5–40)

## 2019-01-18 LAB — HEPATITIS C ANTIBODY: Hep C Virus Ab: 0.1 s/co ratio (ref 0.0–0.9)

## 2019-01-20 LAB — CYTOLOGY - PAP
Diagnosis: NEGATIVE
HPV 16/18/45 genotyping: NEGATIVE
HPV: DETECTED — AB

## 2019-01-21 ENCOUNTER — Encounter: Payer: Self-pay | Admitting: Internal Medicine

## 2019-01-21 DIAGNOSIS — R87820 Cervical low risk human papillomavirus (HPV) DNA test positive: Secondary | ICD-10-CM | POA: Insufficient documentation

## 2019-01-26 ENCOUNTER — Other Ambulatory Visit: Payer: Self-pay | Admitting: Internal Medicine

## 2019-01-26 DIAGNOSIS — Z1231 Encounter for screening mammogram for malignant neoplasm of breast: Secondary | ICD-10-CM

## 2019-02-03 MED FILL — ENTRESTO 49 MG-51 MG TABLET: 49-51 | 30 days supply | Qty: 60 | Fill #1

## 2019-02-08 MED FILL — FUROSEMIDE 40 MG TAB: 40 | 30 days supply | Qty: 60 | Fill #8

## 2019-02-08 MED FILL — ATORVASTATIN CALCIUM 40 MG: 40 | 30 days supply | Qty: 30 | Fill #5

## 2019-02-08 MED FILL — glipiZIDE 5 MG TABS: 5 | 30 days supply | Qty: 15 | Fill #1

## 2019-02-08 MED FILL — METOPROLOL SUCCINATE ER 50: 50 | 30 days supply | Qty: 30 | Fill #5

## 2019-02-14 LAB — HM DIABETES EYE EXAM

## 2019-02-16 MED FILL — POTASSIUM CHLORIDE ER 10 ME: 10 | 30 days supply | Qty: 60 | Fill #5

## 2019-03-09 ENCOUNTER — Ambulatory Visit: Payer: BC Managed Care – PPO

## 2019-04-08 ENCOUNTER — Other Ambulatory Visit: Payer: Self-pay

## 2019-04-08 ENCOUNTER — Ambulatory Visit
Admission: RE | Admit: 2019-04-08 | Discharge: 2019-04-08 | Disposition: A | Discharge status: Home / Self Care | Payer: BC Managed Care – PPO | Source: Ambulatory Visit | Attending: Internal Medicine | Admitting: Internal Medicine

## 2019-04-08 DIAGNOSIS — Z1231 Encounter for screening mammogram for malignant neoplasm of breast: Secondary | ICD-10-CM

## 2019-04-15 ENCOUNTER — Other Ambulatory Visit: Payer: Self-pay | Admitting: Internal Medicine

## 2019-04-15 ENCOUNTER — Other Ambulatory Visit: Payer: Self-pay | Admitting: Physician Assistant

## 2019-04-15 MED FILL — METOPROLOL SUCCINATE ER 50: 50 | 30 days supply | Qty: 30 | Fill #1

## 2019-04-15 MED FILL — glipiZIDE 5 MG TABS: 5 | 30 days supply | Qty: 15 | Fill #2

## 2019-04-15 NOTE — Telephone Encounter (Signed)
Outpatient Medication Detail   Disp Refills Start End   sacubitril-valsartan (ENTRESTO) 49-51 MG 60 tablet 11 01/04/2019    Sig - Route: Take 1 tablet by mouth 2 (two) times daily. - Oral   Sent to pharmacy as: sacubitril-valsartan (ENTRESTO) 49-51 MG   Notes to Pharmacy: To replace losartan   E-Prescribing Status: Receipt confirmed by pharmacy (01/04/2019 8:59 AM EDT)   [place], [place]

## 2019-04-18 ENCOUNTER — Other Ambulatory Visit: Payer: Self-pay | Admitting: Pharmacist

## 2019-04-18 MED ORDER — ATORVASTATIN CALCIUM 40 MG PO TABS: 40.0000 mg | ORAL_TABLET | Freq: Every day | ORAL | 2 refills | Status: DC

## 2019-04-18 MED ORDER — FUROSEMIDE 40 MG PO TABS: 40.0000 mg | ORAL_TABLET | Freq: Two times a day (BID) | ORAL | 2 refills | Status: DC

## 2019-04-18 MED ORDER — POTASSIUM CHLORIDE ER 10 MEQ PO TBCR: 10.0000 meq | EXTENDED_RELEASE_TABLET | Freq: Two times a day (BID) | ORAL | 2 refills | Status: DC

## 2019-04-18 MED FILL — ATORVASTATIN CALCIUM 40 MG: 40 | 30 days supply | Qty: 30 | Fill #0

## 2019-04-18 MED FILL — FUROSEMIDE 40 MG TAB: 40 | 30 days supply | Qty: 60 | Fill #0

## 2019-04-18 MED FILL — POTASSIUM CHLORIDE ER 10 ME: 10 | 30 days supply | Qty: 60 | Fill #0

## 2019-04-19 ENCOUNTER — Other Ambulatory Visit: Payer: Self-pay

## 2019-04-19 MED ORDER — SACUBITRIL-VALSARTAN 49-51 MG PO TABS: 1.0000 | ORAL_TABLET | Freq: Two times a day (BID) | ORAL | 11 refills | Status: DC

## 2019-04-27 ENCOUNTER — Other Ambulatory Visit: Payer: Self-pay | Admitting: Critical Care Medicine

## 2019-04-27 ENCOUNTER — Other Ambulatory Visit: Payer: Self-pay | Admitting: Pharmacist

## 2019-04-27 MED ORDER — GLIPIZIDE 5 MG PO TABS: 5.0000 mg | ORAL_TABLET | Freq: Two times a day (BID) | ORAL | 2 refills | Status: DC

## 2019-04-27 MED FILL — CONTOUR NEXT STRIPS: 50 days supply | Qty: 50 | Fill #1

## 2019-04-27 MED FILL — glipiZIDE 5 MG TABS: 5 | 30 days supply | Qty: 60 | Fill #0

## 2019-05-11 ENCOUNTER — Telehealth: Payer: Self-pay | Admitting: *Deleted

## 2019-05-11 DIAGNOSIS — I5042 Chronic combined systolic (congestive) and diastolic (congestive) heart failure: Secondary | ICD-10-CM

## 2019-05-11 DIAGNOSIS — I255 Ischemic cardiomyopathy: Secondary | ICD-10-CM

## 2019-05-11 NOTE — Telephone Encounter (Signed)
Tracey Gallegos is due for a repeat echo and office visit in December - I would like to see her back before clearing her to stop Brilinta for her colonoscopy. Both the echo and recall appt need to be scheduled.  Thanks.  Dr. Lemmie Evens

## 2019-05-11 NOTE — Telephone Encounter (Signed)
I s/w pt today and she has been set up for virtual appt with Dr. Debara Pickett 05/31/19. I have placed order per Dr. Debara Pickett for Limited Echo. I will send a message to Neil Crouch to schedule echo. I will route clearance information to Dr. Debara Pickett for upcoming appt. I will remove from the pre op call back pool.

## 2019-05-11 NOTE — Telephone Encounter (Signed)
Dr. Debara Pickett  Can you please comment on this patients Brilinta therapy? She is for a colonoscopy after 06/29/2019 in which the GI team is asking if she can hold Brilinta for 5 days prior to the procedure.   You last saw her in follow up 01/04/2019. 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   Please send your recommendations to the pre-op pool.   Thank you  Sharee Pimple

## 2019-05-11 NOTE — Telephone Encounter (Signed)
   Primary Cardiologist: Pixie Casino, MD  Chart reviewed as part of pre-operative protocol coverage.  Tracey Gallegos was last seen on 01/04/2019. 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 She is on Brilinta.   Per Dr. Debara Pickett, [name] is due for a repeat echo and office visit in December. He would like to see her back before clearing her to stop Brilinta for her colonoscopy. Both the echo and recall appt need to be scheduled.  Due to new or worsening symptoms, [name] will require a follow-up visit for further pre-operative risk assessment.  Pre-op covering staff: - Please schedule appointments/echocardiogram and call patient to inform them. - Please contact requesting surgeon's office via preferred method (i.e, phone, fax) to inform them of need for appointment prior to surgery.  Kathyrn Drown, NP 05/11/2019, 9:24 AM

## 2019-05-11 NOTE — Telephone Encounter (Signed)
   [facility] Pre-operative Risk Assessment    Request for surgical clearance:  1. What type of surgery is being performed? colonoscopy  2. When is this surgery scheduled? After 06-29-2019  3. What type of clearance is required (medical clearance vs. Pharmacy clearance to hold med vs. Both)? both  4. Are there any medications that need to be held prior to surgery and how long? brilinta for 5 days  5. Practice name and name of physician performing surgery? Dr Alessandra Bevels  6. What is your office phone number [phone]   7.   What is your office fax number  [phone]  8.   Anesthesia type (None, local, MAC, general) ? Propofol    Fredia Beets 05/11/2019, 7:05 AM  _________________________________________________________________   (provider comments below)

## 2019-05-12 ENCOUNTER — Other Ambulatory Visit: Payer: Self-pay | Admitting: Gastroenterology

## 2019-05-18 ENCOUNTER — Other Ambulatory Visit (HOSPITAL_COMMUNITY): Payer: BC Managed Care – PPO

## 2019-05-18 ENCOUNTER — Ambulatory Visit (HOSPITAL_COMMUNITY): Payer: BC Managed Care – PPO | Attending: Internal Medicine

## 2019-05-18 ENCOUNTER — Other Ambulatory Visit: Payer: Self-pay

## 2019-05-18 DIAGNOSIS — I255 Ischemic cardiomyopathy: Secondary | ICD-10-CM | POA: Insufficient documentation

## 2019-05-18 DIAGNOSIS — I5042 Chronic combined systolic (congestive) and diastolic (congestive) heart failure: Secondary | ICD-10-CM | POA: Diagnosis present

## 2019-05-19 ENCOUNTER — Ambulatory Visit: Payer: BC Managed Care – PPO | Attending: Internal Medicine | Admitting: Internal Medicine

## 2019-05-19 MED FILL — ENTRESTO 49 MG-51 MG TABLET: 49-51 | 30 days supply | Qty: 60 | Fill #1

## 2019-05-19 MED FILL — ATORVASTATIN CALCIUM 40 MG: 40 | 30 days supply | Qty: 30 | Fill #1

## 2019-05-19 MED FILL — METOPROLOL SUCCINATE ER 50: 50 | 30 days supply | Qty: 30 | Fill #2

## 2019-05-19 MED FILL — FUROSEMIDE 40 MG TAB: 40 | 30 days supply | Qty: 60 | Fill #1

## 2019-05-19 NOTE — Progress Notes (Signed)
Patient was scheduled for telephone encounter this morning at 9:10.  However we were running behind.  I tried to reach her later in the morning to do the telephone encounter but got a voicemail.  I told her that we will try her again which I did and got voicemail.  I will have my CMA reach out to her to reschedule.

## 2019-05-19 NOTE — Progress Notes (Signed)
Pt states her blood sugar this morning was 106 

## 2019-05-23 ENCOUNTER — Encounter: Payer: Self-pay | Admitting: Internal Medicine

## 2019-05-23 ENCOUNTER — Ambulatory Visit: Payer: BC Managed Care – PPO | Attending: Internal Medicine | Admitting: Internal Medicine

## 2019-05-23 ENCOUNTER — Other Ambulatory Visit: Payer: Self-pay

## 2019-05-23 VITALS — BP 150/75 | HR 57 | Temp 98.7°F | Resp 16 | Wt 271.8 lb

## 2019-05-23 DIAGNOSIS — I10 Essential (primary) hypertension: Secondary | ICD-10-CM

## 2019-05-23 DIAGNOSIS — I5042 Chronic combined systolic (congestive) and diastolic (congestive) heart failure: Secondary | ICD-10-CM

## 2019-05-23 DIAGNOSIS — Z6841 Body Mass Index (BMI) 40.0 and over, adult: Secondary | ICD-10-CM

## 2019-05-23 DIAGNOSIS — E1159 Type 2 diabetes mellitus with other circulatory complications: Secondary | ICD-10-CM | POA: Diagnosis not present

## 2019-05-23 LAB — POCT GLYCOSYLATED HEMOGLOBIN (HGB A1C): HbA1c, POC (prediabetic range): 6.2 % (ref 5.7–6.4)

## 2019-05-23 LAB — GLUCOSE, POCT (MANUAL RESULT ENTRY): POC Glucose: 170 mg/dl — AB (ref 70–99)

## 2019-05-23 MED ORDER — AMLODIPINE BESYLATE 5 MG PO TABS: 5.0000 mg | ORAL_TABLET | Freq: Every day | ORAL | 3 refills | Status: DC

## 2019-05-23 MED FILL — AMLODIPINE BESYLATE 5 MG TA: 5 | 30 days supply | Qty: 30 | Fill #0

## 2019-05-23 NOTE — Patient Instructions (Signed)
Your blood pressure is not at goal.  We have added a low-dose of a blood pressure medication called amlodipine 5 mg that you will take daily.  Continue your other medications.  Remember to talk with your cardiologist about whether it would be okay for you to stop the Brilinta and aspirin for 1 week prior to the colonoscopy.Marland Kitchen

## 2019-05-23 NOTE — Progress Notes (Signed)
Patient ID: Tracey Gallegos, female    DOB: 21-Jun-1956  MRN: [id number]  CC: Diabetes and Hypertension   Subjective: Tracey Gallegos is a 63 y.o. female who presents for chronic ds management Her concerns today include:  Patient with history of HTN, DM type II,CAD with DES to LT CXM, Diastolic and systolic CHFwith EF of 25 to 30%,moderate pulmonary hypertension on echo 04/2018, morbid obesity.   DIABETES TYPE 2/Obesity Last A1C:   Results for orders placed or performed in visit on 05/23/19  POCT glucose (manual entry)  Result Value Ref Range   POC Glucose 170 (A) 70 - 99 mg/dl  POCT glycosylated hemoglobin (Hb A1C)  Result Value Ref Range   Hemoglobin A1C     HbA1c POC (<> result, manual entry)     HbA1c, POC (prediabetic range) 6.2 5.7 - 6.4 %   HbA1c, POC (controlled diabetic range)      Med Adherence:  '[x]'$  Yes -she is on Glucotrol    '[]'$  No Medication side effects:  '[x]'$  Yes    '[]'$  No Home Monitoring?  '[x]'$  Yes  -in mornings Home glucose results range:  Less 130. Usually 106-110 Diet Adherence: '[x]'$  Yes - avoids keeping junk foods in the house Exercise: '[x]'$  Yes    '[]'$  No Hypoglycemic episodes?: '[x]'$  Yes -one episode of BS in 80 Numbness of the feet? '[]'$  Yes    '[x]'$  No Retinopathy hx? '[]'$  Yes    '[]'$  No Last eye exam: no blurred vision Comments:  Going to gym 2-3 days/wk and walks on treadmill for 1/2 hr  HYPERTENSION/CAD/combined CHF Currently taking: see medication list Med Adherence: '[x]'$  Yes    '[]'$  No Medication side effects: '[x]'$  Yes    '[]'$  No Adherence with salt restriction: '[x]'$  Yes    '[]'$  No Home Monitoring?: '[]'$  Yes    '[x]'$  No Monitoring Frequency: '[]'$  Yes    '[]'$  No Home BP results range: '[]'$  Yes    '[]'$  No SOB? '[]'$  Yes    '[x]'$  No Chest Pain?: '[]'$  Yes    '[x]'$  No Leg swelling?: '[]'$  Yes    '[x]'$  No Headaches?: '[]'$  Yes    '[x]'$  No Dizziness? '[]'$  Yes    '[x]'$  No Comments:  Has app with cardiology next wk. recently had echo that revealed improvement in EF to 55-60%  C-scope:  Had video visit with  Buccini.  Scheduled for colonoscopy in 06/2019   Patient Active Problem List   Diagnosis Date Noted  . Papanicolaou smear of cervix with low risk human papillomavirus (HPV) DNA test positive 01/21/2019  . Influenza vaccination declined 07/20/2018  . Chronic combined systolic and diastolic CHF (congestive heart failure) ([place]) 07/20/2018  . Positive depression screening 07/20/2018  . CAD (coronary artery disease) 07/09/2018  . Hypertensive heart disease with acute on chronic systolic congestive heart failure ([place]) 06/24/2018  . Type 2 diabetes mellitus without complication, without long-term current use of insulin ([place]) 06/24/2018  . Morbid obesity ([place])      Current Outpatient Medications on File Prior to Visit  Medication Sig Dispense Refill  . aspirin (ASPIRIN 81) 81 MG EC tablet Take 1 tablet (81 mg total) by mouth daily. 30 tablet 12  . atorvastatin (LIPITOR) 40 MG tablet Take 1 tablet (40 mg total) by mouth daily at 6 PM. 30 tablet 2  . Blood Glucose Monitoring Suppl (CONTOUR NEXT MONITOR) w/Device KIT 1 kit by Does not apply route daily. Use to check blood sugar daily. E11.9 1 kit 0  .  furosemide (LASIX) 40 MG tablet Take 1 tablet (40 mg total) by mouth 2 (two) times daily. 60 tablet 2  . glipiZIDE (GLUCOTROL) 5 MG tablet Take 1 tablet (5 mg total) by mouth 2 (two) times daily before a meal. 60 tablet 2  . glucose blood (CONTOUR NEXT TEST) test strip Use as instructed to check blood sugar daily. E11.9 100 each 11  . metoprolol succinate (TOPROL-XL) 50 MG 24 hr tablet Take 1 tablet (50 mg total) by mouth daily. Take with or immediately following a meal. 30 tablet 11  . Microlet Lancets MISC Use to check blood sugar daily. E11.9 100 each 11  . Omega-3 Fatty Acids (FISH OIL PO) Take 1,000 mg by mouth daily.     . potassium chloride (K-DUR) 10 MEQ tablet Take 1 tablet (10 mEq total) by mouth 2 (two) times daily. 60 tablet 2  . sacubitril-valsartan (ENTRESTO) 49-51 MG Take 1 tablet by  mouth 2 (two) times daily. 60 tablet 11  . ticagrelor (BRILINTA) 90 MG TABS tablet Take 1 tablet (90 mg total) by mouth 2 (two) times daily. 60 tablet 4   No current facility-administered medications on file prior to visit.     Allergies  Allergen Reactions  . Lisinopril Cough    Social History   Socioeconomic History  . Marital status: Single    Spouse name: Not on file  . Number of children: 0  . Years of education: Not on file  . Highest education level: Not on file  Occupational History  . Not on file  Social Needs  . Financial resource strain: Not on file  . Food insecurity    Worry: Not on file    Inability: Not on file  . Transportation needs    Medical: Not on file    Non-medical: Not on file  Tobacco Use  . Smoking status: Never Smoker  . Smokeless tobacco: Never Used  Substance and Sexual Activity  . Alcohol use: Yes    Comment: occasionally  . Drug use: Never  . Sexual activity: Not Currently  Lifestyle  . Physical activity    Days per week: Not on file    Minutes per session: Not on file  . Stress: Not on file  Relationships  . Social Herbalist on phone: Not on file    Gets together: Not on file    Attends religious service: Not on file    Active member of club or organization: Not on file    Attends meetings of clubs or organizations: Not on file    Relationship status: Not on file  . Intimate partner violence    Fear of current or ex partner: Not on file    Emotionally abused: Not on file    Physically abused: Not on file    Forced sexual activity: Not on file  Other Topics Concern  . Not on file  Social History Narrative  . Not on file    Family History  Problem Relation Age of Onset  . Diabetes Mother   . Colon cancer Brother     Past Surgical History:  Procedure Laterality Date  . CARDIAC CATHETERIZATION  06/28/2018  . CORONARY STENT INTERVENTION N/A 06/28/2018   Procedure: CORONARY STENT INTERVENTION;  Surgeon:  Leonie Man, MD;  Location: [facility];  Service: Cardiovascular;  Laterality: N/A;  . CORONARY STENT PLACEMENT  06/28/2018  . RIGHT/LEFT HEART CATH AND CORONARY ANGIOGRAPHY N/A 06/28/2018   Procedure: RIGHT/LEFT  HEART CATH AND CORONARY ANGIOGRAPHY;  Surgeon: Leonie Man, MD;  Location: [facility];  Service: Cardiovascular;  Laterality: N/A;  . TOTAL KNEE ARTHROPLASTY Left 05/2016    ROS: Review of Systems Negative except as stated above  PHYSICAL EXAM: BP (!) 150/75   Pulse (!) 57   Temp 98.7 F (37.1 C) (Oral)   Resp 16   Wt 271 lb 12.8 oz (123.3 kg)   SpO2 97%   BMI 41.33 kg/m   Wt Readings from Last 3 Encounters:  05/23/19 271 lb 12.8 oz (123.3 kg)  01/17/19 264 lb (119.7 kg)  01/04/19 270 lb (122.5 kg)    Physical Exam  General appearance - alert, well appearing, and in no distress Mental status - normal mood, behavior, speech, dress, motor activity, and thought processes Mouth - mucous membranes moist, pharynx normal without lesions Neck - supple, no significant adenopathy Chest - clear to auscultation, no wheezes, rales or rhonchi, symmetric air entry Heart - normal rate, regular rhythm, normal S1, S2, no murmurs, rubs, clicks or gallops Extremities - peripheral pulses normal, no pedal edema, no clubbing or cyanosis Diabetic Foot Exam - Simple   Simple Foot Form Visual Inspection See comments: Yes Sensation Testing Intact to touch and monofilament testing bilaterally: Yes Pulse Check Posterior Tibialis and Dorsalis pulse intact bilaterally: Yes Comments Mild hammertoes on the right foot.     CMP Latest Ref Rng & Units 07/27/2018 07/09/2018 06/30/2018  Glucose 65 - 99 mg/dL 141(H) 144(H) 136(H)  BUN 8 - 27 mg/dL 20 22 26(H)  Creatinine 0.57 - 1.00 mg/dL 1.06(H) 1.08(H) 1.18(H)  Sodium 134 - 144 mmol/L 142 145(H) 140  Potassium 3.5 - 5.2 mmol/L 4.3 3.8 4.0  Chloride 96 - 106 mmol/L 102 105 103  CO2 20 - 29 mmol/L '22 21 27  '$ Calcium  8.7 - 10.3 mg/dL 9.5 9.7 9.5  Total Protein 6.0 - 8.5 g/dL 6.9 - -  Total Bilirubin 0.0 - 1.2 mg/dL 0.5 - -  Alkaline Phos 39 - 117 IU/L 128(H) - -  AST 0 - 40 IU/L 33 - -  ALT 0 - 32 IU/L 33(H) - -   Lipid Panel     Component Value Date/Time   CHOL 171 01/17/2019 1342   TRIG 88 01/17/2019 1342   HDL 50 01/17/2019 1342   CHOLHDL 3.4 01/17/2019 1342   CHOLHDL 5.5 04/27/2018 1513   VLDL 17 04/27/2018 1513   LDLCALC 103 (H) 01/17/2019 1342    CBC    Component Value Date/Time   WBC 5.0 01/17/2019 1342   WBC 5.4 06/30/2018 0401   RBC 5.18 01/17/2019 1342   RBC 5.42 (H) 06/30/2018 0401   HGB 14.6 01/17/2019 1342   HCT 45.3 01/17/2019 1342   PLT 222 01/17/2019 1342   MCV 88 01/17/2019 1342   MCH 28.2 01/17/2019 1342   MCH 24.7 (L) 06/30/2018 0401   MCHC 32.2 01/17/2019 1342   MCHC 30.3 06/30/2018 0401   RDW 13.5 01/17/2019 1342   LYMPHSABS 1.7 07/09/2018 0937   MONOABS 0.5 06/24/2018 1305   EOSABS 0.6 (H) 07/09/2018 0937   BASOSABS 0.1 07/09/2018 0937    ASSESSMENT AND PLAN: 1. Type 2 diabetes mellitus with other circulatory complication, without long-term current use of insulin (HCC) A1c at goal.  Continue current dose of Glucotrol.  Encouraged her to continue to avoid junk food.  Commended her on starting her exercise routine - POCT glucose (manual entry) - POCT glycosylated hemoglobin (Hb A1C)  2. Essential hypertension Not at goal.  Continue current medications.  Add low-dose amlodipine - amLODipine (NORVASC) 5 MG tablet; Take 1 tablet (5 mg total) by mouth daily.  Dispense: 30 tablet; Refill: 3  3. Class 3 severe obesity due to excess calories with serious comorbidity and body mass index (BMI) of 40.0 to 44.9 in adult [facility]) See #1 above  4. Chronic combined systolic and diastolic CHF (congestive heart failure) (HCC) Clinically stable.  Continue metoprolol, furosemide, Entresto.  She is followed by cardiology.  She sees her cardiologist next week.  I have reminded  her to speak with him about whether it would be okay for her to stop the Brilinta and aspirin for a week prior to her colonoscopy in December.    Patient was given the opportunity to ask questions.  Patient verbalized understanding of the plan and was able to repeat key elements of the plan.   Orders Placed This Encounter  Procedures  . POCT glucose (manual entry)  . POCT glycosylated hemoglobin (Hb A1C)     Requested Prescriptions   Signed Prescriptions Disp Refills  . amLODipine (NORVASC) 5 MG tablet 30 tablet 3    Sig: Take 1 tablet (5 mg total) by mouth daily.    No follow-ups on file.  Karle Plumber, MD, FACP

## 2019-05-30 MED FILL — POTASSIUM CHLORIDE ER 10 ME: 10 | 30 days supply | Qty: 60 | Fill #1

## 2019-05-30 MED FILL — glipiZIDE 5 MG TABS: 5 | 30 days supply | Qty: 60 | Fill #1

## 2019-05-31 ENCOUNTER — Telehealth (INDEPENDENT_AMBULATORY_CARE_PROVIDER_SITE_OTHER): Payer: BC Managed Care – PPO | Admitting: Internal Medicine

## 2019-05-31 ENCOUNTER — Encounter: Payer: Self-pay | Admitting: Internal Medicine

## 2019-05-31 VITALS — Ht 68.0 in | Wt 260.0 lb

## 2019-05-31 DIAGNOSIS — I10 Essential (primary) hypertension: Secondary | ICD-10-CM | POA: Diagnosis not present

## 2019-05-31 DIAGNOSIS — I2510 Atherosclerotic heart disease of native coronary artery without angina pectoris: Secondary | ICD-10-CM | POA: Diagnosis not present

## 2019-05-31 DIAGNOSIS — I255 Ischemic cardiomyopathy: Secondary | ICD-10-CM | POA: Diagnosis not present

## 2019-05-31 DIAGNOSIS — I5042 Chronic combined systolic (congestive) and diastolic (congestive) heart failure: Secondary | ICD-10-CM | POA: Diagnosis not present

## 2019-05-31 DIAGNOSIS — Z955 Presence of coronary angioplasty implant and graft: Secondary | ICD-10-CM

## 2019-05-31 DIAGNOSIS — Z01810 Encounter for preprocedural cardiovascular examination: Secondary | ICD-10-CM

## 2019-05-31 MED ORDER — FUROSEMIDE 40 MG PO TABS: 40.0000 mg | ORAL_TABLET | Freq: Every day | ORAL | 2 refills | Status: DC

## 2019-05-31 NOTE — Progress Notes (Signed)
Virtual Visit via Telephone Note   This visit type was conducted due to national recommendations for restrictions regarding the COVID-19 Pandemic (e.g. social distancing) in an effort to limit this patient's exposure and mitigate transmission in our community.  Due to her co-morbid illnesses, this patient is at least at moderate risk for complications without adequate follow up.  This format is felt to be most appropriate for this patient at this time.  The patient did not have access to video technology/had technical difficulties with video requiring transitioning to audio format only (telephone).  All issues noted in this document were discussed and addressed.  No physical exam could be performed with this format.  Please refer to the patient's chart for her  consent to telehealth for [facility].   Evaluation Performed:  Telephone follow-up  Date:  05/31/2019   ID:  Tracey Gallegos, DOB 06/23/1956, MRN [id number]  Patient Location:  [place] [address]  Provider location:   [address], [place], [place] [address]  PCP:  Ladell Pier, MD  Cardiologist:  Pixie Casino, MD Electrophysiologist:  None   Chief Complaint:  No complaints  History of Present Illness:    Tracey Gallegos is a 63 y.o. female who presents via audio/video conferencing for a telehealth visit today.  Tracey Gallegos was seen today in follow-up.  Overall she seems to be doing well.  She is now almost a year out from her catheterization and stent placement.  She has been on aspirin and Brilinta with a plan to discontinue Brilinta as of June 30, 2019.  We have been asked to comment on whether she could have an elective colonoscopy.  I would advise that that be deferred until after she stops Brilinta.  She would remain on aspirin.  We did repeat an echocardiogram in the end of October, this does show normalization of LV function.  This after starting Entresto 49/51 mg twice daily recently.  She  remains on Lasix 40 mg twice daily as well as metoprolol, aspirin and atorvastatin.  Overall she feels well and has generally NYHA class I-II symptoms.  The patient does not have symptoms concerning for COVID-19 infection (fever, chills, cough, or new SHORTNESS OF BREATH).    Prior CV studies:   The following studies were reviewed today:  Chart reviewed Echo reviewed  PMHx:  Past Medical History:  Diagnosis Date  . CHF (congestive heart failure) ([place])   . Coronary artery disease   . Diabetes mellitus without complication ([place])   . Dyspnea   . Hypertension     Past Surgical History:  Procedure Laterality Date  . CARDIAC CATHETERIZATION  06/28/2018  . CORONARY STENT INTERVENTION N/A 06/28/2018   Procedure: CORONARY STENT INTERVENTION;  Surgeon: Leonie Man, MD;  Location: [facility];  Service: Cardiovascular;  Laterality: N/A;  . CORONARY STENT PLACEMENT  06/28/2018  . RIGHT/LEFT HEART CATH AND CORONARY ANGIOGRAPHY N/A 06/28/2018   Procedure: RIGHT/LEFT HEART CATH AND CORONARY ANGIOGRAPHY;  Surgeon: Leonie Man, MD;  Location: [facility];  Service: Cardiovascular;  Laterality: N/A;  . TOTAL KNEE ARTHROPLASTY Left 05/2016    FAMHx:  Family History  Problem Relation Age of Onset  . Diabetes Mother   . Colon cancer Brother     SOCHx:   reports that she has never smoked. She has never used smokeless tobacco. She reports current alcohol use. She reports that she does not use drugs.  ALLERGIES:  Allergies  Allergen Reactions  . Lisinopril Cough    MEDS:  Current Meds  Medication Sig  . amLODipine (NORVASC) 5 MG tablet Take 1 tablet (5 mg total) by mouth daily.  Marland Kitchen aspirin (ASPIRIN 81) 81 MG EC tablet Take 1 tablet (81 mg total) by mouth daily.  Marland Kitchen atorvastatin (LIPITOR) 40 MG tablet Take 1 tablet (40 mg total) by mouth daily at 6 PM.  . Blood Glucose Monitoring Suppl (CONTOUR NEXT MONITOR) w/Device KIT 1 kit by Does not apply route daily. Use to  check blood sugar daily. E11.9  . furosemide (LASIX) 40 MG tablet Take 1 tablet (40 mg total) by mouth daily.  Marland Kitchen glipiZIDE (GLUCOTROL) 5 MG tablet Take 1 tablet (5 mg total) by mouth 2 (two) times daily before a meal.  . glucose blood (CONTOUR NEXT TEST) test strip Use as instructed to check blood sugar daily. E11.9  . metoprolol succinate (TOPROL-XL) 50 MG 24 hr tablet Take 1 tablet (50 mg total) by mouth daily. Take with or immediately following a meal.  . Microlet Lancets MISC Use to check blood sugar daily. E11.9  . Omega-3 Fatty Acids (FISH OIL PO) Take 1,000 mg by mouth daily.   . potassium chloride (K-DUR) 10 MEQ tablet Take 1 tablet (10 mEq total) by mouth 2 (two) times daily.  . sacubitril-valsartan (ENTRESTO) 49-51 MG Take 1 tablet by mouth 2 (two) times daily.  . [DISCONTINUED] furosemide (LASIX) 40 MG tablet Take 1 tablet (40 mg total) by mouth 2 (two) times daily.  . [DISCONTINUED] ticagrelor (BRILINTA) 90 MG TABS tablet Take 1 tablet (90 mg total) by mouth 2 (two) times daily.     ROS: Pertinent items noted in HPI and remainder of comprehensive ROS otherwise negative.  Labs/Other Tests and Data Reviewed:    Recent Labs: 06/24/2018: B Natriuretic Peptide 1,177.4 06/28/2018: Magnesium 2.0 07/27/2018: ALT 33; BUN 20; Creatinine, Ser 1.06; Potassium 4.3; Sodium 142 01/17/2019: Hemoglobin 14.6; Platelets 222   Recent Lipid Panel Lab Results  Component Value Date/Time   CHOL 171 01/17/2019 01:42 PM   TRIG 88 01/17/2019 01:42 PM   HDL 50 01/17/2019 01:42 PM   CHOLHDL 3.4 01/17/2019 01:42 PM   CHOLHDL 5.5 04/27/2018 03:13 PM   LDLCALC 103 (H) 01/17/2019 01:42 PM    Wt Readings from Last 3 Encounters:  05/31/19 260 lb (117.9 kg)  05/23/19 271 lb 12.8 oz (123.3 kg)  01/17/19 264 lb (119.7 kg)     Exam:    Vital Signs:  Ht '5\' 8"'$  (1.727 m)   Wt 260 lb (117.9 kg)   BMI 39.53 kg/m    Exam deferred due to telephone visit  ASSESSMENT & PLAN:    1. Coronary artery disease  status post DES x2 to the first marginal and circumflex arteries (06/2018) 2. Acute on chronic systolic congestive heart failure, LVEF 25 to 30% (04/2018) - improved to 55-60% (04/2019) 3. NYHA class I-II symptoms 4. Morbid obesity 5. Poorly controlled hypertension 6. History of medication noncompliance  Ms. Guitron is doing much better.  Her LVEF is now improved up to 55 to 60% as of October 2020.  She will need at least 1 more month of Brilinta and then can be switched to aspirin.  I think we can decrease her Lasix to 40 mg once a day.  In general now she is NYHA class I-II symptoms.  She has been more compliant with her medications and I think this is part of her overall improvement.  She could be  okay to go ahead with colonoscopy after June 30, 2019 and will follow up with me in 6 months or sooner as necessary.  COVID-19 Education: The signs and symptoms of COVID-19 were discussed with the patient and how to seek care for testing (follow up with PCP or arrange E-visit).  The importance of social distancing was discussed today.  Patient Risk:   After full review of this patients clinical status, I feel that they are at least moderate risk at this time.  Time:   Today, I have spent 25 minutes with the patient with telehealth technology discussing ischemic cardiomyopathy, heart failure management, medication compliance, dyslipidemia, hypertension.     Medication Adjustments/Labs and Tests Ordered: Current medicines are reviewed at length with the patient today.  Concerns regarding medicines are outlined above.   Tests Ordered: No orders of the defined types were placed in this encounter.   Medication Changes: Meds ordered this encounter  Medications  . furosemide (LASIX) 40 MG tablet    Sig: Take 1 tablet (40 mg total) by mouth daily.    Dispense:  30 tablet    Refill:  2    Disposition:  in 6 month(s)  Pixie Casino, MD, [facility], [place]  Director of the Advanced Lipid Disorders &  Cardiovascular Risk Reduction Clinic Diplomate of the American Board of Clinical Lipidology Attending Cardiologist  Direct Dial: [phone]  Fax: [phone]  Website:  [email or web address]  Pixie Casino, MD  05/31/2019 10:37 AM

## 2019-05-31 NOTE — Patient Instructions (Signed)
Medication Instructions:  Stop Brilinta in 1 month (June 30, 2019) Continue Aspirin  Decrease Lasix to 40 mg daily. Continue present plan and medications. *If you need a refill on your cardiac medications before your next appointment, please call your pharmacy*  Follow-Up: At [facility], you and your health needs are our priority.  As part of our continuing mission to provide you with exceptional heart care, we have created designated Provider Care Teams.  These Care Teams include your primary Cardiologist (physician) and Advanced Practice Providers (APPs -  Physician Assistants and Nurse Practitioners) who all work together to provide you with the care you need, when you need it.  Your next appointment:   6 months  The format for your next appointment:   In Person  Provider:   You may see Pixie Casino, MD or one of the following Advanced Practice Providers on your designated Care Team:    Almyra Deforest, PA-C  Fabian Sharp, PA-C or   Roby Lofts, [place]   Other Instructions No colonoscopy until you have been off of [place]

## 2019-06-20 ENCOUNTER — Other Ambulatory Visit: Payer: Self-pay | Admitting: Physician Assistant

## 2019-06-20 MED FILL — ENTRESTO 49 MG-51 MG TABLET: 49-51 | 30 days supply | Qty: 60 | Fill #2

## 2019-06-20 MED FILL — AMLODIPINE BESYLATE 5 MG TA: 5 | 30 days supply | Qty: 30 | Fill #1

## 2019-06-21 MED FILL — METOPROLOL SUCCINATE ER 50: 50 | 90 days supply | Qty: 90 | Fill #0

## 2019-06-29 MED FILL — ATORVASTATIN CALCIUM 40 MG: 40 | 30 days supply | Qty: 30 | Fill #2

## 2019-06-29 MED FILL — CONTOUR NEXT STRIPS: 50 days supply | Qty: 50 | Fill #2

## 2019-06-29 MED FILL — POTASSIUM CHLORIDE ER 10 ME: 10 | 30 days supply | Qty: 60 | Fill #2

## 2019-06-29 MED FILL — FUROSEMIDE 40 MG TAB: 40 | 90 days supply | Qty: 90 | Fill #0

## 2019-07-25 ENCOUNTER — Other Ambulatory Visit: Payer: Self-pay | Admitting: Gastroenterology

## 2019-07-25 DIAGNOSIS — Z800 Family history of malignant neoplasm of digestive organs: Secondary | ICD-10-CM

## 2019-07-27 ENCOUNTER — Other Ambulatory Visit: Payer: Self-pay | Admitting: Internal Medicine

## 2019-07-27 MED FILL — AMLODIPINE BESYLATE 5 MG TA: 5 | 30 days supply | Qty: 30 | Fill #2

## 2019-07-27 MED FILL — ENTRESTO 49 MG-51 MG TABLET: 49-51 | 30 days supply | Qty: 60 | Fill #3

## 2019-07-27 MED FILL — glipiZIDE 5 MG TABS: 5 | 30 days supply | Qty: 60 | Fill #2

## 2019-07-29 MED FILL — POTASSIUM CHLORIDE ER 10 ME: 10 | 30 days supply | Qty: 60 | Fill #0

## 2019-07-29 MED FILL — ATORVASTATIN CALCIUM 40 MG: 40 | 30 days supply | Qty: 30 | Fill #0

## 2019-08-15 ENCOUNTER — Inpatient Hospital Stay: Admission: RE | Admit: 2019-08-15 | Payer: BLUE CROSS/BLUE SHIELD | Source: Ambulatory Visit

## 2019-08-30 ENCOUNTER — Other Ambulatory Visit: Payer: Self-pay | Admitting: Internal Medicine

## 2019-08-30 MED FILL — ATORVASTATIN CALCIUM 40 MG: 40 | 30 days supply | Qty: 30 | Fill #1

## 2019-08-30 MED FILL — ENTRESTO 49 MG-51 MG TABLET: 49-51 | 30 days supply | Qty: 60 | Fill #4

## 2019-08-30 MED FILL — POTASSIUM CHLORIDE ER 10 ME: 10 | 30 days supply | Qty: 60 | Fill #1

## 2019-08-30 MED FILL — glipiZIDE 5 MG TABS: 5 | 30 days supply | Qty: 60 | Fill #0

## 2019-08-30 MED FILL — CONTOUR NEXT STRIPS: 50 days supply | Qty: 50 | Fill #3

## 2019-08-30 MED FILL — AMLODIPINE BESYLATE 5 MG TA: 5 | 30 days supply | Qty: 30 | Fill #3

## 2019-09-20 MED FILL — METOPROLOL SUCCINATE ER 50: 50 | 90 days supply | Qty: 90 | Fill #1

## 2019-09-28 ENCOUNTER — Other Ambulatory Visit: Payer: Self-pay | Admitting: Internal Medicine

## 2019-09-28 DIAGNOSIS — I10 Essential (primary) hypertension: Secondary | ICD-10-CM

## 2019-09-28 MED FILL — POTASSIUM CHLORIDE ER 10 ME: 10 | 30 days supply | Qty: 60 | Fill #2

## 2019-09-28 MED FILL — ENTRESTO 49 MG-51 MG TABLET: 49-51 | 30 days supply | Qty: 60 | Fill #5

## 2019-09-28 MED FILL — ATORVASTATIN CALCIUM 40 MG: 40 | 30 days supply | Qty: 30 | Fill #2

## 2019-09-28 MED FILL — glipiZIDE 5 MG TABS: 5 | 30 days supply | Qty: 60 | Fill #1

## 2019-09-29 MED FILL — AMLODIPINE BESYLATE 5 MG TA: 5 | 30 days supply | Qty: 30 | Fill #0

## 2019-10-10 ENCOUNTER — Other Ambulatory Visit: Payer: Self-pay | Admitting: Internal Medicine

## 2019-10-10 MED FILL — FUROSEMIDE 40 MG TAB: 40 | 90 days supply | Qty: 90 | Fill #0

## 2019-10-14 ENCOUNTER — Ambulatory Visit: Payer: BLUE CROSS/BLUE SHIELD | Attending: Internal Medicine

## 2019-10-14 DIAGNOSIS — Z23 Encounter for immunization: Secondary | ICD-10-CM

## 2019-10-14 NOTE — Progress Notes (Signed)
   Covid-19 Vaccination Clinic  Name:  Tracey Gallegos    MRN: [id number] DOB: [birth year]  10/14/2019  [name] was observed post Covid-19 immunization for 15 minutes without incident. She was provided with Vaccine Information Sheet and instruction to access the V-Safe system.   [name] was instructed to call 911 with any severe reactions post vaccine: Marland Kitchen Difficulty breathing  . Swelling of face and throat  . A fast heartbeat  . A bad rash all over body  . Dizziness and weakness   Immunizations Administered    Name Date Dose VIS Date Route   Pfizer COVID-19 Vaccine 10/14/2019  2:34 AM 0.3 mL 07/01/2019 Intramuscular   Manufacturer: [place]   Lot: [id number]   [place]: [id number]

## 2019-10-26 ENCOUNTER — Other Ambulatory Visit: Payer: Self-pay | Admitting: Internal Medicine

## 2019-10-26 DIAGNOSIS — I10 Essential (primary) hypertension: Secondary | ICD-10-CM

## 2019-10-26 MED FILL — AMLODIPINE BESYLATE 5 MG TA: 5 | 30 days supply | Qty: 30 | Fill #0

## 2019-10-26 MED FILL — CONTOUR NEXT STRIPS: 50 days supply | Qty: 50 | Fill #4

## 2019-10-31 ENCOUNTER — Other Ambulatory Visit: Payer: Self-pay | Admitting: Internal Medicine

## 2019-10-31 MED FILL — ENTRESTO 49 MG-51 MG TABLET: 49-51 | 30 days supply | Qty: 60 | Fill #6

## 2019-10-31 MED FILL — glipiZIDE 5 MG TABS: 5 | 30 days supply | Qty: 60 | Fill #2

## 2019-11-01 MED FILL — ATORVASTATIN CALCIUM 40 MG: 40 | 30 days supply | Qty: 30 | Fill #0

## 2019-11-01 MED FILL — POTASSIUM CHLORIDE ER 10 ME: 10 | 30 days supply | Qty: 60 | Fill #0

## 2019-11-09 ENCOUNTER — Ambulatory Visit: Payer: BLUE CROSS/BLUE SHIELD | Attending: Internal Medicine

## 2019-11-09 DIAGNOSIS — Z23 Encounter for immunization: Secondary | ICD-10-CM

## 2019-11-09 NOTE — Progress Notes (Signed)
   Covid-19 Vaccination Clinic  Name:  Tracey Gallegos    MRN: [id number] DOB: [birth year]  11/09/2019  [name] was observed post Covid-19 immunization for 15 minutes without incident. She was provided with Vaccine Information Sheet and instruction to access the V-Safe system.   [name] was instructed to call 911 with any severe reactions post vaccine: Marland Kitchen Difficulty breathing  . Swelling of face and throat  . A fast heartbeat  . A bad rash all over body  . Dizziness and weakness   Immunizations Administered    Name Date Dose VIS Date Route   Pfizer COVID-19 Vaccine 11/09/2019  2:21 PM 0.3 mL 09/14/2018 Intramuscular   Manufacturer: [place]   Lot: [id number]   [place]: [id number]

## 2019-11-23 ENCOUNTER — Other Ambulatory Visit: Payer: Self-pay | Admitting: Internal Medicine

## 2019-11-23 DIAGNOSIS — I10 Essential (primary) hypertension: Secondary | ICD-10-CM

## 2019-12-01 ENCOUNTER — Telehealth: Payer: Self-pay

## 2019-12-01 ENCOUNTER — Other Ambulatory Visit: Payer: Self-pay | Admitting: Internal Medicine

## 2019-12-01 DIAGNOSIS — I10 Essential (primary) hypertension: Secondary | ICD-10-CM

## 2019-12-01 MED ORDER — AMLODIPINE BESYLATE 5 MG PO TABS: 5.0000 mg | ORAL_TABLET | Freq: Every day | ORAL | 0 refills | Status: DC

## 2019-12-01 NOTE — Telephone Encounter (Signed)
Rx sent 

## 2019-12-01 NOTE — Telephone Encounter (Signed)
1) Medication(s) Requested (by name): amlodipine  2) Pharmacy of Choice: [facility]  3) Special Requests: provider did not authorize refill until seen.  Pt appt 01/06/20 with Wynetta Emery. Pt phone [phone]   Approved medications will be sent to the pharmacy, we will reach out if there is an issue.  Requests made after 3pm may not be addressed until the following business day!  If a patient is unsure of the name of the medication(s) please note and ask patient to call back when they are able to provide all info, do not send to responsible party until all information is available!

## 2019-12-09 ENCOUNTER — Other Ambulatory Visit: Payer: Self-pay

## 2019-12-09 ENCOUNTER — Ambulatory Visit (INDEPENDENT_AMBULATORY_CARE_PROVIDER_SITE_OTHER): Payer: BLUE CROSS/BLUE SHIELD | Admitting: Internal Medicine

## 2019-12-09 ENCOUNTER — Encounter: Payer: Self-pay | Admitting: Internal Medicine

## 2019-12-09 VITALS — BP 132/74 | HR 65 | Ht 68.0 in | Wt 272.0 lb

## 2019-12-09 DIAGNOSIS — I5042 Chronic combined systolic (congestive) and diastolic (congestive) heart failure: Secondary | ICD-10-CM | POA: Diagnosis not present

## 2019-12-09 DIAGNOSIS — I5023 Acute on chronic systolic (congestive) heart failure: Secondary | ICD-10-CM

## 2019-12-09 DIAGNOSIS — E785 Hyperlipidemia, unspecified: Secondary | ICD-10-CM

## 2019-12-09 DIAGNOSIS — I255 Ischemic cardiomyopathy: Secondary | ICD-10-CM

## 2019-12-09 DIAGNOSIS — I110 Hypertensive heart disease with heart failure: Secondary | ICD-10-CM | POA: Diagnosis not present

## 2019-12-09 DIAGNOSIS — I10 Essential (primary) hypertension: Secondary | ICD-10-CM

## 2019-12-09 NOTE — Progress Notes (Signed)
OFFICE NOTE  Chief Complaint:  Follow-up hospitalization  Primary Care Physician: Ladell Pier, MD  HPI:  Tracey Gallegos is a 64 y.o. female with a past medial history significant for congestive heart failure with a diagnosis of acute systolic congestive heart failure in 2006 in [place].  The details are not totally clear however she said she was living in [place] at the time.  She may have had work-up in [facility].  We will try to obtain records from her primary care provider.  She said that she was treated with diuretics and that she did have a stress test which is apparently negative for ischemia.  Subsequently she has been noncompliant with medications and is moved around, ultimately recently moving to [place].  She was admitted for acute on chronic systolic congestive heart failure.  An echo showed EF of 25 to 30% with severe global hypokinesis and grade 3 diastolic dysfunction.  There was mild mitral regurgitation, severe left atrial enlargement and moderate pulmonary hypertension with an RVSP of 66 mmHg.  She was diuresed and is down to about 5 kg since discharge.  He reports persistent, dry cough.  She is on lisinopril but says she is taken in the past.  She denies any chest pain.  She does not have a local primary care provider but was encouraged to establish with community health and wellness.  07/07/2018  Tracey Gallegos returns today for follow-up.  Unfortunately she was recently hospitalized for acute on chronic systolic congestive heart failure.  I saw her during that hospitalization and had recommended cardiac catheterization.  Because of her cardiomyopathy and ongoing symptoms, I was concerned about a possible ischemic etiology.  Did undergo left and right heart catheterization on 06/28/2018.  This demonstrated a proximal to mid circumflex and first marginal lesion, treated with 2 Xience [place] drug-eluting stents.  She remained in decompensated heart failure and was  additionally diuresed.  At discharge she is doing much better.  Her weight is down, blood pressure is well controlled and her breathing is normal.  She denies any chest pain.  12/09/2019  Tracey Gallegos is seen today in follow-up.  She continues to do well.  She denies any worsening shortness of breath.  There is no chest pain.  The weight is up only a pound since November however she was at 264 pounds in June 2020.  She denies any new swelling.  She is compliant with the Entresto.  For now it is affordable for her.  I decreased her Lasix which she seems to be tolerating.  LVEF had normalized based on her last echo.  Today is also normal and blood pressure is much better controlled.  PMHx:  Past Medical History:  Diagnosis Date  . CHF (congestive heart failure) ([place])   . Coronary artery disease   . Diabetes mellitus without complication ([place])   . Dyspnea   . Hypertension     Past Surgical History:  Procedure Laterality Date  . CARDIAC CATHETERIZATION  06/28/2018  . CORONARY STENT INTERVENTION N/A 06/28/2018   Procedure: CORONARY STENT INTERVENTION;  Surgeon: Leonie Man, MD;  Location: [facility];  Service: Cardiovascular;  Laterality: N/A;  . CORONARY STENT PLACEMENT  06/28/2018  . RIGHT/LEFT HEART CATH AND CORONARY ANGIOGRAPHY N/A 06/28/2018   Procedure: RIGHT/LEFT HEART CATH AND CORONARY ANGIOGRAPHY;  Surgeon: Leonie Man, MD;  Location: [facility];  Service: Cardiovascular;  Laterality: N/A;  . TOTAL KNEE ARTHROPLASTY Left 05/2016  FAMHx:  Family History  Problem Relation Age of Onset  . Diabetes Mother   . Colon cancer Brother     SOCHx:   reports that she has never smoked. She has never used smokeless tobacco. She reports current alcohol use. She reports that she does not use drugs.  ALLERGIES:  Allergies  Allergen Reactions  . Lisinopril Cough    ROS: Pertinent items noted in HPI and remainder of comprehensive ROS otherwise negative.  HOME  MEDS: Current Outpatient Medications on File Prior to Visit  Medication Sig Dispense Refill  . amLODipine (NORVASC) 5 MG tablet Take 1 tablet (5 mg total) by mouth daily. Must have office visit for refills 30 tablet 0  . aspirin (ASPIRIN 81) 81 MG EC tablet Take 1 tablet (81 mg total) by mouth daily. 30 tablet 12  . atorvastatin (LIPITOR) 40 MG tablet TAKE 1 TABLET (40 MG TOTAL) BY MOUTH DAILY AT 6 PM. 30 tablet 0  . Blood Glucose Monitoring Suppl (CONTOUR NEXT MONITOR) w/Device KIT 1 kit by Does not apply route daily. Use to check blood sugar daily. E11.9 1 kit 0  . furosemide (LASIX) 40 MG tablet Take 1 tablet (40 mg total) by mouth daily. 90 tablet 3  . glipiZIDE (GLUCOTROL) 5 MG tablet TAKE 1 TABLET (5 MG TOTAL) BY MOUTH 2 (TWO) TIMES DAILY BEFORE A MEAL. 60 tablet 0  . glucose blood (CONTOUR NEXT TEST) test strip Use as instructed to check blood sugar daily. E11.9 100 each 11  . metoprolol succinate (TOPROL-XL) 50 MG 24 hr tablet TAKE 1 TABLET BY MOUTH ONCE A DAY 90 tablet 1  . Microlet Lancets MISC Use to check blood sugar daily. E11.9 100 each 11  . Omega-3 Fatty Acids (FISH OIL PO) Take 1,000 mg by mouth daily.     . potassium chloride (KLOR-CON) 10 MEQ tablet TAKE 1 TABLET (10 MEQ TOTAL) BY MOUTH 2 (TWO) TIMES DAILY. 60 tablet 0  . sacubitril-valsartan (ENTRESTO) 49-51 MG Take 1 tablet by mouth 2 (two) times daily. 60 tablet 11   No current facility-administered medications on file prior to visit.    LABS/IMAGING: No results found for this or any previous visit (from the past 48 hour(s)). No results found.  LIPID PANEL:    Component Value Date/Time   CHOL 171 01/17/2019 1342   TRIG 88 01/17/2019 1342   HDL 50 01/17/2019 1342   CHOLHDL 3.4 01/17/2019 1342   CHOLHDL 5.5 04/27/2018 1513   VLDL 17 04/27/2018 1513   LDLCALC 103 (H) 01/17/2019 1342     WEIGHTS: Wt Readings from Last 3 Encounters:  12/09/19 272 lb (123.4 kg)  05/31/19 260 lb (117.9 kg)  05/23/19 271 lb 12.8  oz (123.3 kg)    VITALS: BP 132/74   Pulse 65   Ht _0  (1.727 m)   Wt 272 lb (123.4 kg)   SpO2 98%   BMI 41.36 kg/m   EXAM: General appearance: alert, no distress and morbidly obese Neck: no carotid bruit, no JVD and thyroid not enlarged, symmetric, no tenderness/mass/nodules Lungs: clear to auscultation bilaterally Heart: regular rate and rhythm Abdomen: soft, non-tender; bowel sounds normal; no masses,  no organomegaly Extremities: extremities normal, atraumatic, no cyanosis or edema Pulses: 2+ and symmetric Skin: Skin color, texture, turgor normal. No rashes or lesions Neurologic: Grossly normal Psych: Pleasant  EKG: Normal sinus rhythm 64-personally reviewed  ASSESSMENT: 1. Coronary artery disease status post DES x2 to the first marginal and circumflex arteries (06/2018) 2. Acute  on chronic systolic congestive heart failure, LVEF 25 to 30% (04/2018) -> LVEF ~55% (04/2019) 3. NYHA class I symptoms 4. Morbid obesity 5. Poorly controlled hypertension 6. History of medication noncompliance - improved 7. Dyslipidemia  PLAN: 1.   Tracey Gallegos seems to be doing better with normalization of her LVEF by echo in October 2020.  She seems to be doing well on her medications.  Blood pressure is now much better controlled.  She needs a repeat lipid profile which we will order today.  She has follow-up with her PCP this summer.  No other changes to her medicines.  Plan follow-up annually or sooner as necessary.  Pixie Casino, MD, [facility], [place] Director of the Advanced Lipid Disorders &  Cardiovascular Risk Reduction Clinic Diplomate of the American Board of Clinical Lipidology Attending Cardiologist  Direct Dial: [phone]  Fax: [phone]  Website:  [email or web address] Aimar Borghi 12/09/2019, 1:44 PM

## 2019-12-09 NOTE — Patient Instructions (Signed)
Medication Instructions:  Your physician recommends that you continue on your current medications as directed. Please refer to the Current Medication list given to you today.  *If you need a refill on your cardiac medications before your next appointment, please call your pharmacy*   Lab Work: FASTING lipid panel   If you have labs (blood work) drawn today and your tests are completely normal, you will receive your results only by: . MyChart Message (if you have MyChart) OR . A paper copy in the mail If you have any lab test that is abnormal or we need to change your treatment, we will call you to review the results.   Testing/Procedures: NONE   Follow-Up: At [facility], you and your health needs are our priority.  As part of our continuing mission to provide you with exceptional heart care, we have created designated Provider Care Teams.  These Care Teams include your primary Cardiologist (physician) and Advanced Practice Providers (APPs -  Physician Assistants and Nurse Practitioners) who all work together to provide you with the care you need, when you need it.  We recommend signing up for the patient portal called "MyChart".  Sign up information is provided on this After Visit Summary.  MyChart is used to connect with patients for Virtual Visits (Telemedicine).  Patients are able to view lab/test results, encounter notes, upcoming appointments, etc.  Non-urgent messages can be sent to your provider as well.   To learn more about what you can do with MyChart, go to [email or web address].    Your next appointment:   12 month(s)  The format for your next appointment:   In Person  Provider:   You may see Kenneth C Hilty, MD or one of the following Advanced Practice Providers on your designated Care Team:    Hao Meng, PA-C  Angela Duke, PA-C or   Krista Kroeger, PA-C    Other Instructions   

## 2019-12-14 ENCOUNTER — Other Ambulatory Visit: Payer: Self-pay | Admitting: Internal Medicine

## 2019-12-14 MED FILL — CONTOUR NEXT STRIPS: 50 days supply | Qty: 50 | Fill #5

## 2019-12-14 MED FILL — METOPROLOL SUCCINATE ER 50: 50 | 30 days supply | Qty: 30 | Fill #0

## 2020-01-04 ENCOUNTER — Telehealth: Payer: Self-pay | Admitting: Internal Medicine

## 2020-01-06 ENCOUNTER — Ambulatory Visit: Payer: BLUE CROSS/BLUE SHIELD | Admitting: Internal Medicine

## 2020-01-06 NOTE — Telephone Encounter (Signed)
Contacted pt to go over [name] message pt didn't answer left a detailed vm informing pt of message and if she has any questions or concerns to give a call

## 2020-01-09 ENCOUNTER — Other Ambulatory Visit: Payer: Self-pay | Admitting: Internal Medicine

## 2020-01-09 DIAGNOSIS — I10 Essential (primary) hypertension: Secondary | ICD-10-CM

## 2020-01-31 ENCOUNTER — Encounter: Payer: Self-pay | Admitting: Internal Medicine

## 2020-01-31 ENCOUNTER — Ambulatory Visit: Payer: Medicare PPO | Attending: Internal Medicine | Admitting: Internal Medicine

## 2020-01-31 ENCOUNTER — Ambulatory Visit (HOSPITAL_BASED_OUTPATIENT_CLINIC_OR_DEPARTMENT_OTHER): Payer: Medicare PPO | Admitting: Pharmacist

## 2020-01-31 ENCOUNTER — Other Ambulatory Visit: Payer: Self-pay

## 2020-01-31 ENCOUNTER — Other Ambulatory Visit: Payer: Self-pay | Admitting: Internal Medicine

## 2020-01-31 VITALS — BP 179/100 | HR 60 | Temp 97.0°F | Resp 16 | Ht 69.0 in | Wt 285.6 lb

## 2020-01-31 DIAGNOSIS — Z23 Encounter for immunization: Secondary | ICD-10-CM

## 2020-01-31 DIAGNOSIS — I10 Essential (primary) hypertension: Secondary | ICD-10-CM | POA: Diagnosis not present

## 2020-01-31 DIAGNOSIS — Z6841 Body Mass Index (BMI) 40.0 and over, adult: Secondary | ICD-10-CM | POA: Diagnosis not present

## 2020-01-31 DIAGNOSIS — E1159 Type 2 diabetes mellitus with other circulatory complications: Secondary | ICD-10-CM | POA: Diagnosis not present

## 2020-01-31 DIAGNOSIS — I2510 Atherosclerotic heart disease of native coronary artery without angina pectoris: Secondary | ICD-10-CM

## 2020-01-31 DIAGNOSIS — I5042 Chronic combined systolic (congestive) and diastolic (congestive) heart failure: Secondary | ICD-10-CM | POA: Diagnosis not present

## 2020-01-31 LAB — POCT GLYCOSYLATED HEMOGLOBIN (HGB A1C): HbA1c, POC (prediabetic range): 6.3 % (ref 5.7–6.4)

## 2020-01-31 LAB — GLUCOSE, POCT (MANUAL RESULT ENTRY): POC Glucose: 94 mg/dl (ref 70–99)

## 2020-01-31 MED ORDER — LOSARTAN POTASSIUM 50 MG PO TABS: 50.0000 mg | ORAL_TABLET | Freq: Every day | ORAL | 3 refills | Status: DC

## 2020-01-31 MED ORDER — ATORVASTATIN CALCIUM 40 MG PO TABS: 40.0000 mg | ORAL_TABLET | Freq: Every day | ORAL | 6 refills | Status: DC

## 2020-01-31 MED ORDER — POTASSIUM CHLORIDE ER 10 MEQ PO TBCR: 10.0000 meq | EXTENDED_RELEASE_TABLET | Freq: Two times a day (BID) | ORAL | 6 refills | Status: DC

## 2020-01-31 MED ORDER — FUROSEMIDE 40 MG PO TABS: 40.0000 mg | ORAL_TABLET | Freq: Every day | ORAL | 3 refills | Status: DC

## 2020-01-31 MED ORDER — AMLODIPINE BESYLATE 5 MG PO TABS: 5.0000 mg | ORAL_TABLET | Freq: Every day | ORAL | 6 refills | Status: DC

## 2020-01-31 MED ORDER — ASPIRIN 81 MG PO TBEC: 81.0000 mg | DELAYED_RELEASE_TABLET | Freq: Every day | ORAL | 2 refills | Status: AC

## 2020-01-31 MED ORDER — METOPROLOL SUCCINATE ER 50 MG PO TB24: 50.0000 mg | ORAL_TABLET | Freq: Every day | ORAL | 6 refills | Status: DC

## 2020-01-31 MED ORDER — GLIPIZIDE 5 MG PO TABS: 5.0000 mg | ORAL_TABLET | Freq: Two times a day (BID) | ORAL | 6 refills | Status: DC

## 2020-01-31 MED FILL — LOSARTAN POTASSIUM 50 MG TA: 50 | 30 days supply | Qty: 30 | Fill #0

## 2020-01-31 MED FILL — ATORVASTATIN CALCIUM 40 MG: 40 | 30 days supply | Qty: 30 | Fill #0

## 2020-01-31 MED FILL — FUROSEMIDE 40 MG TAB: 40 | 90 days supply | Qty: 90 | Fill #0

## 2020-01-31 MED FILL — METOPROLOL SUCCINATE ER 50: 50 | 30 days supply | Qty: 30 | Fill #0

## 2020-01-31 MED FILL — glipiZIDE 5 MG TABS: 5 | 30 days supply | Qty: 60 | Fill #0

## 2020-01-31 MED FILL — AMLODIPINE BESYLATE 5 MG TA: 5 | 30 days supply | Qty: 30 | Fill #0

## 2020-01-31 NOTE — Patient Instructions (Addendum)
Take Losartan until you get Entresto then stop.  Pneumococcal Conjugate Vaccine (PCV13): What You Need to Know 1. Why get vaccinated? Pneumococcal conjugate vaccine (PCV13) can prevent pneumococcal disease. Pneumococcal disease refers to any illness caused by pneumococcal bacteria. These bacteria can cause many types of illnesses, including pneumonia, which is an infection of the lungs. Pneumococcal bacteria are one of the most common causes of pneumonia. Besides pneumonia, pneumococcal bacteria can also cause:  Ear infections  Sinus infections  Meningitis (infection of the tissue covering the brain and spinal cord)  Bacteremia (bloodstream infection) Anyone can get pneumococcal disease, but children under 45 years of age, people with certain medical conditions, adults 8 years or older, and cigarette smokers are at the highest risk. Most pneumococcal infections are mild. However, some can result in long-term problems, such as brain damage or hearing loss. Meningitis, bacteremia, and pneumonia caused by pneumococcal disease can be fatal. 2. PCV13 PCV13 protects against 13 types of bacteria that cause pneumococcal disease. Infants and young children usually need 4 doses of pneumococcal conjugate vaccine, at 2, 4, 6, and 23-74 months of age. In some cases, a child might need fewer than 4 doses to complete PCV13 vaccination. A dose of PCV23 vaccine is also recommended for anyone 2 years or older with certain medical conditions if they did not already receive PCV13. This vaccine may be given to adults 23 years or older based on discussions between the patient and health care provider. 3. Talk with your health care provider Tell your vaccine provider if the person getting the vaccine:  Has had an allergic reaction after a previous dose of PCV13, to an earlier pneumococcal conjugate vaccine known as PCV7, or to any vaccine containing diphtheria toxoid (for example, DTaP), or has any severe,  life-threatening allergies.  In some cases, your health care provider may decide to postpone PCV13 vaccination to a future visit. People with minor illnesses, such as a cold, may be vaccinated. People who are moderately or severely ill should usually wait until they recover before getting PCV13. Your health care provider can give you more information. 4. Risks of a vaccine reaction  Redness, swelling, pain, or tenderness where the shot is given, and fever, loss of appetite, fussiness (irritability), feeling tired, headache, and chills can happen after PCV13. Young children may be at increased risk for seizures caused by fever after PCV13 if it is administered at the same time as inactivated influenza vaccine. Ask your health care provider for more information. People sometimes faint after medical procedures, including vaccination. Tell your provider if you feel dizzy or have vision changes or ringing in the ears. As with any medicine, there is a very remote chance of a vaccine causing a severe allergic reaction, other serious injury, or death. 5. What if there is a serious problem? An allergic reaction could occur after the vaccinated person leaves the clinic. If you see signs of a severe allergic reaction (hives, swelling of the face and throat, difficulty breathing, a fast heartbeat, dizziness, or weakness), call 9-1-1 and get the person to the nearest hospital. For other signs that concern you, call your health care provider. Adverse reactions should be reported to the Vaccine Adverse Event Reporting System (VAERS). Your health care provider will usually file this report, or you can do it yourself. Visit the VAERS website at www.vaers.SamedayNews.es or call 531-879-7198. VAERS is only for reporting reactions, and VAERS staff do not give medical advice. 6. The National Vaccine Injury [employer]  Vaccine Injury Compensation Program (VICP) is a federal program that was created to  compensate people who may have been injured by certain vaccines. Visit the VICP website at GoldCloset.com.ee or call (236)764-4955 to learn about the program and about filing a claim. There is a time limit to file a claim for compensation. 7. How can I learn more?  Ask your health care provider.  Call your local or state health department.  Contact the Centers for Disease Control and Prevention (CDC): ? Call (919)456-1168 (1-800-CDC-INFO) or ? Visit CDC's website at http://hunter.com/ Vaccine Information Statement PCV13 Vaccine (05/19/2018) This information is not intended to replace advice given to you by your health care provider. Make sure you discuss any questions you have with your health care provider. Document Revised: 10/26/2018 Document Reviewed: 02/16/2018 Elsevier Patient Education  [place].

## 2020-01-31 NOTE — Progress Notes (Signed)
Patient presents for vaccination against strep pneumo per orders of Dr. Johnson. Consent given. Counseling provided. No contraindications exists. Vaccine administered without incident.   

## 2020-01-31 NOTE — Progress Notes (Signed)
Patient ID: Tracey Gallegos, female    DOB: Oct 17, 1955  MRN: [id number]  CC: Diabetes and Hypertension   Subjective: Tracey Gallegos is a 64 y.o. female who presents for chronic ds management Her concerns today include:  Patient with history of HTN, DM type II,CAD with DES to LT CXM, Diastolic and systolic CHFwith EF of 01-09%,NATFTDDU pulmonary hypertension on echo 04/2018, morbid obesity.  DIABETES TYPE 2/Obesity Last A1C:   Results for orders placed or performed in visit on 01/31/20  POCT glucose (manual entry)  Result Value Ref Range   POC Glucose 94 70 - 99 mg/dl  POCT glycosylated hemoglobin (Hb A1C)  Result Value Ref Range   Hemoglobin A1C     HbA1c POC (<> result, manual entry)     HbA1c, POC (prediabetic range) 6.3 5.7 - 6.4 %   HbA1c, POC (controlled diabetic range)      Med Adherence:  '[x]'$  Yes on Glucotrol but out of med x 2 wks   Medication side effects:  '[]'$  Yes    '[x]'$  No Home Monitoring?  '[x]'$  Yes once a day in mornings Home glucose results range:   130-145 Diet Adherence: was doing good except for past 2-3 wks.  Eating out during that time while on vacation. Gained 25 lbs since last visit 05/2019 Exercise: '[]'$  Yes    '[x]'$  No.  Used to walk consistently with a friend but her friend died earlier this year.  She has lacked motivation since then but plans to start going to the gym. Hypoglycemic episodes?: '[]'$  Yes    '[x]'$  No Numbness of the feet? '[]'$  Yes    '[]'$  No Retinopathy hx? '[]'$  Yes    '[x]'$  No Last eye exam:  Comments:  HYPERTENSION/CAD/Combined CHF Currently taking: see medication list Med Adherence: '[x]'$  Yes -but out of all meds for the past 2 weeks.  She also has to submit a form to the pharmaceutical company to be able to continue to get [place]. Medication side effects: '[]'$  Yes    '[x]'$  No Adherence with salt restriction: '[x]'$  Yes    '[]'$  No Home Monitoring?: '[]'$  Yes    '[]'$  No Monitoring Frequency: '[]'$  Yes    '[]'$  No Home BP results range: '[]'$  Yes    '[]'$  No SOB? '[]'$  Yes    '[x]'$   No Chest Pain?: '[]'$  Yes    '[x]'$  No Leg swelling?: '[x]'$  Yes    '[]'$  No Headaches?: '[]'$  Yes    '[x]'$  No Dizziness? '[]'$  Yes    '[x]'$  No Comments: saw Dr. Debara Pickett 11/2019.    Patient Active Problem List   Diagnosis Date Noted  . Dyslipidemia, goal LDL below 70 12/09/2019  . Papanicolaou smear of cervix with low risk human papillomavirus (HPV) DNA test positive 01/21/2019  . Influenza vaccination declined 07/20/2018  . Chronic combined systolic and diastolic CHF (congestive heart failure) ([place]) 07/20/2018  . Positive depression screening 07/20/2018  . CAD (coronary artery disease) 07/09/2018  . Hypertensive heart disease with acute on chronic systolic congestive heart failure ([place]) 06/24/2018  . Type 2 diabetes mellitus without complication, without long-term current use of insulin ([place]) 06/24/2018  . Morbid obesity ([place])      Current Outpatient Medications on File Prior to Visit  Medication Sig Dispense Refill  . Blood Glucose Monitoring Suppl (CONTOUR NEXT MONITOR) w/Device KIT 1 kit by Does not apply route daily. Use to check blood sugar daily. E11.9 1 kit 0  . glucose blood (CONTOUR NEXT TEST) test strip Use as instructed  to check blood sugar daily. E11.9 100 each 11  . Microlet Lancets MISC Use to check blood sugar daily. E11.9 100 each 11  . Omega-3 Fatty Acids (FISH OIL PO) Take 1,000 mg by mouth daily.     . sacubitril-valsartan (ENTRESTO) 49-51 MG Take 1 tablet by mouth 2 (two) times daily. 60 tablet 11   No current facility-administered medications on file prior to visit.    Allergies  Allergen Reactions  . Lisinopril Cough    Social History   Socioeconomic History  . Marital status: Single    Spouse name: Not on file  . Number of children: 0  . Years of education: Not on file  . Highest education level: Not on file  Occupational History  . Not on file  Tobacco Use  . Smoking status: Never Smoker  . Smokeless tobacco: Never Used  Vaping Use  . Vaping Use: Never used   Substance and Sexual Activity  . Alcohol use: Yes    Comment: occasionally  . Drug use: Never  . Sexual activity: Not Currently  Other Topics Concern  . Not on file  Social History Narrative  . Not on file   Social Determinants of Health   Financial Resource Strain:   . Difficulty of Paying Living Expenses:   Food Insecurity:   . Worried About Charity fundraiser in the Last Year:   . Arboriculturist in the Last Year:   Transportation Needs:   . Film/video editor (Medical):   Marland Kitchen Lack of Transportation (Non-Medical):   Physical Activity:   . Days of Exercise per Week:   . Minutes of Exercise per Session:   Stress:   . Feeling of Stress :   Social Connections:   . Frequency of Communication with Friends and Family:   . Frequency of Social Gatherings with Friends and Family:   . Attends Religious Services:   . Active Member of Clubs or Organizations:   . Attends Archivist Meetings:   Marland Kitchen Marital Status:   Intimate Partner Violence:   . Fear of Current or Ex-Partner:   . Emotionally Abused:   Marland Kitchen Physically Abused:   . Sexually Abused:     Family History  Problem Relation Age of Onset  . Diabetes Mother   . Colon cancer Brother     Past Surgical History:  Procedure Laterality Date  . CARDIAC CATHETERIZATION  06/28/2018  . CORONARY STENT INTERVENTION N/A 06/28/2018   Procedure: CORONARY STENT INTERVENTION;  Surgeon: Leonie Man, MD;  Location: [facility];  Service: Cardiovascular;  Laterality: N/A;  . CORONARY STENT PLACEMENT  06/28/2018  . RIGHT/LEFT HEART CATH AND CORONARY ANGIOGRAPHY N/A 06/28/2018   Procedure: RIGHT/LEFT HEART CATH AND CORONARY ANGIOGRAPHY;  Surgeon: Leonie Man, MD;  Location: [facility];  Service: Cardiovascular;  Laterality: N/A;  . TOTAL KNEE ARTHROPLASTY Left 05/2016    ROS: Review of Systems Negative except as stated above  PHYSICAL EXAM: BP (!) 179/100   Pulse 60   Temp (!) 97 F (36.1 C)    Resp 16   Ht '5\' 9"'$  (1.753 m)   Wt 285 lb 9.6 oz (129.5 kg)   SpO2 97%   BMI 42.18 kg/m   Wt Readings from Last 3 Encounters:  01/31/20 285 lb 9.6 oz (129.5 kg)  12/09/19 272 lb (123.4 kg)  05/31/19 260 lb (117.9 kg)    Physical Exam  General appearance - alert, well appearing, obese  older African-American female and in no distress Mental status - normal mood, behavior, speech, dress, motor activity, and thought processes Neck - supple, no significant adenopathy Chest - clear to auscultation, no wheezes, rales or rhonchi, symmetric air entry Heart - normal rate, regular rhythm, normal S1, S2, no murmurs, rubs, clicks or gallops Extremities - peripheral pulses normal, `1+ BL edema in lower legs Diabetic Foot Exam - Simple   Simple Foot Form Visual Inspection No deformities, no ulcerations, no other skin breakdown bilaterally: Yes Sensation Testing Intact to touch and monofilament testing bilaterally: Yes Pulse Check Posterior Tibialis and Dorsalis pulse intact bilaterally: Yes Comments      CMP Latest Ref Rng & Units 07/27/2018 07/09/2018 06/30/2018  Glucose 65 - 99 mg/dL 141(H) 144(H) 136(H)  BUN 8 - 27 mg/dL 20 22 26(H)  Creatinine 0.57 - 1.00 mg/dL 1.06(H) 1.08(H) 1.18(H)  Sodium 134 - 144 mmol/L 142 145(H) 140  Potassium 3.5 - 5.2 mmol/L 4.3 3.8 4.0  Chloride 96 - 106 mmol/L 102 105 103  CO2 20 - 29 mmol/L '22 21 27  '$ Calcium 8.7 - 10.3 mg/dL 9.5 9.7 9.5  Total Protein 6.0 - 8.5 g/dL 6.9 - -  Total Bilirubin 0.0 - 1.2 mg/dL 0.5 - -  Alkaline Phos 39 - 117 IU/L 128(H) - -  AST 0 - 40 IU/L 33 - -  ALT 0 - 32 IU/L 33(H) - -   Lipid Panel     Component Value Date/Time   CHOL 171 01/17/2019 1342   TRIG 88 01/17/2019 1342   HDL 50 01/17/2019 1342   CHOLHDL 3.4 01/17/2019 1342   CHOLHDL 5.5 04/27/2018 1513   VLDL 17 04/27/2018 1513   LDLCALC 103 (H) 01/17/2019 1342    CBC    Component Value Date/Time   WBC 5.0 01/17/2019 1342   WBC 5.4 06/30/2018 0401   RBC  5.18 01/17/2019 1342   RBC 5.42 (H) 06/30/2018 0401   HGB 14.6 01/17/2019 1342   HCT 45.3 01/17/2019 1342   PLT 222 01/17/2019 1342   MCV 88 01/17/2019 1342   MCH 28.2 01/17/2019 1342   MCH 24.7 (L) 06/30/2018 0401   MCHC 32.2 01/17/2019 1342   MCHC 30.3 06/30/2018 0401   RDW 13.5 01/17/2019 1342   LYMPHSABS 1.7 07/09/2018 0937   MONOABS 0.5 06/24/2018 1305   EOSABS 0.6 (H) 07/09/2018 0937   BASOSABS 0.1 07/09/2018 0937    ASSESSMENT AND PLAN: 1. Type 2 diabetes mellitus with other circulatory complication, without long-term current use of insulin (HCC) A1c at goal.  Refill given on Glucotrol. Dietary counseling given.  Patient plans to cook more of her own food now that she is back home. Exercise counseling given.  She plans to start going to the gym a few times a week.  Advised her to start low and go slow. - POCT glucose (manual entry) - POCT glycosylated hemoglobin (Hb A1C) - Microalbumin / creatinine urine ratio - CBC - Comprehensive metabolic panel - Lipid panel - glipiZIDE (GLUCOTROL) 5 MG tablet; Take 1 tablet (5 mg total) by mouth 2 (two) times daily before a meal.  Dispense: 60 tablet; Refill: 6  2. Essential hypertension Not at goal.  She has been out of medicines for the past 2 weeks.  Refill given on amlodipine and metoprolol.  Since it may take several more weeks for her to get Entresto through the patient assistance program, I have placed her on Cozaar with instructions to stop the Cozaar once she gets the  Entresto.  Verbal and written instructions given and patient expressed understanding. - amLODipine (NORVASC) 5 MG tablet; Take 1 tablet (5 mg total) by mouth daily. Must have office visit for refills  Dispense: 30 tablet; Refill: 6 - losartan (COZAAR) 50 MG tablet; Take 1 tablet (50 mg total) by mouth daily. Stop once you get Entresto  Dispense: 30 tablet; Refill: 3  3. Class 3 severe obesity due to excess calories with serious comorbidity and body mass index (BMI)  of 40.0 to 44.9 in adult [facility]) See #1 above  4. Chronic combined systolic and diastolic CHF (congestive heart failure) ([place]) She has some swelling in the lower extremities.  She has been out of her medicines for 2 weeks.  Refill given on furosemide and potassium - furosemide (LASIX) 40 MG tablet; Take 1 tablet (40 mg total) by mouth daily.  Dispense: 90 tablet; Refill: 3 - potassium chloride (KLOR-CON) 10 MEQ tablet; Take 1 tablet (10 mEq total) by mouth 2 (two) times daily.  Dispense: 180 tablet; Refill: 6  5. Coronary artery disease involving native coronary artery of native heart without angina pectoris Clinically stable. - metoprolol succinate (TOPROL-XL) 50 MG 24 hr tablet; Take 1 tablet (50 mg total) by mouth daily.  Dispense: 30 tablet; Refill: 6 - atorvastatin (LIPITOR) 40 MG tablet; Take 1 tablet (40 mg total) by mouth daily at 6 PM.  Dispense: 30 tablet; Refill: 6 - amLODipine (NORVASC) 5 MG tablet; Take 1 tablet (5 mg total) by mouth daily. Must have office visit for refills  Dispense: 30 tablet; Refill: 6  6. Need for vaccination against Streptococcus pneumoniae using pneumococcal conjugate vaccine 13 Given.     Patient was given the opportunity to ask questions.  Patient verbalized understanding of the plan and was able to repeat key elements of the plan.   Orders Placed This Encounter  Procedures  . Microalbumin / creatinine urine ratio  . CBC  . Comprehensive metabolic panel  . Lipid panel  . POCT glucose (manual entry)  . POCT glycosylated hemoglobin (Hb A1C)     Requested Prescriptions   Signed Prescriptions Disp Refills  . metoprolol succinate (TOPROL-XL) 50 MG 24 hr tablet 30 tablet 6    Sig: Take 1 tablet (50 mg total) by mouth daily.  . furosemide (LASIX) 40 MG tablet 90 tablet 3    Sig: Take 1 tablet (40 mg total) by mouth daily.  Marland Kitchen atorvastatin (LIPITOR) 40 MG tablet 30 tablet 6    Sig: Take 1 tablet (40 mg total) by mouth daily at 6 PM.  . glipiZIDE  (GLUCOTROL) 5 MG tablet 60 tablet 6    Sig: Take 1 tablet (5 mg total) by mouth 2 (two) times daily before a meal.  . amLODipine (NORVASC) 5 MG tablet 30 tablet 6    Sig: Take 1 tablet (5 mg total) by mouth daily. Must have office visit for refills  . aspirin (ASPIRIN 81) 81 MG EC tablet 100 tablet 2    Sig: Take 1 tablet (81 mg total) by mouth daily.  Marland Kitchen losartan (COZAAR) 50 MG tablet 30 tablet 3    Sig: Take 1 tablet (50 mg total) by mouth daily. Stop once you get Entresto  . potassium chloride (KLOR-CON) 10 MEQ tablet 180 tablet 6    Sig: Take 1 tablet (10 mEq total) by mouth 2 (two) times daily.    Return in about 4 months (around 06/02/2020) for [facility] in 1 mth for BP check.  Karle Plumber, MD,  FACP

## 2020-02-01 LAB — COMPREHENSIVE METABOLIC PANEL
ALT: 14 IU/L (ref 0–32)
AST: 20 IU/L (ref 0–40)
Albumin/Globulin Ratio: 1.6 (ref 1.2–2.2)
Albumin: 4.6 g/dL (ref 3.8–4.8)
Alkaline Phosphatase: 122 IU/L — ABNORMAL HIGH (ref 48–121)
BUN/Creatinine Ratio: 14 (ref 12–28)
BUN: 16 mg/dL (ref 8–27)
Bilirubin Total: 0.7 mg/dL (ref 0.0–1.2)
CO2: 27 mmol/L (ref 20–29)
Calcium: 9.8 mg/dL (ref 8.7–10.3)
Chloride: 103 mmol/L (ref 96–106)
Creatinine, Ser: 1.11 mg/dL — ABNORMAL HIGH (ref 0.57–1.00)
GFR calc Af Amer: 60 mL/min/{1.73_m2} (ref >59)
GFR calc non Af Amer: 52 mL/min/{1.73_m2} — ABNORMAL LOW (ref >59)
Globulin, Total: 2.8 g/dL (ref 1.5–4.5)
Glucose: 88 mg/dL (ref 65–99)
Potassium: 4.4 mmol/L (ref 3.5–5.2)
Sodium: 142 mmol/L (ref 134–144)
Total Protein: 7.4 g/dL (ref 6.0–8.5)

## 2020-02-01 LAB — CBC
Hematocrit: 45.9 % (ref 34.0–46.6)
Hemoglobin: 14.2 g/dL (ref 11.1–15.9)
MCH: 26.8 pg (ref 26.6–33.0)
MCHC: 30.9 g/dL — ABNORMAL LOW (ref 31.5–35.7)
MCV: 87 fL (ref 79–97)
Platelets: 182 10*3/uL (ref 150–450)
RBC: 5.3 x10E6/uL — ABNORMAL HIGH (ref 3.77–5.28)
RDW: 14.2 % (ref 11.7–15.4)
WBC: 5.6 10*3/uL (ref 3.4–10.8)

## 2020-02-01 LAB — MICROALBUMIN / CREATININE URINE RATIO
Creatinine, Urine: 250.8 mg/dL
Microalb/Creat Ratio: 5 mg/g creat (ref 0–29)
Microalbumin, Urine: 11.9 ug/mL

## 2020-02-01 LAB — LIPID PANEL
Chol/HDL Ratio: 4 ratio (ref 0.0–4.4)
Cholesterol, Total: 230 mg/dL — ABNORMAL HIGH (ref 100–199)
HDL: 57 mg/dL (ref >39)
LDL Chol Calc (NIH): 157 mg/dL — ABNORMAL HIGH (ref 0–99)
Triglycerides: 90 mg/dL (ref 0–149)
VLDL Cholesterol Cal: 16 mg/dL (ref 5–40)

## 2020-02-28 ENCOUNTER — Other Ambulatory Visit: Payer: Self-pay | Admitting: Internal Medicine

## 2020-02-28 DIAGNOSIS — E119 Type 2 diabetes mellitus without complications: Secondary | ICD-10-CM

## 2020-02-28 MED FILL — AMLODIPINE BESYLATE 5 MG TA: 5 | 30 days supply | Qty: 30 | Fill #1

## 2020-02-28 MED FILL — METOPROLOL SUCCINATE ER 50: 50 | 30 days supply | Qty: 30 | Fill #1

## 2020-02-28 MED FILL — ATORVASTATIN CALCIUM 40 MG: 40 | 30 days supply | Qty: 30 | Fill #1

## 2020-02-28 MED FILL — glipiZIDE 5 MG TABS: 5 | 30 days supply | Qty: 60 | Fill #1

## 2020-02-28 MED FILL — LOSARTAN POTASSIUM 50 MG TA: 50 | 30 days supply | Qty: 30 | Fill #1

## 2020-03-06 ENCOUNTER — Ambulatory Visit: Payer: Medicare PPO | Attending: Internal Medicine | Admitting: Pharmacist

## 2020-03-06 ENCOUNTER — Other Ambulatory Visit: Payer: Self-pay

## 2020-03-06 ENCOUNTER — Encounter: Payer: Self-pay | Admitting: Pharmacist

## 2020-03-06 VITALS — BP 141/76

## 2020-03-06 DIAGNOSIS — I10 Essential (primary) hypertension: Secondary | ICD-10-CM | POA: Diagnosis not present

## 2020-03-06 MED ORDER — LOSARTAN POTASSIUM 100 MG PO TABS: 100.0000 mg | ORAL_TABLET | Freq: Every day | ORAL | 2 refills | Status: DC

## 2020-03-06 MED FILL — LOSARTAN POTASSIUM 100 MG T: 100 | 30 days supply | Qty: 30 | Fill #0

## 2020-03-06 NOTE — Progress Notes (Signed)
   S:    Patient arrives well and in good spirits.    Presents to the clinic for hypertension evaluation, counseling, and management.  Patient was referred and last seen by Primary Care Provider on 01/30/2020. BP 179/100 at that visit - [name] started losartan.   Medication adherence reported. Still does not have Entresto.   Current BP Medications include:  Amlodipine 5 mg daily, furosemide 40 mg daily Losartan 50 mg daily, metoprolol succinate 50 mg daily  Associated comorbidities: T2DM (controlled), CAD, diastolic and systolic CHF (ECHO 24-58%)  Dietary habits include: "up and down"; compliant with salt restriction Exercise habits include: 1 hour/day x6 days of the week Family history: diabetes (mother)  Tobacco: never   O:  Vitals:   03/06/20 1517  BP: (!) 141/76   Home BP readings: None; patient does not have home blood pressure cuff  Last 3 Office BP readings: BP Readings from Last 3 Encounters:  03/06/20 (!) 141/76  01/31/20 (!) 179/100  12/09/19 132/74    BMET    Component Value Date/Time   NA 142 01/31/2020 1620   K 4.4 01/31/2020 1620   CL 103 01/31/2020 1620   CO2 27 01/31/2020 1620   GLUCOSE 88 01/31/2020 1620   GLUCOSE 136 (H) 06/30/2018 0401   BUN 16 01/31/2020 1620   CREATININE 1.11 (H) 01/31/2020 1620   CALCIUM 9.8 01/31/2020 1620   GFRNONAA 52 (L) 01/31/2020 1620   GFRAA 60 01/31/2020 1620    Renal function: CrCl cannot be calculated (Patient's most recent lab result is older than the maximum 21 days allowed.).  Clinical ASCVD: Yes  - CAD Compelling indications: CHF, CAD, T2DM   A/P: Hypertension is currently uncontrolled on current medications. BP Goal = < 130/80 mmHg. Medication adherence reported.   -BP improved today, although still not at goal (141/76 mmHg) -Patient still waiting for Entresto patient assistance to be approved -Increase losartan to 100 mg daily. Continue with plan to discontinue losartan once patient receives  Entresto. -Counseled on lifestyle modifications for blood pressure control including reduced dietary sodium, increased exercise, adequate sleep.  Results reviewed and written information provided.   Total time in face-to-face counseling 15 minutes.   F/U Clinic Visit with Pharmacy in 1 month.    Harriet Pho, PharmD PGY-1 Community Pharmacy Resident   03/06/2020 3:56 PM   Benard Halsted, PharmD, [place] [phone]

## 2020-03-20 ENCOUNTER — Other Ambulatory Visit: Payer: Self-pay | Admitting: Pharmacist

## 2020-03-20 DIAGNOSIS — E1159 Type 2 diabetes mellitus with other circulatory complications: Secondary | ICD-10-CM

## 2020-03-20 MED ORDER — ONETOUCH VERIO VI STRP: ORAL_STRIP | 12 refills | Status: DC

## 2020-03-20 MED ORDER — ACCU-CHEK SOFTCLIX LANCETS MISC: 2 refills | Status: DC

## 2020-03-20 MED ORDER — ACCU-CHEK GUIDE ME W/DEVICE KIT: PACK | 0 refills | Status: DC

## 2020-03-20 MED ORDER — ACCU-CHEK GUIDE VI STRP: ORAL_STRIP | 2 refills | Status: DC

## 2020-03-21 MED FILL — ACCU-CHEK GUIDE TEST STRIP: 90 days supply | Qty: 100 | Fill #0

## 2020-03-21 MED FILL — ACCU-CHEK SOFTCLIX LANCETS: 90 days supply | Qty: 100 | Fill #0

## 2020-03-21 MED FILL — ACCU-CHEK GUIDE ME W/DEVICE: W/DEVICE | 1 days supply | Qty: 1 | Fill #0

## 2020-03-30 MED FILL — ATORVASTATIN CALCIUM 40 MG: 40 | 30 days supply | Qty: 30 | Fill #2

## 2020-03-30 MED FILL — METOPROLOL SUCCINATE ER 50: 50 | 30 days supply | Qty: 30 | Fill #2

## 2020-03-30 MED FILL — AMLODIPINE BESYLATE 5 MG TA: 5 | 30 days supply | Qty: 30 | Fill #2

## 2020-03-30 MED FILL — glipiZIDE 5 MG TABS: 5 | 30 days supply | Qty: 60 | Fill #2

## 2020-04-03 ENCOUNTER — Encounter: Payer: Self-pay | Admitting: Pharmacist

## 2020-04-03 ENCOUNTER — Other Ambulatory Visit: Payer: Self-pay

## 2020-04-03 ENCOUNTER — Ambulatory Visit: Payer: Medicare PPO | Attending: Internal Medicine | Admitting: Pharmacist

## 2020-04-03 VITALS — BP 137/82 | HR 67

## 2020-04-03 DIAGNOSIS — I10 Essential (primary) hypertension: Secondary | ICD-10-CM | POA: Diagnosis not present

## 2020-04-03 NOTE — Progress Notes (Signed)
   S:    Patient arrives well and in good spirits.    Presents to the clinic for hypertension evaluation, counseling, and management.  Patient was referred and last seen by Primary Care Provider on 01/31/2020.   Last seen by Clinical Pharmacist on 03/06/20. Patient had not received Entresto yet, so losartan was increased to 100 mg daily.   Today's visit pt reports receiving Entresto last week with first dose on 03/29/20. Pt confirmed stopping losartan therapy.   Medication adherence reported.   Current BP Medications include:  Amlodipine 5 mg daily, furosemide 40 mg daily, Entresto 49-51 mg BID, metoprolol succinate 50 mg daily  Associated comorbidities: T2DM (controlled), CAD, diastolic and systolic CHF (ECHO 83-09%)  Dietary habits include: "up and down"; compliant with salt restriction, limiting eating out Exercise habits include: 1 hour/day x6 days of the week Family history: diabetes (mother)  Tobacco: never   O:  Vitals:   04/03/20 1511  BP: 137/82  Pulse: 67   Home BP readings: None; patient does not have home blood pressure cuff  Last 3 Office BP readings: BP Readings from Last 3 Encounters:  04/03/20 137/82  03/06/20 (!) 141/76  01/31/20 (!) 179/100    BMET    Component Value Date/Time   NA 142 01/31/2020 1620   K 4.4 01/31/2020 1620   CL 103 01/31/2020 1620   CO2 27 01/31/2020 1620   GLUCOSE 88 01/31/2020 1620   GLUCOSE 136 (H) 06/30/2018 0401   BUN 16 01/31/2020 1620   CREATININE 1.11 (H) 01/31/2020 1620   CALCIUM 9.8 01/31/2020 1620   GFRNONAA 52 (L) 01/31/2020 1620   GFRAA 60 01/31/2020 1620    Renal function: CrCl cannot be calculated (Patient's most recent lab result is older than the maximum 21 days allowed.).  Clinical ASCVD: Yes  - CAD Compelling indications: CHF, CAD, T2DM   A/P: Hypertension is currently close to goal on current medications. BP Goal = < 130/80 mmHg. Medication adherence reported. Patient has now received Entresto from  patient assistance program.  -Continue Entresto 49-51 mg BID  -Stop losartan 100 mg  -Continue other anti-hypertensives -Counseled on lifestyle modifications for blood pressure control including reduced dietary sodium, increased exercise, adequate sleep.  Results reviewed and written information provided.   Total time in face-to-face counseling 10 minutes.   F/U appointment with [name] on 06/05/20.  Harriet Pho, PharmD PGY-1 Community Pharmacy Resident  04/03/2020 3:26 PM  Benard Halsted, PharmD, [place] [phone]

## 2020-04-25 MED FILL — glipiZIDE 5 MG TABS: 5 | 30 days supply | Qty: 60 | Fill #3

## 2020-04-25 MED FILL — AMLODIPINE BESYLATE 5 MG TA: 5 | 30 days supply | Qty: 30 | Fill #3

## 2020-04-25 MED FILL — METOPROLOL SUCCINATE ER 50: 50 | 30 days supply | Qty: 30 | Fill #3

## 2020-05-10 ENCOUNTER — Other Ambulatory Visit: Payer: Self-pay | Admitting: Internal Medicine

## 2020-05-10 DIAGNOSIS — Z1231 Encounter for screening mammogram for malignant neoplasm of breast: Secondary | ICD-10-CM

## 2020-05-10 MED FILL — ATORVASTATIN CALCIUM 40 MG: 40 | 30 days supply | Qty: 30 | Fill #3

## 2020-05-10 MED FILL — FUROSEMIDE 40 MG TAB: 40 | 90 days supply | Qty: 90 | Fill #1

## 2020-05-11 ENCOUNTER — Other Ambulatory Visit: Payer: Self-pay

## 2020-05-11 ENCOUNTER — Ambulatory Visit
Admission: RE | Admit: 2020-05-11 | Discharge: 2020-05-11 | Disposition: A | Discharge status: Home / Self Care | Payer: Medicare PPO | Source: Ambulatory Visit | Attending: Internal Medicine | Admitting: Internal Medicine

## 2020-05-11 DIAGNOSIS — Z1231 Encounter for screening mammogram for malignant neoplasm of breast: Secondary | ICD-10-CM

## 2020-05-28 MED FILL — glipiZIDE 5 MG TABS: 5 | 30 days supply | Qty: 60 | Fill #4

## 2020-05-28 MED FILL — METOPROLOL SUCCINATE ER 50: 50 | 30 days supply | Qty: 30 | Fill #4

## 2020-05-28 MED FILL — AMLODIPINE BESYLATE 5 MG TA: 5 | 30 days supply | Qty: 30 | Fill #4

## 2020-06-05 ENCOUNTER — Ambulatory Visit: Payer: Medicare PPO | Attending: Internal Medicine | Admitting: Internal Medicine

## 2020-06-05 ENCOUNTER — Other Ambulatory Visit: Payer: Self-pay

## 2020-06-05 DIAGNOSIS — I5042 Chronic combined systolic (congestive) and diastolic (congestive) heart failure: Secondary | ICD-10-CM | POA: Diagnosis not present

## 2020-06-05 DIAGNOSIS — Z2821 Immunization not carried out because of patient refusal: Secondary | ICD-10-CM | POA: Diagnosis not present

## 2020-06-05 DIAGNOSIS — I2510 Atherosclerotic heart disease of native coronary artery without angina pectoris: Secondary | ICD-10-CM | POA: Diagnosis not present

## 2020-06-05 DIAGNOSIS — E1159 Type 2 diabetes mellitus with other circulatory complications: Secondary | ICD-10-CM

## 2020-06-05 DIAGNOSIS — I10 Essential (primary) hypertension: Secondary | ICD-10-CM

## 2020-06-05 NOTE — Progress Notes (Signed)
Virtual Visit via Telephone Note Due to current restrictions/limitations of in-office visits due to the COVID-19 pandemic, this scheduled clinical appointment was converted to a telehealth visit  I connected with Tracey Gallegos on 06/05/20 at 4:40 p.m by telephone and verified that I am speaking with the correct person using two identifiers.  Location: Patient: home Provider: office   I discussed the limitations, risks, security and privacy concerns of performing an evaluation and management service by telephone and the availability of in person appointments. I also discussed with the patient that there may be a patient responsible charge related to this service. The patient expressed understanding and agreed to proceed.   History of Present Illness: Patient with history of HTN, DM type II,CAD with DES to LT CXM, Diastolic and systolic CHFwith EF of 28-41%,[id number] pulmonary hypertension on echo 04/2018, morbid obesity. Last eval 01/2020.  Today's visit is for chronic ds management.  HYPERTENSION/CAD/CHF Currently taking: see medication list Med Adherence: [x]  Yes-since last visit with me she did get the [facility] and is taking with amlodipine and metoprolol.  She has seen the clinical pharmacist a few times since last visit with me.  On last visit systolic blood pressure was in the 130s but much improved compared to previous visits. Medication side effects: []  Yes    [x]  No Adherence with salt restriction: [x]  Yes    []  No Home Monitoring?: no device Monitoring Frequency: []  Yes    []  No Home BP results range: []  Yes    []  No SOB? []  Yes    [x]  No Chest Pain?: []  Yes    [x]  No Leg swelling?: []  Yes    [x]  No Headaches?: []  Yes    [x]  No Dizziness? []  Yes    [x]  No Comments:   DIABETES TYPE 2 Last A1C:   Lab Results  Component Value Date   HGBA1C 6.3 01/31/2020   Med Adherence:  [x]  Yes    []  No Medication side effects:  []  Yes    [x]  No Home Monitoring?  [x]  Yes  -before BF  every morning Home glucose results range: 102-130s Diet Adherence:  "Not the greatest but not bad."  Not eating large portions but not moving as much.  But now has a gym member. Admits that she has not been going regularly over past 2 mths.  Last motivation since her friend died.  "She was the only person I knew here."  She started going to church to meet people Exercise: []  Yes    [x]  No   Hypoglycemic episodes?: []  Yes    [x]  No Numbness of the feet? []  Yes    [x]  No Retinopathy hx? []  Yes    []  No Last eye exam:  Due for eye exam Comments:   HM: declines flu shot.  Due for repeat Pap smear.  Last Pap was done a year ago with positive HPV but negative subtypes. Outpatient Encounter Medications as of 06/05/2020  Medication Sig  . Accu-Chek Softclix Lancets lancets Use to check blood sugar daily. E11.9  . amLODipine (NORVASC) 5 MG tablet Take 1 tablet (5 mg total) by mouth daily. Must have office visit for refills  . aspirin (ASPIRIN 81) 81 MG EC tablet Take 1 tablet (81 mg total) by mouth daily.  Marland Kitchen atorvastatin (LIPITOR) 40 MG tablet Take 1 tablet (40 mg total) by mouth daily at 6 PM.  . Blood Glucose Monitoring Suppl (ACCU-CHEK GUIDE ME) w/Device KIT Use to check blood sugar daily. E11.9  .  furosemide (LASIX) 40 MG tablet Take 1 tablet (40 mg total) by mouth daily.  Marland Kitchen glipiZIDE (GLUCOTROL) 5 MG tablet Take 1 tablet (5 mg total) by mouth 2 (two) times daily before a meal.  . glucose blood (ACCU-CHEK GUIDE) test strip Use to check blood sugar daily. E11.9  . metoprolol succinate (TOPROL-XL) 50 MG 24 hr tablet Take 1 tablet (50 mg total) by mouth daily.  . Omega-3 Fatty Acids (FISH OIL PO) Take 1,000 mg by mouth daily.   . potassium chloride (KLOR-CON) 10 MEQ tablet Take 1 tablet (10 mEq total) by mouth 2 (two) times daily.  . sacubitril-valsartan (ENTRESTO) 49-51 MG Take 1 tablet by mouth 2 (two) times daily.   No facility-administered encounter medications on file as of 06/05/2020.        Observations/Objective: Results for orders placed or performed in visit on 01/31/20  Microalbumin / creatinine urine ratio  Result Value Ref Range   Creatinine, Urine 250.8 Not Estab. mg/dL   Microalbumin, Urine 11.9 Not Estab. ug/mL   Microalb/Creat Ratio 5 0 - 29 mg/g creat  CBC  Result Value Ref Range   WBC 5.6 3.4 - 10.8 x10E3/uL   RBC 5.30 (H) 3.77 - 5.28 x10E6/uL   Hemoglobin 14.2 11.1 - 15.9 g/dL   Hematocrit 45.9 34.0 - 46.6 %   MCV 87 79 - 97 fL   MCH 26.8 26.6 - 33.0 pg   MCHC 30.9 (L) 31 - 35 g/dL   RDW 14.2 11.7 - 15.4 %   Platelets 182 150 - 450 x10E3/uL  Comprehensive metabolic panel  Result Value Ref Range   Glucose 88 65 - 99 mg/dL   BUN 16 8 - 27 mg/dL   Creatinine, Ser 1.11 (H) 0.57 - 1.00 mg/dL   GFR calc non Af Amer 52 (L) >59 mL/min/1.73   GFR calc Af Amer 60 >59 mL/min/1.73   BUN/Creatinine Ratio 14 12 - 28   Sodium 142 134 - 144 mmol/L   Potassium 4.4 3.5 - 5.2 mmol/L   Chloride 103 96 - 106 mmol/L   CO2 27 20 - 29 mmol/L   Calcium 9.8 8.7 - 10.3 mg/dL   Total Protein 7.4 6.0 - 8.5 g/dL   Albumin 4.6 3.8 - 4.8 g/dL   Globulin, Total 2.8 1.5 - 4.5 g/dL   Albumin/Globulin Ratio 1.6 1.2 - 2.2   Bilirubin Total 0.7 0.0 - 1.2 mg/dL   Alkaline Phosphatase 122 (H) 48 - 121 IU/L   AST 20 0 - 40 IU/L   ALT 14 0 - 32 IU/L  Lipid panel  Result Value Ref Range   Cholesterol, Total 230 (H) 100 - 199 mg/dL   Triglycerides 90 0 - 149 mg/dL   HDL 57 >39 mg/dL   VLDL Cholesterol Cal 16 5 - 40 mg/dL   LDL Chol Calc (NIH) 157 (H) 0 - 99 mg/dL   Chol/HDL Ratio 4.0 0.0 - 4.4 ratio  POCT glucose (manual entry)  Result Value Ref Range   POC Glucose 94 70 - 99 mg/dl  POCT glycosylated hemoglobin (Hb A1C)  Result Value Ref Range   Hemoglobin A1C     HbA1c POC (<> result, manual entry)     HbA1c, POC (prediabetic range) 6.3 5.7 - 6.4 %   HbA1c, POC (controlled diabetic range)       Assessment and Plan: 1. Essential hypertension Much improved.  She  will continue current medications and low-salt diet.  2. Type 2 diabetes mellitus with other circulatory complication,  without long-term current use of insulin ([place]) Reported blood sugars are at goal.  Last A1c was good.  Continue glipizide.  Encourage healthy eating habits.  Encouraged her to try to move more. - Ambulatory referral to Ophthalmology  3. CAD in native artery 4. Systolic and diastolic CHF, chronic (HCC) Stable.  Continue furosemide, metoprolol and Entresto.  5. Influenza vaccination declined Offered and recommended.  Patient declined.   Follow Up Instructions: 1 month for repeat Pap   I discussed the assessment and treatment plan with the patient. The patient was provided an opportunity to ask questions and all were answered. The patient agreed with the plan and demonstrated an understanding of the instructions.   The patient was advised to call back or seek an in-person evaluation if the symptoms worsen or if the condition fails to improve as anticipated.  I provided 13 minutes of non-face-to-face time during this encounter.   Karle Plumber, MD

## 2020-06-06 MED FILL — ATORVASTATIN CALCIUM 40 MG: 40 | 30 days supply | Qty: 30 | Fill #4

## 2020-06-09 ENCOUNTER — Encounter (HOSPITAL_COMMUNITY): Payer: Self-pay | Admitting: *Deleted

## 2020-06-09 ENCOUNTER — Emergency Department (HOSPITAL_COMMUNITY): Payer: Medicare PPO

## 2020-06-09 ENCOUNTER — Emergency Department (HOSPITAL_COMMUNITY)
Admission: EM | Admit: 2020-06-09 | Discharge: 2020-06-10 | Disposition: A | Discharge status: Home / Self Care | Payer: Medicare PPO | Attending: Emergency Medicine | Admitting: Emergency Medicine

## 2020-06-09 ENCOUNTER — Other Ambulatory Visit: Payer: Self-pay

## 2020-06-09 DIAGNOSIS — Z955 Presence of coronary angioplasty implant and graft: Secondary | ICD-10-CM | POA: Diagnosis not present

## 2020-06-09 DIAGNOSIS — S8991XA Unspecified injury of right lower leg, initial encounter: Secondary | ICD-10-CM | POA: Insufficient documentation

## 2020-06-09 DIAGNOSIS — W19XXXA Unspecified fall, initial encounter: Secondary | ICD-10-CM | POA: Diagnosis not present

## 2020-06-09 DIAGNOSIS — I110 Hypertensive heart disease with heart failure: Secondary | ICD-10-CM | POA: Insufficient documentation

## 2020-06-09 DIAGNOSIS — R609 Edema, unspecified: Secondary | ICD-10-CM | POA: Diagnosis not present

## 2020-06-09 DIAGNOSIS — S80919A Unspecified superficial injury of unspecified knee, initial encounter: Secondary | ICD-10-CM | POA: Diagnosis not present

## 2020-06-09 DIAGNOSIS — M1711 Unilateral primary osteoarthritis, right knee: Secondary | ICD-10-CM | POA: Insufficient documentation

## 2020-06-09 DIAGNOSIS — I2510 Atherosclerotic heart disease of native coronary artery without angina pectoris: Secondary | ICD-10-CM | POA: Diagnosis not present

## 2020-06-09 DIAGNOSIS — R52 Pain, unspecified: Secondary | ICD-10-CM | POA: Diagnosis not present

## 2020-06-09 DIAGNOSIS — M25561 Pain in right knee: Secondary | ICD-10-CM | POA: Diagnosis not present

## 2020-06-09 DIAGNOSIS — M25461 Effusion, right knee: Secondary | ICD-10-CM | POA: Insufficient documentation

## 2020-06-09 DIAGNOSIS — Z79899 Other long term (current) drug therapy: Secondary | ICD-10-CM | POA: Diagnosis not present

## 2020-06-09 DIAGNOSIS — I5022 Chronic systolic (congestive) heart failure: Secondary | ICD-10-CM | POA: Diagnosis not present

## 2020-06-09 DIAGNOSIS — M7989 Other specified soft tissue disorders: Secondary | ICD-10-CM | POA: Diagnosis not present

## 2020-06-09 DIAGNOSIS — Y93E5 Activity, floor mopping and cleaning: Secondary | ICD-10-CM | POA: Diagnosis not present

## 2020-06-09 DIAGNOSIS — W010XXA Fall on same level from slipping, tripping and stumbling without subsequent striking against object, initial encounter: Secondary | ICD-10-CM | POA: Insufficient documentation

## 2020-06-09 DIAGNOSIS — R600 Localized edema: Secondary | ICD-10-CM | POA: Diagnosis not present

## 2020-06-09 DIAGNOSIS — Z7982 Long term (current) use of aspirin: Secondary | ICD-10-CM | POA: Insufficient documentation

## 2020-06-09 DIAGNOSIS — E119 Type 2 diabetes mellitus without complications: Secondary | ICD-10-CM | POA: Diagnosis not present

## 2020-06-09 MED ORDER — IBUPROFEN 600 MG PO TABS: 600.0000 mg | ORAL_TABLET | Freq: Four times a day (QID) | ORAL | 0 refills | Status: DC | PRN

## 2020-06-09 NOTE — ED Notes (Signed)
Paged ortho and waiting on him

## 2020-06-09 NOTE — ED Triage Notes (Signed)
The pt is c/o rt knee pain after she tripped and fell onto the ground  She is having difficulty  Walking since

## 2020-06-09 NOTE — ED Provider Notes (Signed)
[facility] EMERGENCY DEPARTMENT Provider Note   CSN: [id number] Arrival date & time: 06/09/20  1911     History   Chief Complaint Chief Complaint  Patient presents with  . Knee Pain    HPI Tracey Gallegos is a 64 y.o. female, who presents to the ED with a chief complaint of constant, moderate, non-radiating right knee pain after she slipped and fell while mopping earlier today.  She reports that she twisted her knee.  She has been having pain ever since.  No successful treatments PTA.  She denies numbness, weakness or tingling. She denies any other injuries.       HPI  Past Medical History:  Diagnosis Date  . CHF (congestive heart failure) ([place])   . Coronary artery disease   . Diabetes mellitus without complication ([place])   . Dyspnea   . Hypertension     Patient Active Problem List   Diagnosis Date Noted  . Dyslipidemia, goal LDL below 70 12/09/2019  . Papanicolaou smear of cervix with low risk human papillomavirus (HPV) DNA test positive 01/21/2019  . Influenza vaccination declined 07/20/2018  . Chronic combined systolic and diastolic CHF (congestive heart failure) ([place]) 07/20/2018  . Positive depression screening 07/20/2018  . CAD (coronary artery disease) 07/09/2018  . Hypertensive heart disease with acute on chronic systolic congestive heart failure ([place]) 06/24/2018  . Type 2 diabetes mellitus without complication, without long-term current use of insulin ([place]) 06/24/2018  . Morbid obesity ([place])     Past Surgical History:  Procedure Laterality Date  . CARDIAC CATHETERIZATION  06/28/2018  . CORONARY STENT INTERVENTION N/A 06/28/2018   Procedure: CORONARY STENT INTERVENTION;  Surgeon: Leonie Man, MD;  Location: [facility];  Service: Cardiovascular;  Laterality: N/A;  . CORONARY STENT PLACEMENT  06/28/2018  . RIGHT/LEFT HEART CATH AND CORONARY ANGIOGRAPHY N/A 06/28/2018   Procedure: RIGHT/LEFT HEART CATH AND CORONARY ANGIOGRAPHY;   Surgeon: Leonie Man, MD;  Location: [facility];  Service: Cardiovascular;  Laterality: N/A;  . TOTAL KNEE ARTHROPLASTY Left 05/2016     OB History   No obstetric history on file.      Home Medications    Prior to Admission medications   Medication Sig Start Date End Date Taking? Authorizing Provider  Accu-Chek Softclix Lancets lancets Use to check blood sugar daily. E11.9 03/20/20   Ladell Pier, MD  amLODipine (NORVASC) 5 MG tablet Take 1 tablet (5 mg total) by mouth daily. Must have office visit for refills 01/31/20   Ladell Pier, MD  aspirin (ASPIRIN 81) 81 MG EC tablet Take 1 tablet (81 mg total) by mouth daily. 01/31/20   Ladell Pier, MD  atorvastatin (LIPITOR) 40 MG tablet Take 1 tablet (40 mg total) by mouth daily at 6 PM. 01/31/20   Ladell Pier, MD  Blood Glucose Monitoring Suppl (ACCU-CHEK GUIDE ME) w/Device KIT Use to check blood sugar daily. E11.9 03/20/20   Ladell Pier, MD  furosemide (LASIX) 40 MG tablet Take 1 tablet (40 mg total) by mouth daily. 01/31/20   Ladell Pier, MD  glipiZIDE (GLUCOTROL) 5 MG tablet Take 1 tablet (5 mg total) by mouth 2 (two) times daily before a meal. 01/31/20   Ladell Pier, MD  glucose blood (ACCU-CHEK GUIDE) test strip Use to check blood sugar daily. E11.9 03/20/20   Ladell Pier, MD  ibuprofen (ADVIL) 600 MG tablet Take 1 tablet (600 mg total) by mouth every 6 (  six) hours as needed. 06/09/20   Montine Circle, PA-C  metoprolol succinate (TOPROL-XL) 50 MG 24 hr tablet Take 1 tablet (50 mg total) by mouth daily. 01/31/20   Ladell Pier, MD  Omega-3 Fatty Acids (FISH OIL PO) Take 1,000 mg by mouth daily.     [provider]  potassium chloride (KLOR-CON) 10 MEQ tablet Take 1 tablet (10 mEq total) by mouth 2 (two) times daily. 01/31/20   Ladell Pier, MD  sacubitril-valsartan (ENTRESTO) 49-51 MG Take 1 tablet by mouth 2 (two) times daily. 04/19/19   Hilty, Nadean Corwin, MD     Family History Family History  Problem Relation Age of Onset  . Diabetes Mother   . Colon cancer Brother     Social History Social History   Tobacco Use  . Smoking status: Never Smoker  . Smokeless tobacco: Never Used  Vaping Use  . Vaping Use: Never used  Substance Use Topics  . Alcohol use: Yes    Comment: occasionally  . Drug use: Never     Allergies   Lisinopril   Review of Systems Review of Systems  Constitutional: Negative for chills and fever.  Musculoskeletal: Positive for arthralgias, gait problem and joint swelling.     Physical Exam Updated Vital Signs BP (!) 156/90 (BP Location: Left Wrist)   Pulse 75   Temp 98 F (36.7 C) (Oral)   Resp 18   Ht $R'5\' 8"'Kh$  (1.727 m)   Wt 126.1 kg   SpO2 100%   BMI 42.27 kg/m   Physical Exam Nursing note and vitals reviewed.  Constitutional: Pt appears well-developed and well-nourished. No distress.  HENT:  Head: Normocephalic and atraumatic.  Eyes: Conjunctivae are normal.  Neck: Normal range of motion.  Cardiovascular: Normal rate, regular rhythm. Intact distal pulses.   Capillary refill < 3 sec.  Pulmonary/Chest: Effort normal and breath sounds normal.  Musculoskeletal:  Right knee Pt exhibits palpable effusion, no bony deformity.   ROM: 4/5 limited by pain  Strength: 4/5 limited by pain  Neurological: Pt  is alert. Coordination normal.  Sensation: 5/5 Skin: Skin is warm and dry. Pt is not diaphoretic.  No evidence of open wound or skin tenting Psychiatric: Pt has a normal mood and affect.    ED Treatments / Results  Labs (all labs ordered are listed, but only abnormal results are displayed) Labs Reviewed - No data to display  EKG    Radiology CT Knee Right Wo Contrast  Result Date: 06/09/2020 CLINICAL DATA:  Fall with pain EXAM: CT OF THE right KNEE WITHOUT CONTRAST TECHNIQUE: Multidetector CT imaging of the right knee was performed according to the standard protocol. Multiplanar CT image  reconstructions were also generated. COMPARISON:  None. FINDINGS: Bones/Joint/Cartilage No fracture or dislocation. There is tricompartmental osteoarthritis, advanced within the medial compartment with joint space loss subchondral cystic changes and marginal osteophyte formation. There is a moderate knee joint effusion present. Ligaments Suboptimally assessed by CT. Muscles and Tendons The muscles surrounding the knee are normal appearance without focal atrophy or tear. The patellar and quadriceps tendon are intact. Soft tissues Mild prepatellar subcutaneous edema is seen. There is scattered dense vascular calcifications seen. IMPRESSION: No acute osseous abnormality. Tricompartmental osteoarthritis, advanced within the medial compartment. Moderate knee joint effusion. Electronically Signed   By: Prudencio Pair M.D.   On: 06/09/2020 23:06   DG Knee Complete 4 Views Right  Result Date: 06/09/2020 CLINICAL DATA:  Fall with pain and swelling EXAM: RIGHT KNEE -  COMPLETE 4+ VIEW COMPARISON:  None. FINDINGS: No dislocation. Moderate to large knee effusion. Questionable mild depression of the medial tibial plateau at the joint margin. Moderate tricompartment arthritis. Vascular calcifications. IMPRESSION: Moderate to large knee effusion with questionable mild depression of the medial tibial plateau at the joint margin. Suggest CT for further evaluation. Electronically Signed   By: Donavan Foil M.D.   On: 06/09/2020 20:55    Procedures Procedures (including critical care time)  Medications Ordered in ED Medications - No data to display   Initial Impression / Assessment and Plan / ED Course  I have reviewed the triage vital signs and the nursing notes.  Pertinent labs & imaging results that were available during my care of the patient were reviewed by me and considered in my medical decision making (see chart for details).        Patient presents with injury to right knee pain.  DDx includes,  fracture, strain, or sprain.  Consultants: None  Plain films reveal possible fracture of tibial plateau.  CT negative for fracture.  Pt advised to follow up with PCP and/or orthopedics. Patient given knee immobilizer and crutches while in ED, conservative therapy such as RICE recommended and discussed.   Patient will be discharged home & is agreeable with above plan. Returns precautions discussed. Pt appears safe for discharge.   Final Clinical Impressions(s) / ED Diagnoses   Final diagnoses:  Acute pain of right knee    ED Discharge Orders         Ordered    ibuprofen (ADVIL) 600 MG tablet  Every 6 hours PRN        06/09/20 2347           Montine Circle, PA-C 06/09/20 [place], April, MD 06/10/20 [id number]

## 2020-06-10 DIAGNOSIS — M25561 Pain in right knee: Secondary | ICD-10-CM | POA: Diagnosis not present

## 2020-06-10 NOTE — Progress Notes (Signed)
Orthopedic Tech Progress Note Patient Details:  Tracey Gallegos 1956/01/30 [id number]  Ortho Devices Type of Ortho Device: Knee Immobilizer, Crutches Ortho Device/Splint Location: rle Ortho Device/Splint Interventions: Ordered, Application, Adjustment   Post Interventions Patient Tolerated: Well Instructions Provided: Care of device, Adjustment of device   Karolee Stamps 06/10/2020, 1:30 AM

## 2020-06-22 MED FILL — AMLODIPINE BESYLATE 5 MG TA: 5 | 30 days supply | Qty: 30 | Fill #5

## 2020-06-22 MED FILL — METOPROLOL SUCCINATE ER 50: 50 | 30 days supply | Qty: 30 | Fill #5

## 2020-06-24 IMAGING — DX DG CHEST 1V PORT
1 series · 1 of 1 positions shown · non-contrast
Comparison: None.

CLINICAL DATA: Acute shortness of breath for several weeks.

EXAM:
PORTABLE CHEST 1 VIEW

[chest ap]
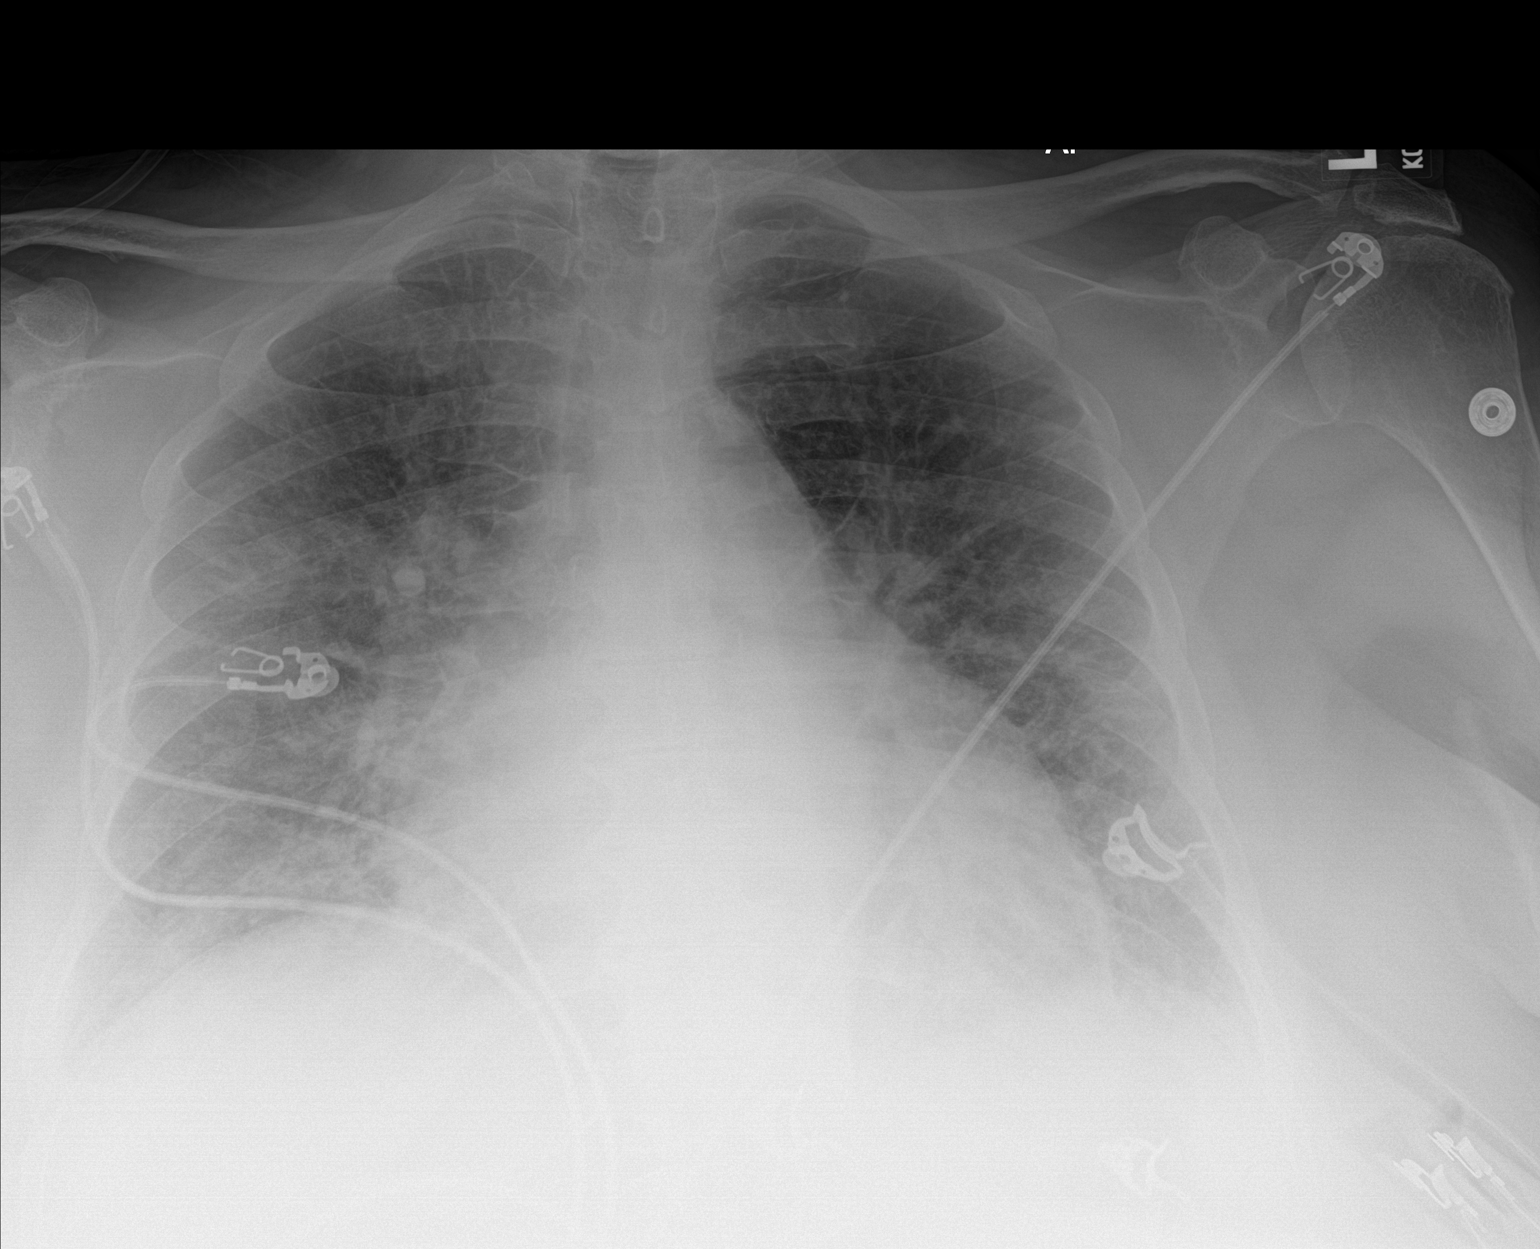

[1 of 1 positions shown; findings below may reference images not displayed]

FINDINGS: Cardiomegaly with pulmonary vascular congestion noted. Bilateral
interstitial opacities likely represent interstitial edema.

There may be trace bilateral pleural effusions present.

No pneumothorax or acute bony abnormalities are noted.
IMPRESSION: Cardiomegaly with interstitial opacities likely representing
interstitial pulmonary edema. Possible trace bilateral pleural
effusions.

## 2020-07-02 MED FILL — ACCU-CHEK GUIDE TEST STRIP: 90 days supply | Qty: 100 | Fill #1

## 2020-07-02 MED FILL — glipiZIDE 5 MG TABS: 5 | 30 days supply | Qty: 60 | Fill #5

## 2020-07-17 MED FILL — ATORVASTATIN CALCIUM 40 MG: 40 | 30 days supply | Qty: 30 | Fill #5

## 2020-07-24 MED FILL — METOPROLOL SUCCINATE ER 50: 50 | 30 days supply | Qty: 30 | Fill #6

## 2020-07-24 MED FILL — AMLODIPINE BESYLATE 5 MG TA: 5 | 30 days supply | Qty: 30 | Fill #6

## 2020-08-01 MED FILL — glipiZIDE 5 MG TABS: 5 | 30 days supply | Qty: 60 | Fill #6

## 2020-08-06 ENCOUNTER — Ambulatory Visit: Payer: Medicare PPO | Admitting: Internal Medicine

## 2020-08-20 ENCOUNTER — Other Ambulatory Visit: Payer: Self-pay | Admitting: Internal Medicine

## 2020-08-20 DIAGNOSIS — I2510 Atherosclerotic heart disease of native coronary artery without angina pectoris: Secondary | ICD-10-CM

## 2020-08-20 DIAGNOSIS — I10 Essential (primary) hypertension: Secondary | ICD-10-CM

## 2020-08-20 MED FILL — FUROSEMIDE 40 MG TAB: 40 | 90 days supply | Qty: 90 | Fill #2

## 2020-08-20 MED FILL — ATORVASTATIN CALCIUM 40 MG: 40 | 30 days supply | Qty: 30 | Fill #6

## 2020-08-20 NOTE — Telephone Encounter (Signed)
Requested medications are due for refill today.  Yes  Requested medications are on the active medications list.  yes  Last refill. 01/31/2020  Future visit scheduled.   Yes  Notes to clinic. Per note Pt needs OV for more

## 2020-08-20 NOTE — Telephone Encounter (Signed)
Courtesy refill given on Norvasc before.

## 2020-08-21 MED FILL — METOPROLOL SUCCINATE ER 50: 50 | 30 days supply | Qty: 30 | Fill #0

## 2020-08-22 ENCOUNTER — Other Ambulatory Visit: Payer: Self-pay | Admitting: Internal Medicine

## 2020-08-22 MED FILL — AMLODIPINE BESYLATE 5 MG TA: 5 | 30 days supply | Qty: 30 | Fill #0

## 2020-09-06 ENCOUNTER — Other Ambulatory Visit: Payer: Self-pay | Admitting: Internal Medicine

## 2020-09-06 DIAGNOSIS — E1159 Type 2 diabetes mellitus with other circulatory complications: Secondary | ICD-10-CM

## 2020-09-06 MED FILL — glipiZIDE 5 MG TABS: 5 | 30 days supply | Qty: 60 | Fill #0

## 2020-09-18 ENCOUNTER — Ambulatory Visit: Payer: Medicare PPO | Attending: Internal Medicine | Admitting: Internal Medicine

## 2020-09-18 ENCOUNTER — Other Ambulatory Visit: Payer: Self-pay | Admitting: Internal Medicine

## 2020-09-18 ENCOUNTER — Other Ambulatory Visit: Payer: Self-pay

## 2020-09-18 ENCOUNTER — Encounter: Payer: Self-pay | Admitting: Internal Medicine

## 2020-09-18 ENCOUNTER — Other Ambulatory Visit (HOSPITAL_COMMUNITY)
Admission: RE | Admit: 2020-09-18 | Discharge: 2020-09-18 | Disposition: A | Discharge status: Home / Self Care | Payer: Medicare PPO | Source: Ambulatory Visit | Attending: Internal Medicine | Admitting: Internal Medicine

## 2020-09-18 VITALS — BP 129/74 | HR 65 | Resp 16 | Wt 288.6 lb

## 2020-09-18 DIAGNOSIS — Z124 Encounter for screening for malignant neoplasm of cervix: Secondary | ICD-10-CM

## 2020-09-18 DIAGNOSIS — Z1151 Encounter for screening for human papillomavirus (HPV): Secondary | ICD-10-CM | POA: Insufficient documentation

## 2020-09-18 DIAGNOSIS — N950 Postmenopausal bleeding: Secondary | ICD-10-CM | POA: Diagnosis not present

## 2020-09-18 DIAGNOSIS — Z780 Asymptomatic menopausal state: Secondary | ICD-10-CM | POA: Diagnosis not present

## 2020-09-18 DIAGNOSIS — R87810 Cervical high risk human papillomavirus (HPV) DNA test positive: Secondary | ICD-10-CM | POA: Diagnosis not present

## 2020-09-18 DIAGNOSIS — I2510 Atherosclerotic heart disease of native coronary artery without angina pectoris: Secondary | ICD-10-CM

## 2020-09-18 MED FILL — AMLODIPINE BESYLATE 5 MG TA: 5 | 30 days supply | Qty: 30 | Fill #1

## 2020-09-18 NOTE — Telephone Encounter (Signed)
Future visit in 2 months  

## 2020-09-18 NOTE — Progress Notes (Signed)
Patient ID: Tracey Gallegos, female    DOB: [birth year]  MRN: [id number]  CC: Gynecologic Exam   Subjective: Tracey Gallegos is a 65 y.o. female who presents for pap  Her concerns today include:  Patient with history of HTN, DM type II,CAD with DES to LT CXM, Diastolic and systolic CHFwith EF DJ57-01%,[id number] pulmonary hypertension on echo 04/2018, morbid obesity.  Last pap was 12/2018.  Neg cytology, + HPV with neg subtypes No irritation or dischg.  Not sexually active and does not want STI screen Reports random vaginal bleeding that last 1 day in 2020 No blood in urine.  No incontinence. Due for Bone density Paternal GM and some cousins had breast CA. Patient Active Problem List   Diagnosis Date Noted  . Dyslipidemia, goal LDL below 70 12/09/2019  . Papanicolaou smear of cervix with low risk human papillomavirus (HPV) DNA test positive 01/21/2019  . Influenza vaccination declined 07/20/2018  . Chronic combined systolic and diastolic CHF (congestive heart failure) ([place]) 07/20/2018  . Positive depression screening 07/20/2018  . CAD (coronary artery disease) 07/09/2018  . Hypertensive heart disease with acute on chronic systolic congestive heart failure ([place]) 06/24/2018  . Type 2 diabetes mellitus without complication, without long-term current use of insulin ([place]) 06/24/2018  . Morbid obesity ([place])      Current Outpatient Medications on File Prior to Visit  Medication Sig Dispense Refill  . amLODipine (NORVASC) 5 MG tablet Take 1 tablet (5 mg total) by mouth daily. 30 tablet 1  . Accu-Chek Softclix Lancets lancets Use to check blood sugar daily. E11.9 100 each 2  . aspirin (ASPIRIN 81) 81 MG EC tablet Take 1 tablet (81 mg total) by mouth daily. 100 tablet 2  . atorvastatin (LIPITOR) 40 MG tablet Take 1 tablet (40 mg total) by mouth daily at 6 PM. 30 tablet 6  . Blood Glucose Monitoring Suppl (ACCU-CHEK GUIDE ME) w/Device KIT Use to check blood sugar daily. E11.9 1 kit 0  .  furosemide (LASIX) 40 MG tablet Take 1 tablet (40 mg total) by mouth daily. 90 tablet 3  . glipiZIDE (GLUCOTROL) 5 MG tablet TAKE 1 TABLET (5 MG TOTAL) BY MOUTH 2 (TWO) TIMES DAILY BEFORE A MEAL. 60 tablet 1  . glucose blood (ACCU-CHEK GUIDE) test strip Use to check blood sugar daily. E11.9 100 each 2  . ibuprofen (ADVIL) 600 MG tablet Take 1 tablet (600 mg total) by mouth every 6 (six) hours as needed. 30 tablet 0  . metoprolol succinate (TOPROL-XL) 50 MG 24 hr tablet TAKE 1 TABLET (50 MG TOTAL) BY MOUTH DAILY. 30 tablet 0  . Omega-3 Fatty Acids (FISH OIL PO) Take 1,000 mg by mouth daily.     . potassium chloride (KLOR-CON) 10 MEQ tablet Take 1 tablet (10 mEq total) by mouth 2 (two) times daily. 180 tablet 6  . sacubitril-valsartan (ENTRESTO) 49-51 MG Take 1 tablet by mouth 2 (two) times daily. 60 tablet 11   No current facility-administered medications on file prior to visit.    Allergies  Allergen Reactions  . Lisinopril Cough    Social History   Socioeconomic History  . Marital status: Single    Spouse name: Not on file  . Number of children: 0  . Years of education: Not on file  . Highest education level: Not on file  Occupational History  . Not on file  Tobacco Use  . Smoking status: Never Smoker  . Smokeless tobacco: Never Used  Vaping Use  .  Vaping Use: Never used  Substance and Sexual Activity  . Alcohol use: Yes    Comment: occasionally  . Drug use: Never  . Sexual activity: Not Currently  Other Topics Concern  . Not on file  Social History Narrative  . Not on file   Social Determinants of Health   Financial Resource Strain: Not on file  Food Insecurity: Not on file  Transportation Needs: Not on file  Physical Activity: Not on file  Stress: Not on file  Social Connections: Not on file  Intimate Partner Violence: Not on file    Family History  Problem Relation Age of Onset  . Diabetes Mother   . Colon cancer Brother     Past Surgical History:   Procedure Laterality Date  . CARDIAC CATHETERIZATION  06/28/2018  . CORONARY STENT INTERVENTION N/A 06/28/2018   Procedure: CORONARY STENT INTERVENTION;  Surgeon: Leonie Man, MD;  Location: [facility];  Service: Cardiovascular;  Laterality: N/A;  . CORONARY STENT PLACEMENT  06/28/2018  . RIGHT/LEFT HEART CATH AND CORONARY ANGIOGRAPHY N/A 06/28/2018   Procedure: RIGHT/LEFT HEART CATH AND CORONARY ANGIOGRAPHY;  Surgeon: Leonie Man, MD;  Location: [facility];  Service: Cardiovascular;  Laterality: N/A;  . TOTAL KNEE ARTHROPLASTY Left 05/2016    ROS: Review of Systems Negative except as stated above  PHYSICAL EXAM: BP 129/74   Pulse 65   Resp 16   Wt 288 lb 9.6 oz (130.9 kg)   SpO2 96%   BMI 43.88 kg/m   Physical Exam  General appearance - alert, well appearing, and in no distress Mental status - normal mood, behavior, speech, dress, motor activity, and thought processes Pelvic - CMA [place] present: normal external genitalia, vulva, vagina, cervix, uterus and adnexa   CMP Latest Ref Rng & Units 01/31/2020 07/27/2018 07/09/2018  Glucose 65 - 99 mg/dL 88 141(H) 144(H)  BUN 8 - 27 mg/dL $Remove'16 20 22  'cwBNoIW$ Creatinine 0.57 - 1.00 mg/dL 1.11(H) 1.06(H) 1.08(H)  Sodium 134 - 144 mmol/L 142 142 145(H)  Potassium 3.5 - 5.2 mmol/L 4.4 4.3 3.8  Chloride 96 - 106 mmol/L 103 102 105  CO2 20 - 29 mmol/L $RemoveB'27 22 21  'OGoNpDwQ$ Calcium 8.7 - 10.3 mg/dL 9.8 9.5 9.7  Total Protein 6.0 - 8.5 g/dL 7.4 6.9 -  Total Bilirubin 0.0 - 1.2 mg/dL 0.7 0.5 -  Alkaline Phos 48 - 121 IU/L 122(H) 128(H) -  AST 0 - 40 IU/L 20 33 -  ALT 0 - 32 IU/L 14 33(H) -   Lipid Panel     Component Value Date/Time   CHOL 230 (H) 01/31/2020 1620   TRIG 90 01/31/2020 1620   HDL 57 01/31/2020 1620   CHOLHDL 4.0 01/31/2020 1620   CHOLHDL 5.5 04/27/2018 1513   VLDL 17 04/27/2018 1513   LDLCALC 157 (H) 01/31/2020 1620    CBC    Component Value Date/Time   WBC 5.6 01/31/2020 1620   WBC 5.4 06/30/2018 0401    RBC 5.30 (H) 01/31/2020 1620   RBC 5.42 (H) 06/30/2018 0401   HGB 14.2 01/31/2020 1620   HCT 45.9 01/31/2020 1620   PLT 182 01/31/2020 1620   MCV 87 01/31/2020 1620   MCH 26.8 01/31/2020 1620   MCH 24.7 (L) 06/30/2018 0401   MCHC 30.9 (L) 01/31/2020 1620   MCHC 30.3 06/30/2018 0401   RDW 14.2 01/31/2020 1620   LYMPHSABS 1.7 07/09/2018 0937   MONOABS 0.5 06/24/2018 1305   EOSABS 0.6 (H)  07/09/2018 0937   BASOSABS 0.1 07/09/2018 0937    ASSESSMENT AND PLAN: 1. Pap smear for cervical cancer screening - Cytology - PAP  2. Postmenopausal estrogen deficiency Pt agreeable to osteoporosis screening - DG Bone Density; Future  3. Postmenopausal bleeding - Ambulatory referral to Gynecology     Patient was given the opportunity to ask questions.  Patient verbalized understanding of the plan and was able to repeat key elements of the plan.   No orders of the defined types were placed in this encounter.    Requested Prescriptions    No prescriptions requested or ordered in this encounter    No follow-ups on file.  Karle Plumber, MD, FACP

## 2020-09-19 MED FILL — ATORVASTATIN CALCIUM 40 MG: 40 | 30 days supply | Qty: 30 | Fill #0

## 2020-09-19 MED FILL — METOPROLOL SUCCINATE ER 50: 50 | 30 days supply | Qty: 30 | Fill #0

## 2020-09-20 MED FILL — ACCU-CHEK GUIDE TEST STRIP: 90 days supply | Qty: 100 | Fill #2

## 2020-09-26 ENCOUNTER — Other Ambulatory Visit: Payer: Self-pay | Admitting: Internal Medicine

## 2020-09-26 DIAGNOSIS — R87820 Cervical low risk human papillomavirus (HPV) DNA test positive: Secondary | ICD-10-CM

## 2020-09-26 LAB — CYTOLOGY - PAP
Adequacy: ABSENT
Comment: NEGATIVE
Comment: NEGATIVE
Diagnosis: NEGATIVE
HPV 16: NEGATIVE
HPV 18 / 45: NEGATIVE
High risk HPV: POSITIVE — AB

## 2020-09-28 ENCOUNTER — Other Ambulatory Visit: Payer: Self-pay | Admitting: Internal Medicine

## 2020-09-28 DIAGNOSIS — Z780 Asymptomatic menopausal state: Secondary | ICD-10-CM

## 2020-10-18 ENCOUNTER — Encounter: Payer: Medicare PPO | Admitting: Family Medicine

## 2020-10-19 ENCOUNTER — Telehealth: Payer: Self-pay

## 2020-10-19 ENCOUNTER — Ambulatory Visit (INDEPENDENT_AMBULATORY_CARE_PROVIDER_SITE_OTHER): Payer: Medicare PPO | Admitting: Obstetrics and Gynecology

## 2020-10-19 ENCOUNTER — Other Ambulatory Visit: Payer: Self-pay

## 2020-10-19 ENCOUNTER — Encounter: Payer: Self-pay | Admitting: Obstetrics and Gynecology

## 2020-10-19 DIAGNOSIS — B977 Papillomavirus as the cause of diseases classified elsewhere: Secondary | ICD-10-CM | POA: Insufficient documentation

## 2020-10-19 DIAGNOSIS — N950 Postmenopausal bleeding: Secondary | ICD-10-CM | POA: Diagnosis not present

## 2020-10-19 NOTE — Progress Notes (Signed)
U/S scheduled on 10/31/20 @ 11am, arrive at 10:45a

## 2020-10-19 NOTE — Patient Instructions (Signed)
HPV and Cancer Information HPV (human papillomavirus)is a very common virus that spreads easily from person to person through skin-to-skin or sexual contact. There are many types of HPV. It often does not cause symptoms. However, depending upon the type, it may sometimes cause warts in the genitals (genital or mucosal HPV), or on the hands or feet (cutaneous or nonmucosal HPV). It is possible to be infected for a long time and pass HPV to others without knowing it. Some HPV infections go away on their own within 2 years, but other HPV infections are considered high-risk and may cause changes in cells that could lead to cancer. You can take steps to avoid HPV infection and to lower your risk of getting cancer. How can HPV affect me? HPV can cause warts in the genitals or on the hands or feet. It can also cause wart-like lesions in the throat.  Certain types of genital HPV can also cause cancer, which may include:  Cervical cancer.  Vaginal cancer.  Vulvar cancer.  Anal cancer.  Throat cancer.  Tongue or mouth cancer.  Penile cancer. How does HPV spread? HPV spreads easily through direct person to person contact. Genital HPV spreads through sexual contact. You can get HPV from vaginal sex, oral sex, anal sex, or just by touching someone's genitals. Even people who have only one sexual partner may have HPV because that partner may have it. HPV often does not cause symptoms, so most infected people do not know that they have it. What actions can I take to prevent HPV? Take the following steps to help prevent HPV infection:  Talk with your health care provider about getting the HPV vaccine. This vaccine protects against the types of HPV that could cause cancer.  Limit the number of people you have sex with. Also, avoid having sex with people who have had many sexual partners.  Use a condom during sex.  Talk with your sexual partners about their health.   What actions can I take to lower my  risk for cancer? Having a healthy lifestyle and taking some preventive steps can help lower your cancer risk, whether or not you have genital HPV. Some steps you can take include: Lifestyle  Practice safe sex to help prevent HPV infection.  Do not use any products that contain nicotine or tobacco, such as cigarettes, e-cigarettes, and chewing tobacco. If you need help quitting, ask your health care provider.  Eat foods that have antioxidants, such as fruits, vegetables, and grains. Try to eat at least 5 servings of fruits and vegetables every day.  Get regular exercise.  Lose weight if you are overweight.  Practice good oral hygiene. This includes flossing and brushing your teeth every day. Other preventive steps  Get the HPV vaccine as told by your health care provider.  Get tested for STIs even if you do not have symptoms of HPV. You may have HPV and not know it.  If you are a woman, get regular Pap and HPV tests. Talk with your health care provider about how often you need these tests. Pap tests will help identify changes in cells that can lead to cancer. HPV tests will help identify the presence of HPV in cells in the cervix. Where to find more information Learn more about HPV and cancer from:  Centers for Disease Control and Prevention: http://sweeney-todd.com/  [place]: www.cancer.gov  American Cancer Society: www.cancer.org Contact a health care provider if:  You have genital warts.  You are sexually  active and think you may have HPV.  You did not protect yourself during sex and would like to be tested for STIs. Summary  Human papillomavirus (HPV) is a very common virus that spreads easily from person to person and ishighly contagious.  Certain types of genital HPV are considered to be high risk and may cause changes in cells that could lead to cancer.  You should take steps to avoid HPV infection, such as limiting the number of people you have sex with,  using condoms during sex, and getting the HPV vaccine.  Lifestyle changes can help lower your risk of cancer. These include eating a healthy diet, getting regular exercise, and not using any products that contain nicotine or tobacco.  You may have HPV and not know it. Get tested for STIs even if you do not have symptoms of HPV. If you are a woman, have regular Pap tests and HPV tests as directed by your health care provider. This information is not intended to replace advice given to you by your health care provider. Make sure you discuss any questions you have with your health care provider. Document Revised: 02/21/2020 Document Reviewed: 02/21/2020 Elsevier Patient Education  2021 [place]. Postmenopausal Bleeding Postmenopausal bleeding is any bleeding that occurs after menopause. Menopause is a time in a woman's life when monthly periods stop. Any type of bleeding after menopause should be checked by your doctor. Treatment will depend on the cause. This kind of bleeding can be caused by:  Taking hormones during menopause.  Low or high amounts of female hormones in the body. This can cause the lining of the womb (uterus) to become too thin or too thick.  Cancer.  Growths in the womb that are not cancer. Follow these instructions at home:  Watch for any changes in your symptoms. Let your doctor know about them.  Avoid using tampons and douches as told by your doctor.  Change your pads regularly.  Get regular pelvic exams. This includes Pap tests.  Take iron pills as told by your doctor.  Take over-the-counter and prescription medicines only as told by your doctor.  Keep all follow-up visits.   Contact a doctor if:  You have new bleeding from the vagina after menopause.  You have pain in your belly (abdomen). Get help right away if:  You have a fever or chills.  You have very bad pain with bleeding.  You have clumps of blood (blood clots) coming from your  vagina.  You have a lot of bleeding, and: ? You use more than 1 pad an hour. ? This kind of bleeding has never happened before.  You have headaches.  You feel dizzy or you feel like you are going to pass out (faint). Summary  Any type of bleeding after menopause should be checked by your doctor.  Avoid using tampons or douches.  Get regular pelvic exams. This includes Pap tests.  Contact a doctor if you have new bleeding or pain in your belly.  Watch for any changes in your symptoms. Let your doctor know about them. This information is not intended to replace advice given to you by your health care provider. Make sure you discuss any questions you have with your health care provider. Document Revised: 12/22/2019 Document Reviewed: 12/22/2019 Elsevier Patient Education  [place].

## 2020-10-19 NOTE — Progress Notes (Signed)
Ms Ancheta presents in referral form Dr Wynetta Emery d/t to episode of vaginal bleeding in August 2020. It reports it was only spotting and last for that day only. No further episodes of bleeding No sexual active Chronic medical problems as listed and managed by PCP. Pap 09/2020 negative cytology, HPV +. H/O HPV + in the past BMDX ordered by PCP Mammogram UTP  PE AF VSS Lungs clear Heart RRR ABd soft + BS GU deferred  A/P PMB        HPV +  PMB reviewed with pt. Will check GYN U/S. EMBX based on U/S findings. Do not think clinically indicated as no further PMB over the last yr and a half. Information provided. HPV and pap smear results reviewed with pt. Per ASCCP guidelines repeat in 1 yr with HPV co testing. HPV information provided to pt. F/U per test results or PRN

## 2020-10-19 NOTE — Telephone Encounter (Signed)
Called Pt to advise of U/S appt scheduled for 10/31/20 @ 11am & to arrive @ 10:45.

## 2020-10-23 ENCOUNTER — Other Ambulatory Visit: Payer: Self-pay

## 2020-10-23 ENCOUNTER — Other Ambulatory Visit: Payer: Self-pay | Admitting: Internal Medicine

## 2020-10-23 ENCOUNTER — Ambulatory Visit
Admission: RE | Admit: 2020-10-23 | Discharge: 2020-10-23 | Disposition: A | Discharge status: Home / Self Care | Payer: Medicare PPO | Source: Ambulatory Visit | Attending: Internal Medicine | Admitting: Internal Medicine

## 2020-10-23 DIAGNOSIS — Z780 Asymptomatic menopausal state: Secondary | ICD-10-CM | POA: Diagnosis not present

## 2020-10-23 DIAGNOSIS — I10 Essential (primary) hypertension: Secondary | ICD-10-CM

## 2020-10-23 DIAGNOSIS — M8589 Other specified disorders of bone density and structure, multiple sites: Secondary | ICD-10-CM | POA: Diagnosis not present

## 2020-10-23 DIAGNOSIS — I2510 Atherosclerotic heart disease of native coronary artery without angina pectoris: Secondary | ICD-10-CM

## 2020-10-23 DIAGNOSIS — E1159 Type 2 diabetes mellitus with other circulatory complications: Secondary | ICD-10-CM

## 2020-10-23 MED ORDER — METOPROLOL SUCCINATE ER 50 MG PO TB24
ORAL_TABLET | Freq: Every day | ORAL | 0 refills | Status: DC
  Filled 2020-10-23: qty 30, 30d supply, fill #0

## 2020-10-23 MED ORDER — GLIPIZIDE 5 MG PO TABS
ORAL_TABLET | Freq: Two times a day (BID) | ORAL | 1 refills | Status: DC
  Filled 2020-10-23 – 2020-11-04 (×2): qty 60, 30d supply, fill #0
  Filled 2020-12-13: qty 60, 30d supply, fill #1

## 2020-10-23 MED ORDER — AMLODIPINE BESYLATE 5 MG PO TABS
ORAL_TABLET | Freq: Every day | ORAL | 1 refills | Status: DC
  Filled 2020-10-23: qty 30, 30d supply, fill #0
  Filled 2020-11-20: qty 30, 30d supply, fill #1

## 2020-10-23 MED FILL — Atorvastatin Calcium Tab 40 MG (Base Equivalent): ORAL | 30 days supply | Qty: 30 | Fill #0 | Status: AC

## 2020-10-25 ENCOUNTER — Other Ambulatory Visit: Payer: Self-pay

## 2020-10-31 ENCOUNTER — Ambulatory Visit
Admission: RE | Admit: 2020-10-31 | Discharge: 2020-10-31 | Disposition: A | Discharge status: Home / Self Care | Payer: Medicare PPO | Source: Ambulatory Visit | Attending: Obstetrics and Gynecology | Admitting: Obstetrics and Gynecology

## 2020-10-31 ENCOUNTER — Other Ambulatory Visit: Payer: Self-pay

## 2020-10-31 DIAGNOSIS — N950 Postmenopausal bleeding: Secondary | ICD-10-CM | POA: Diagnosis not present

## 2020-11-05 ENCOUNTER — Other Ambulatory Visit: Payer: Self-pay

## 2020-11-08 ENCOUNTER — Other Ambulatory Visit: Payer: Self-pay

## 2020-11-08 ENCOUNTER — Encounter: Payer: Self-pay | Admitting: Internal Medicine

## 2020-11-08 DIAGNOSIS — E119 Type 2 diabetes mellitus without complications: Secondary | ICD-10-CM | POA: Diagnosis not present

## 2020-11-08 LAB — HM DIABETES EYE EXAM

## 2020-11-20 ENCOUNTER — Other Ambulatory Visit: Payer: Self-pay | Admitting: Internal Medicine

## 2020-11-20 ENCOUNTER — Ambulatory Visit: Payer: Medicare PPO | Admitting: Internal Medicine

## 2020-11-20 ENCOUNTER — Other Ambulatory Visit: Payer: Self-pay

## 2020-11-20 DIAGNOSIS — I2510 Atherosclerotic heart disease of native coronary artery without angina pectoris: Secondary | ICD-10-CM

## 2020-11-20 MED ORDER — METOPROLOL SUCCINATE ER 50 MG PO TB24
ORAL_TABLET | Freq: Every day | ORAL | 0 refills | Status: DC
  Filled 2020-11-20: qty 30, 30d supply, fill #0

## 2020-11-20 MED FILL — Furosemide Tab 40 MG: ORAL | 90 days supply | Qty: 90 | Fill #0 | Status: AC

## 2020-11-20 MED FILL — Atorvastatin Calcium Tab 40 MG (Base Equivalent): ORAL | 30 days supply | Qty: 30 | Fill #1 | Status: AC

## 2020-11-26 ENCOUNTER — Other Ambulatory Visit: Payer: Self-pay

## 2020-12-02 NOTE — Addendum Note (Signed)
Encounter addended by: Annie Paras on: 12/02/2020 6:58 PM  Actions taken: Letter saved

## 2020-12-13 ENCOUNTER — Other Ambulatory Visit: Payer: Self-pay

## 2020-12-14 ENCOUNTER — Other Ambulatory Visit: Payer: Self-pay

## 2020-12-27 ENCOUNTER — Other Ambulatory Visit: Payer: Self-pay

## 2020-12-27 ENCOUNTER — Other Ambulatory Visit: Payer: Self-pay | Admitting: Internal Medicine

## 2020-12-27 DIAGNOSIS — I2510 Atherosclerotic heart disease of native coronary artery without angina pectoris: Secondary | ICD-10-CM

## 2020-12-27 MED ORDER — METOPROLOL SUCCINATE ER 50 MG PO TB24
ORAL_TABLET | Freq: Every day | ORAL | 0 refills | Status: DC
  Filled 2020-12-27: qty 30, 30d supply, fill #0

## 2020-12-27 MED ORDER — ATORVASTATIN CALCIUM 40 MG PO TABS
ORAL_TABLET | Freq: Every day | ORAL | 0 refills | Status: DC
  Filled 2020-12-27: qty 30, 30d supply, fill #0

## 2020-12-31 ENCOUNTER — Other Ambulatory Visit: Payer: Self-pay

## 2021-01-07 ENCOUNTER — Other Ambulatory Visit: Payer: Self-pay | Admitting: Internal Medicine

## 2021-01-07 ENCOUNTER — Other Ambulatory Visit: Payer: Self-pay

## 2021-01-07 DIAGNOSIS — I10 Essential (primary) hypertension: Secondary | ICD-10-CM

## 2021-01-07 DIAGNOSIS — I2510 Atherosclerotic heart disease of native coronary artery without angina pectoris: Secondary | ICD-10-CM

## 2021-01-07 MED ORDER — AMLODIPINE BESYLATE 5 MG PO TABS
ORAL_TABLET | Freq: Every day | ORAL | 0 refills | Status: DC
  Filled 2021-01-07: qty 30, 30d supply, fill #0

## 2021-01-08 ENCOUNTER — Other Ambulatory Visit: Payer: Self-pay

## 2021-01-15 ENCOUNTER — Other Ambulatory Visit: Payer: Self-pay | Admitting: Internal Medicine

## 2021-01-15 ENCOUNTER — Other Ambulatory Visit: Payer: Self-pay

## 2021-01-15 DIAGNOSIS — E1159 Type 2 diabetes mellitus with other circulatory complications: Secondary | ICD-10-CM

## 2021-01-15 MED ORDER — GLIPIZIDE 5 MG PO TABS
ORAL_TABLET | Freq: Two times a day (BID) | ORAL | 1 refills | Status: DC
  Filled 2021-01-15: qty 60, 30d supply, fill #0
  Filled 2021-02-19: qty 60, 30d supply, fill #1

## 2021-01-17 ENCOUNTER — Ambulatory Visit: Payer: Medicare PPO | Attending: Internal Medicine | Admitting: Internal Medicine

## 2021-01-17 ENCOUNTER — Encounter: Payer: Self-pay | Admitting: Internal Medicine

## 2021-01-17 ENCOUNTER — Other Ambulatory Visit: Payer: Self-pay

## 2021-01-17 VITALS — BP 155/85 | HR 63 | Resp 16 | Wt 290.0 lb

## 2021-01-17 DIAGNOSIS — I10 Essential (primary) hypertension: Secondary | ICD-10-CM

## 2021-01-17 DIAGNOSIS — E1159 Type 2 diabetes mellitus with other circulatory complications: Secondary | ICD-10-CM

## 2021-01-17 DIAGNOSIS — I5042 Chronic combined systolic (congestive) and diastolic (congestive) heart failure: Secondary | ICD-10-CM

## 2021-01-17 DIAGNOSIS — Z6841 Body Mass Index (BMI) 40.0 and over, adult: Secondary | ICD-10-CM

## 2021-01-17 DIAGNOSIS — I2510 Atherosclerotic heart disease of native coronary artery without angina pectoris: Secondary | ICD-10-CM

## 2021-01-17 LAB — POCT GLYCOSYLATED HEMOGLOBIN (HGB A1C): HbA1c, POC (controlled diabetic range): 7.5 % — AB (ref 0.0–7.0)

## 2021-01-17 LAB — GLUCOSE, POCT (MANUAL RESULT ENTRY): POC Glucose: 154 mg/dl — AB (ref 70–99)

## 2021-01-17 MED ORDER — AMLODIPINE BESYLATE 10 MG PO TABS
10.0000 mg | ORAL_TABLET | Freq: Every day | ORAL | 6 refills | Status: DC
  Filled 2021-01-17: qty 30, 30d supply, fill #0
  Filled 2021-02-19: qty 30, 30d supply, fill #1
  Filled 2021-03-20: qty 30, 30d supply, fill #2
  Filled 2021-04-22: qty 30, 30d supply, fill #3
  Filled 2021-05-27: qty 30, 30d supply, fill #4
  Filled 2021-06-24: qty 30, 30d supply, fill #5
  Filled 2021-07-25: qty 30, 30d supply, fill #6
  Filled 2021-07-25: qty 30, 30d supply, fill #0

## 2021-01-17 NOTE — Progress Notes (Signed)
Patient ID: Tracey Gallegos, female    DOB: [birth year]  MRN: [id number]  CC: Diabetes and Hypertension   Subjective: Tracey Gallegos is a 65 y.o. female who presents for chronic ds management Her concerns today include:  Patient with history of HTN, DM type II, CAD with DES to LT CXM, Diastolic and systolic CHF with EF of 29-93%, moderate pulmonary hypertension on echo 04/2018, morbid obesity.  Saw Ervin since last visit with me because she was positive again for HPV even though the subtyping was negative.  He recommended rescreening again in 1 year.  DIABETES TYPE 2 Last A1C:   Results for orders placed or performed in visit on 01/17/21  POCT glucose (manual entry)  Result Value Ref Range   POC Glucose 154 (A) 70 - 99 mg/dl  POCT glycosylated hemoglobin (Hb A1C)  Result Value Ref Range   Hemoglobin A1C     HbA1c POC (<> result, manual entry)     HbA1c, POC (prediabetic range)     HbA1c, POC (controlled diabetic range) 7.5 (A) 0.0 - 7.0 %    Med Adherence:  [x]  Yes she is on Glucotrol. Medication side effects:  []  Yes    [x]  No Home Monitoring?  [x]  Yes daily in a.m   []  No Home glucose results range: 150-160 Diet Adherence: []  Yes    [x]  No - has been eating "more stuff that is not good for me." Exercise: [x]  Yes - but not as much as she use to.  Plans to get back to gym    []  No Hypoglycemic episodes?: []  Yes    [x]  No Numbness of the feet? []  Yes    [x]  No Retinopathy hx? []  Yes    []  No Last eye exam:  Comments:     HYPERTENSION/CHF/CAD Currently taking: see medication list.  She is on Entresto, amlodipine, and metoprolol Med Adherence: [x]  Yes    []  No Medication side effects: []  Yes    []  No Adherence with salt restriction: [x]  Yes    []  No Home Monitoring?: []  Yes    [x]  No Monitoring Frequency: []  Yes    []  No Home BP results range: []  Yes    []  No SOB? []  Yes    [x]  No Chest Pain?: []  Yes    [x]  No Leg swelling?: [x]  Yes of and on   []  No Headaches?: []  Yes    [x]   No Dizziness? 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   Dyslipidemia, goal LDL below 70 12/09/2019   Papanicolaou smear of cervix with low risk human papillomavirus (HPV) DNA test positive 01/21/2019   Influenza vaccination declined 07/20/2018   Chronic combined systolic and diastolic CHF (congestive heart failure) ([place]) 07/20/2018   Positive depression screening 07/20/2018   CAD (coronary artery disease) 07/09/2018   Hypertensive heart disease with acute on chronic systolic congestive heart failure ([place]) 06/24/2018   Type 2 diabetes mellitus without complication, without long-term current use of insulin ([place]) 06/24/2018   Morbid obesity ([place])      Current Outpatient Medications on File Prior to Visit  Medication Sig Dispense Refill   Accu-Chek Softclix Lancets lancets Use to check blood sugar daily. E11.9 100 each 2   aspirin (ASPIRIN 81) 81 MG EC tablet Take 1 tablet (81 mg total) by mouth daily. 100 tablet 2   atorvastatin (  LIPITOR) 40 MG tablet TAKE 1 TABLET (40 MG TOTAL) BY MOUTH DAILY AT 6 PM. 30 tablet 0   Blood Glucose Monitoring Suppl (ACCU-CHEK GUIDE ME) w/Device KIT Use to check blood sugar daily. E11.9 1 kit 0   furosemide (LASIX) 40 MG tablet TAKE 1 TABLET (40 MG TOTAL) BY MOUTH DAILY. 90 tablet 3   glipiZIDE (GLUCOTROL) 5 MG tablet Take by mouth 2 (two) times daily before a meal. 60 tablet 1   glucose blood test strip USE TO CHECK BLOOD SUGAR DAILY. E11.9 100 strip 2   ibuprofen (ADVIL) 600 MG tablet Take 1 tablet (600 mg total) by mouth every 6 (six) hours as needed. 30 tablet 0   metoprolol succinate (TOPROL-XL) 50 MG 24 hr tablet TAKE 1 TABLET (50 MG TOTAL) BY MOUTH DAILY. 30 tablet 0   Omega-3 Fatty Acids (FISH OIL PO) Take 1,000 mg by mouth daily.      potassium chloride (KLOR-CON) 10 MEQ tablet Take 1 tablet (10 mEq total) by mouth 2 (two) times daily. 180 tablet  6   sacubitril-valsartan (ENTRESTO) 49-51 MG Take 1 tablet by mouth 2 (two) times daily. 60 tablet 11   No current facility-administered medications on file prior to visit.    Allergies  Allergen Reactions   Lisinopril Cough    Social History   Socioeconomic History   Marital status: Single    Spouse name: Not on file   Number of children: 0   Years of education: Not on file   Highest education level: Not on file  Occupational History   Not on file  Tobacco Use   Smoking status: Never   Smokeless tobacco: Never  Vaping Use   Vaping Use: Never used  Substance and Sexual Activity   Alcohol use: Yes    Comment: occasionally   Drug use: Never   Sexual activity: Not Currently  Other Topics Concern   Not on file  Social History Narrative   Not on file   Social Determinants of Health   Financial Resource Strain: Not on file  Food Insecurity: No Food Insecurity   Worried About Running Out of Food in the Last Year: Never true   Ran Out of Food in the Last Year: Never true  Transportation Needs: No Transportation Needs   Lack of Transportation (Medical): No   Lack of Transportation (Non-Medical): No  Physical Activity: Not on file  Stress: Not on file  Social Connections: Not on file  Intimate Partner Violence: Not on file    Family History  Problem Relation Age of Onset   Diabetes Mother    Colon cancer Brother     Past Surgical History:  Procedure Laterality Date   CARDIAC CATHETERIZATION  06/28/2018   CORONARY STENT INTERVENTION N/A 06/28/2018   Procedure: CORONARY STENT INTERVENTION;  Surgeon: Leonie Man, MD;  Location: [facility];  Service: Cardiovascular;  Laterality: N/A;   CORONARY STENT PLACEMENT  06/28/2018   RIGHT/LEFT HEART CATH AND CORONARY ANGIOGRAPHY N/A 06/28/2018   Procedure: RIGHT/LEFT HEART CATH AND CORONARY ANGIOGRAPHY;  Surgeon: Leonie Man, MD;  Location: [facility];  Service: Cardiovascular;  Laterality: N/A;    TOTAL KNEE ARTHROPLASTY Left 05/2016    ROS: Review of Systems Negative except as stated above  PHYSICAL EXAM: BP (!) 155/85   Pulse 63   Resp 16   Wt 290 lb (131.5 kg)   SpO2 98%   BMI 44.09 kg/m   Wt Readings from Last 3  Encounters:  01/17/21 290 lb (131.5 kg)  10/19/20 286 lb 1.6 oz (129.8 kg)  09/18/20 288 lb 9.6 oz (130.9 kg)    Physical Exam  General appearance - alert, well appearing, obese older African-American female and in no distress Mental status - normal mood, behavior, speech, dress, motor activity, and thought processes Neck - supple, no significant adenopathy Chest - clear to auscultation, no wheezes, rales or rhonchi, symmetric air entry Heart - normal rate, regular rhythm, normal S1, S2, no murmurs, rubs, clicks or gallops Extremities - peripheral pulses normal, no pedal edema, no clubbing or cyanosis Diabetic Foot Exam - Simple   Simple Foot Form Visual Inspection No deformities, no ulcerations, no other skin breakdown bilaterally: Yes Sensation Testing Intact to touch and monofilament testing bilaterally: Yes Pulse Check Posterior Tibialis and Dorsalis pulse intact bilaterally: Yes Comments      CMP Latest Ref Rng & Units 01/31/2020 07/27/2018 07/09/2018  Glucose 65 - 99 mg/dL 88 141(H) 144(H)  BUN 8 - 27 mg/dL $Remove'16 20 22  'jcaZjiW$ Creatinine 0.57 - 1.00 mg/dL 1.11(H) 1.06(H) 1.08(H)  Sodium 134 - 144 mmol/L 142 142 145(H)  Potassium 3.5 - 5.2 mmol/L 4.4 4.3 3.8  Chloride 96 - 106 mmol/L 103 102 105  CO2 20 - 29 mmol/L $RemoveB'27 22 21  '[id number]$ Calcium 8.7 - 10.3 mg/dL 9.8 9.5 9.7  Total Protein 6.0 - 8.5 g/dL 7.4 6.9 -  Total Bilirubin 0.0 - 1.2 mg/dL 0.7 0.5 -  Alkaline Phos 48 - 121 IU/L 122(H) 128(H) -  AST 0 - 40 IU/L 20 33 -  ALT 0 - 32 IU/L 14 33(H) -   Lipid Panel     Component Value Date/Time   CHOL 230 (H) 01/31/2020 1620   TRIG 90 01/31/2020 1620   HDL 57 01/31/2020 1620   CHOLHDL 4.0 01/31/2020 1620   CHOLHDL 5.5 04/27/2018 1513   VLDL 17 04/27/2018  1513   LDLCALC 157 (H) 01/31/2020 1620    CBC    Component Value Date/Time   WBC 5.6 01/31/2020 1620   WBC 5.4 06/30/2018 0401   RBC 5.30 (H) 01/31/2020 1620   RBC 5.42 (H) 06/30/2018 0401   HGB 14.2 01/31/2020 1620   HCT 45.9 01/31/2020 1620   PLT 182 01/31/2020 1620   MCV 87 01/31/2020 1620   MCH 26.8 01/31/2020 1620   MCH 24.7 (L) 06/30/2018 0401   MCHC 30.9 (L) 01/31/2020 1620   MCHC 30.3 06/30/2018 0401   RDW 14.2 01/31/2020 1620   LYMPHSABS 1.7 07/09/2018 0937   MONOABS 0.5 06/24/2018 1305   EOSABS 0.6 (H) 07/09/2018 0937   BASOSABS 0.1 07/09/2018 0937    ASSESSMENT AND PLAN:  1. Type 2 diabetes mellitus with other circulatory complication, without long-term current use of insulin (HCC) A1c is above goal.  We discussed stopping the glipizide and putting her on Trulicity which will help with blood sugar control and weight loss.  Patient states she will think about it but for now she would like to remain on the Glucotrol without adding any medication.  She will work on getting her A1c below 7 through changing her eating habits and starting an exercise routine again.  Declines referral to nutritionist at this time. - POCT glucose (manual entry) - POCT glycosylated hemoglobin (Hb A1C) - Microalbumin / creatinine urine ratio - CBC - Comprehensive metabolic panel - Lipid panel  2. Essential hypertension Not at goal.  Increase amlodipine to 10 mg daily.  Continue other medications. - amLODipine (NORVASC) 10  MG tablet; Take 1 tablet (10 mg total) by mouth daily.  Dispense: 30 tablet; Refill: 6  3. Coronary artery disease involving native coronary artery of native heart without angina pectoris Stable.  Continue aspirin, atorvastatin and metoprolol  4. Class 3 severe obesity due to excess calories with serious comorbidity and body mass index (BMI) of 40.0 to 44.9 in adult [facility]) See #1 above.  I discussed with her our medical weight management program.  She will think about it  but declines for now.  She also declines referral to nutritionist.  5. Chronic combined systolic and diastolic CHF (congestive heart failure) (HCC) Stable and compensated.  Continue furosemide and Entresto.    Patient was given the opportunity to ask questions.  Patient verbalized understanding of the plan and was able to repeat key elements of the plan.   Orders Placed This Encounter  Procedures   Microalbumin / creatinine urine ratio   CBC   Comprehensive metabolic panel   Lipid panel   POCT glucose (manual entry)   POCT glycosylated hemoglobin (Hb A1C)     Requested Prescriptions   Signed Prescriptions Disp Refills   amLODipine (NORVASC) 10 MG tablet 30 tablet 6    Sig: Take 1 tablet (10 mg total) by mouth daily.    Return in about 4 months (around 05/19/2021).  Karle Plumber, MD, FACP

## 2021-01-17 NOTE — Patient Instructions (Signed)
Your blood pressure is not at goal.  Increase Amlodipine to 10 mg daily.  Healthy Eating Following a healthy eating pattern may help you to achieve and maintain a healthy body weight, reduce the risk of chronic disease, and live a long and productive life. It is important to follow a healthy eating pattern at an appropriate calorie level for your body. Your nutritional needs should be metprimarily through food by choosing a variety of nutrient-rich foods. What are tips for following this plan? Reading food labels Read labels and choose the following: Reduced or low sodium. Juices with 100% fruit juice. Foods with low saturated fats and high polyunsaturated and monounsaturated fats. Foods with whole grains, such as whole wheat, cracked wheat, brown rice, and wild rice. Whole grains that are fortified with folic acid. This is recommended for women who are pregnant or who want to become pregnant. Read labels and avoid the following: Foods with a lot of added sugars. These include foods that contain brown sugar, corn sweetener, corn syrup, dextrose, fructose, glucose, high-fructose corn syrup, honey, invert sugar, lactose, malt syrup, maltose, molasses, raw sugar, sucrose, trehalose, or turbinado sugar. Do not eat more than the following amounts of added sugar per day: 6 teaspoons (25 g) for women. 9 teaspoons (38 g) for men. Foods that contain processed or refined starches and grains. Refined grain products, such as white flour, degermed cornmeal, white bread, and white rice. Shopping Choose nutrient-rich snacks, such as vegetables, whole fruits, and nuts. Avoid high-calorie and high-sugar snacks, such as potato chips, fruit snacks, and candy. Use oil-based dressings and spreads on foods instead of solid fats such as butter, stick margarine, or cream cheese. Limit pre-made sauces, mixes, and "instant" products such as flavored rice, instant noodles, and ready-made pasta. Try more plant-protein  sources, such as tofu, tempeh, black beans, edamame, lentils, nuts, and seeds. Explore eating plans such as the Mediterranean diet or vegetarian diet. Cooking Use oil to saut or stir-fry foods instead of solid fats such as butter, stick margarine, or lard. Try baking, boiling, grilling, or broiling instead of frying. Remove the fatty part of meats before cooking. Steam vegetables in water or broth. Meal planning  At meals, imagine dividing your plate into fourths: One-half of your plate is fruits and vegetables. One-fourth of your plate is whole grains. One-fourth of your plate is protein, especially lean meats, poultry, eggs, tofu, beans, or nuts. Include low-fat dairy as part of your daily diet.  Lifestyle Choose healthy options in all settings, including home, work, school, restaurants, or stores. Prepare your food safely: Wash your hands after handling raw meats. Keep food preparation surfaces clean by regularly washing with hot, soapy water. Keep raw meats separate from ready-to-eat foods, such as fruits and vegetables. Cook seafood, meat, poultry, and eggs to the recommended internal temperature. Store foods at safe temperatures. In general: Keep cold foods at 14F (4.4C) or below. Keep hot foods at 114F (60C) or above. Keep your freezer at [facility] (-17.8C) or below. Foods are no longer safe to eat when they have been between the temperatures of 40-114F (4.4-60C) for more than 2 hours. What foods should I eat? Fruits Aim to eat 2 cup-equivalents of fresh, canned (in natural juice), or frozen fruits each day. Examples of 1 cup-equivalent of fruit include 1 small apple, 8large strawberries, 1 cup canned fruit,  cup dried fruit, or 1 cup 100% juice. Vegetables Aim to eat 2-3 cup-equivalents of fresh and frozen vegetables each day, including different varieties and  colors. Examples of 1 cup-equivalent of vegetables include 2 medium carrots, 2 cups raw, leafy greens, 1 cup  choppedvegetable (raw or cooked), or 1 medium baked potato. Grains Aim to eat 6 ounce-equivalents of whole grains each day. Examples of 1 ounce-equivalent of grains include 1 slice of bread, 1 cup ready-to-eat cereal,3 cups popcorn, or  cup cooked rice, pasta, or cereal. Meats and other proteins Aim to eat 5-6 ounce-equivalents of protein each day. Examples of 1 ounce-equivalent of protein include 1 egg, 1/2 cup nuts or seeds, or 1 tablespoon (16 g) peanut butter. A cut of meat or fish that is the size of a deck of cards is about 3-4 ounce-equivalents. Of the protein you eat each week, try to have at least 8 ounces come from seafood. This includes salmon, trout, herring, and anchovies. Dairy Aim to eat 3 cup-equivalents of fat-free or low-fat dairy each day. Examples of 1 cup-equivalent of dairy include 1 cup (240 mL) milk, 8 ounces (250 g) yogurt,1 ounces (44 g) natural cheese, or 1 cup (240 mL) fortified soy milk. Fats and oils Aim for about 5 teaspoons (21 g) per day. Choose monounsaturated fats, such as canola and olive oils, avocados, peanut butter, and most nuts, or polyunsaturated fats, such as sunflower, corn, and soybean oils, walnuts, pine nuts, sesame seeds, sunflower seeds, and flaxseed. Beverages Aim for six 8-oz glasses of water per day. Limit coffee to three to five 8-oz cups per day. Limit caffeinated beverages that have added calories, such as soda and energy drinks. Limit alcohol intake to no more than 1 drink a day for nonpregnant women and 2 drinks a day for men. One drink equals 12 oz of beer (355 mL), 5 oz of wine (148 mL), or 1 oz of hard liquor (44 mL). Seasoning and other foods Avoid adding excess amounts of salt to your foods. Try flavoring foods with herbs and spices instead of salt. Avoid adding sugar to foods. Try using oil-based dressings, sauces, and spreads instead of solid fats. This information is based on general [place] nutrition guidelines. For more information,  visit [employer]. Exact amounts may vary based on your nutrition needs. Summary A healthy eating plan may help you to maintain a healthy weight, reduce the risk of chronic diseases, and stay active throughout your life. Plan your meals. Make sure you eat the right portions of a variety of nutrient-rich foods. Try baking, boiling, grilling, or broiling instead of frying. Choose healthy options in all settings, including home, work, school, restaurants, or stores. This information is not intended to replace advice given to you by your health care provider. Make sure you discuss any questions you have with your healthcare provider. Document Revised: 10/19/2017 Document Reviewed: 10/19/2017 Elsevier Patient Education  [place].

## 2021-01-18 ENCOUNTER — Other Ambulatory Visit: Payer: Self-pay

## 2021-01-18 LAB — COMPREHENSIVE METABOLIC PANEL
ALT: 17 IU/L (ref 0–32)
AST: 24 IU/L (ref 0–40)
Albumin/Globulin Ratio: 1.6 (ref 1.2–2.2)
Albumin: 4.5 g/dL (ref 3.8–4.8)
Alkaline Phosphatase: 99 IU/L (ref 44–121)
BUN/Creatinine Ratio: 16 (ref 12–28)
BUN: 16 mg/dL (ref 8–27)
Bilirubin Total: 0.7 mg/dL (ref 0.0–1.2)
CO2: 27 mmol/L (ref 20–29)
Calcium: 10.3 mg/dL (ref 8.7–10.3)
Chloride: 103 mmol/L (ref 96–106)
Creatinine, Ser: 1 mg/dL (ref 0.57–1.00)
Globulin, Total: 2.8 g/dL (ref 1.5–4.5)
Glucose: 128 mg/dL — ABNORMAL HIGH (ref 65–99)
Potassium: 5.2 mmol/L (ref 3.5–5.2)
Sodium: 143 mmol/L (ref 134–144)
Total Protein: 7.3 g/dL (ref 6.0–8.5)
eGFR: 62 mL/min/{1.73_m2} (ref >59)

## 2021-01-18 LAB — CBC
Hematocrit: 42.4 % (ref 34.0–46.6)
Hemoglobin: 13.9 g/dL (ref 11.1–15.9)
MCH: 27.9 pg (ref 26.6–33.0)
MCHC: 32.8 g/dL (ref 31.5–35.7)
MCV: 85 fL (ref 79–97)
Platelets: 233 10*3/uL (ref 150–450)
RBC: 4.98 x10E6/uL (ref 3.77–5.28)
RDW: 14.4 % (ref 11.7–15.4)
WBC: 5.2 10*3/uL (ref 3.4–10.8)

## 2021-01-18 LAB — LIPID PANEL
Chol/HDL Ratio: 3.1 ratio (ref 0.0–4.4)
Cholesterol, Total: 174 mg/dL (ref 100–199)
HDL: 57 mg/dL (ref >39)
LDL Chol Calc (NIH): 102 mg/dL — ABNORMAL HIGH (ref 0–99)
Triglycerides: 80 mg/dL (ref 0–149)
VLDL Cholesterol Cal: 15 mg/dL (ref 5–40)

## 2021-01-18 LAB — MICROALBUMIN / CREATININE URINE RATIO
Creatinine, Urine: 324.4 mg/dL
Microalb/Creat Ratio: 4 mg/g creat (ref 0–29)
Microalbumin, Urine: 11.4 ug/mL

## 2021-01-31 ENCOUNTER — Ambulatory Visit: Payer: Medicare PPO

## 2021-02-03 ENCOUNTER — Other Ambulatory Visit: Payer: Self-pay | Admitting: Internal Medicine

## 2021-02-03 DIAGNOSIS — I2510 Atherosclerotic heart disease of native coronary artery without angina pectoris: Secondary | ICD-10-CM

## 2021-02-03 DIAGNOSIS — E1159 Type 2 diabetes mellitus with other circulatory complications: Secondary | ICD-10-CM

## 2021-02-04 ENCOUNTER — Other Ambulatory Visit: Payer: Self-pay

## 2021-02-04 MED ORDER — METOPROLOL SUCCINATE ER 50 MG PO TB24
ORAL_TABLET | Freq: Every day | ORAL | 0 refills | Status: DC
  Filled 2021-02-04: qty 30, 30d supply, fill #0

## 2021-02-04 MED ORDER — ATORVASTATIN CALCIUM 40 MG PO TABS
ORAL_TABLET | Freq: Every day | ORAL | 0 refills | Status: DC
  Filled 2021-02-04: qty 30, 30d supply, fill #0

## 2021-02-04 MED ORDER — ACCU-CHEK GUIDE VI STRP
ORAL_STRIP | 2 refills | Status: DC
  Filled 2021-02-04: qty 100, 90d supply, fill #0
  Filled 2021-09-22: qty 100, 90d supply, fill #1
  Filled 2021-09-23: qty 100, 90d supply, fill #0

## 2021-02-04 NOTE — Telephone Encounter (Signed)
Requested Prescriptions  Pending Prescriptions Disp Refills  . metoprolol succinate (TOPROL-XL) 50 MG 24 hr tablet 30 tablet 0    Sig: TAKE 1 TABLET (50 MG TOTAL) BY MOUTH DAILY.     Cardiovascular:  Beta Blockers Failed - 02/03/2021 12:54 PM      Failed - Last BP in normal range    BP Readings from Last 1 Encounters:  01/17/21 (!) 155/85         Passed - Last Heart Rate in normal range    Pulse Readings from Last 1 Encounters:  01/17/21 63         Passed - Valid encounter within last 6 months    Recent Outpatient Visits          2 weeks ago Type 2 diabetes mellitus with other circulatory complication, without long-term current use of insulin ([place])   [place], MD   4 months ago Pap smear for cervical cancer screening   [place], Deborah B, MD   8 months ago Essential hypertension   [place], Deborah B, MD   10 months ago Essential hypertension   [place], Jarome Matin, RPH-CPP   11 months ago Essential hypertension   [place], RPH-CPP      Future Appointments            In 3 months Wynetta Emery, Dalbert Batman, MD [place]           . atorvastatin (LIPITOR) 40 MG tablet 30 tablet 0    Sig: TAKE 1 TABLET (40 MG TOTAL) BY MOUTH DAILY AT 6 PM.     Cardiovascular:  Antilipid - Statins Failed - 02/03/2021 12:54 PM      Failed - LDL in normal range and within 360 days    LDL Chol Calc (NIH)  Date Value Ref Range Status  01/17/2021 102 (H) 0 - 99 mg/dL Final         Passed - Total Cholesterol in normal range and within 360 days    Cholesterol, Total  Date Value Ref Range Status  01/17/2021 174 100 - 199 mg/dL Final         Passed - HDL in normal range and within 360 days    HDL  Date Value Ref Range Status   01/17/2021 57 >39 mg/dL Final         Passed - Triglycerides in normal range and within 360 days    Triglycerides  Date Value Ref Range Status  01/17/2021 80 0 - 149 mg/dL Final         Passed - Patient is not pregnant      Passed - Valid encounter within last 12 months    Recent Outpatient Visits          2 weeks ago Type 2 diabetes mellitus with other circulatory complication, without long-term current use of insulin ([place])   [place], MD   4 months ago Pap smear for cervical cancer screening   [place], Deborah B, MD   8 months ago Essential hypertension   [place], Deborah B, MD   10 months ago Essential hypertension   [place], Annie Main  L, RPH-CPP   11 months ago Essential hypertension   [place], RPH-CPP      Future Appointments            In 3 months Wynetta Emery, Dalbert Batman, MD [place]           . glucose blood (ACCU-CHEK GUIDE) test strip 100 strip 2    Sig: USE TO CHECK BLOOD SUGAR DAILY. E11.9     Endocrinology: Diabetes - Testing Supplies Passed - 02/03/2021 12:54 PM      Passed - Valid encounter within last 12 months    Recent Outpatient Visits          2 weeks ago Type 2 diabetes mellitus with other circulatory complication, without long-term current use of insulin ([place])   [place], MD   4 months ago Pap smear for cervical cancer screening   [place], Deborah B, MD   8 months ago Essential hypertension   [place], Deborah B, MD   10 months ago Essential hypertension   [place], Jarome Matin, RPH-CPP   11 months ago  Essential hypertension   [place], RPH-CPP      Future Appointments            In 3 months Wynetta Emery, Dalbert Batman, MD [place]

## 2021-02-07 ENCOUNTER — Other Ambulatory Visit: Payer: Self-pay | Admitting: Internal Medicine

## 2021-02-07 DIAGNOSIS — I5042 Chronic combined systolic (congestive) and diastolic (congestive) heart failure: Secondary | ICD-10-CM

## 2021-02-07 NOTE — Telephone Encounter (Signed)
Requested medication (s) are due for refill today: Yes  Requested medication (s) are on the active medication list: Yes  Last refill:  01/31/20  Future visit scheduled: Yes  Notes to clinic:  Prescription expired.    Requested Prescriptions  Pending Prescriptions Disp Refills   potassium chloride (KLOR-CON) 10 MEQ tablet [Pharmacy Med Name: POTASSIUM CHLORIDE ER 10 MEQ Tablet Extended Release] 180 tablet 6    Sig: TAKE 1 TABLET TWICE DAILY      Endocrinology:  Minerals - Potassium Supplementation Passed - 02/07/2021  2:07 PM      Passed - K in normal range and within 360 days    Potassium  Date Value Ref Range Status  01/17/2021 5.2 3.5 - 5.2 mmol/L Final          Passed - Cr in normal range and within 360 days    Creatinine, Ser  Date Value Ref Range Status  01/17/2021 1.00 0.57 - 1.00 mg/dL Final          Passed - Valid encounter within last 12 months    Recent Outpatient Visits           3 weeks ago Type 2 diabetes mellitus with other circulatory complication, without long-term current use of insulin ([place])   [place], MD   4 months ago Pap smear for cervical cancer screening   [place], Deborah B, MD   8 months ago Essential hypertension   [place], Deborah B, MD   10 months ago Essential hypertension   [place], Jarome Matin, RPH-CPP   11 months ago Essential hypertension   [place], Jarome Matin, RPH-CPP       Future Appointments             In 3 months Wynetta Emery, Dalbert Batman, MD [place]

## 2021-02-19 ENCOUNTER — Other Ambulatory Visit: Payer: Self-pay

## 2021-02-20 ENCOUNTER — Other Ambulatory Visit: Payer: Self-pay

## 2021-03-04 ENCOUNTER — Other Ambulatory Visit: Payer: Self-pay | Admitting: Internal Medicine

## 2021-03-04 DIAGNOSIS — I2510 Atherosclerotic heart disease of native coronary artery without angina pectoris: Secondary | ICD-10-CM

## 2021-03-04 DIAGNOSIS — I5042 Chronic combined systolic (congestive) and diastolic (congestive) heart failure: Secondary | ICD-10-CM

## 2021-03-04 MED ORDER — FUROSEMIDE 40 MG PO TABS
ORAL_TABLET | Freq: Every day | ORAL | 1 refills | Status: DC
  Filled 2021-03-04: qty 90, 90d supply, fill #0
  Filled 2021-06-18: qty 90, 90d supply, fill #1

## 2021-03-04 MED ORDER — ATORVASTATIN CALCIUM 40 MG PO TABS
ORAL_TABLET | Freq: Every day | ORAL | 3 refills | Status: DC
  Filled 2021-03-04: qty 90, 90d supply, fill #0
  Filled 2021-06-18: qty 90, 90d supply, fill #1
  Filled 2021-09-25: qty 90, 90d supply, fill #2
  Filled 2021-09-26: qty 90, 90d supply, fill #0
  Filled 2021-12-26: qty 90, 90d supply, fill #1

## 2021-03-04 MED ORDER — METOPROLOL SUCCINATE ER 50 MG PO TB24
ORAL_TABLET | Freq: Every day | ORAL | 1 refills | Status: DC
  Filled 2021-03-04: qty 90, 90d supply, fill #0
  Filled 2021-06-06: qty 90, 90d supply, fill #1

## 2021-03-05 ENCOUNTER — Other Ambulatory Visit: Payer: Self-pay

## 2021-03-06 ENCOUNTER — Other Ambulatory Visit: Payer: Self-pay

## 2021-03-20 ENCOUNTER — Other Ambulatory Visit: Payer: Self-pay

## 2021-03-20 ENCOUNTER — Other Ambulatory Visit: Payer: Self-pay | Admitting: Internal Medicine

## 2021-03-20 DIAGNOSIS — E1159 Type 2 diabetes mellitus with other circulatory complications: Secondary | ICD-10-CM

## 2021-03-20 MED ORDER — GLIPIZIDE 5 MG PO TABS
ORAL_TABLET | Freq: Two times a day (BID) | ORAL | 1 refills | Status: DC
  Filled 2021-03-20: qty 180, 90d supply, fill #0
  Filled 2021-06-24: qty 180, 90d supply, fill #1

## 2021-03-21 ENCOUNTER — Other Ambulatory Visit: Payer: Self-pay

## 2021-03-23 ENCOUNTER — Ambulatory Visit (HOSPITAL_BASED_OUTPATIENT_CLINIC_OR_DEPARTMENT_OTHER): Payer: Medicare PPO

## 2021-03-23 DIAGNOSIS — Z0000 Encounter for general adult medical examination without abnormal findings: Secondary | ICD-10-CM

## 2021-03-23 NOTE — Patient Instructions (Signed)
Health Maintenance, Female Adopting a healthy lifestyle and getting preventive care are important in promoting health and wellness. Ask your health care provider about: The right schedule for you to have regular tests and exams. Things you can do on your own to prevent diseases and keep yourself healthy. What should I know about diet, weight, and exercise? Eat a healthy diet  Eat a diet that includes plenty of vegetables, fruits, low-fat dairy products, and lean protein. Do not eat a lot of foods that are high in solid fats, added sugars, or sodium. Maintain a healthy weight Body mass index (BMI) is used to identify weight problems. It estimates body fat based on height and weight. Your health care provider can help determine your BMI and help you achieve or maintain a healthy weight. Get regular exercise Get regular exercise. This is one of the most important things you can do for your health. Most adults should: Exercise for at least 150 minutes each week. The exercise should increase your heart rate and make you sweat (moderate-intensity exercise). Do strengthening exercises at least twice a week. This is in addition to the moderate-intensity exercise. Spend less time sitting. Even light physical activity can be beneficial. Watch cholesterol and blood lipids Have your blood tested for lipids and cholesterol at 66 years of age, then have this test every 5 years. Have your cholesterol levels checked more often if: Your lipid or cholesterol levels are high. You are older than 66 years of age. You are at high risk for heart disease. What should I know about cancer screening? Depending on your health history and family history, you may need to have cancer screening at various ages. This may include screening for: Breast cancer. Cervical cancer. Colorectal cancer. Skin cancer. Lung cancer. What should I know about heart disease, diabetes, and high blood pressure? Blood pressure and heart  disease High blood pressure causes heart disease and increases the risk of stroke. This is more likely to develop in people who have high blood pressure readings, are of African descent, or are overweight. Have your blood pressure checked: Every 3-5 years if you are 18-39 years of age. Every year if you are 40 years old or older. Diabetes Have regular diabetes screenings. This checks your fasting blood sugar level. Have the screening done: Once every three years after age 40 if you are at a normal weight and have a low risk for diabetes. More often and at a younger age if you are overweight or have a high risk for diabetes. What should I know about preventing infection? Hepatitis B If you have a higher risk for hepatitis B, you should be screened for this virus. Talk with your health care provider to find out if you are at risk for hepatitis B infection. Hepatitis C Testing is recommended for: Everyone born from 1945 through 1965. Anyone with known risk factors for hepatitis C. Sexually transmitted infections (STIs) Get screened for STIs, including gonorrhea and chlamydia, if: You are sexually active and are younger than 66 years of age. You are older than 66 years of age and your health care provider tells you that you are at risk for this type of infection. Your sexual activity has changed since you were last screened, and you are at increased risk for chlamydia or gonorrhea. Ask your health care provider if you are at risk. Ask your health care provider about whether you are at high risk for HIV. Your health care provider may recommend a prescription medicine   to help prevent HIV infection. If you choose to take medicine to prevent HIV, you should first get tested for HIV. You should then be tested every 3 months for as long as you are taking the medicine. Pregnancy If you are about to stop having your period (premenopausal) and you may become pregnant, seek counseling before you get  pregnant. Take 400 to 800 micrograms (mcg) of folic acid every day if you become pregnant. Ask for birth control (contraception) if you want to prevent pregnancy. Osteoporosis and menopause Osteoporosis is a disease in which the bones lose minerals and strength with aging. This can result in bone fractures. If you are 65 years old or older, or if you are at risk for osteoporosis and fractures, ask your health care provider if you should: Be screened for bone loss. Take a calcium or vitamin D supplement to lower your risk of fractures. Be given hormone replacement therapy (HRT) to treat symptoms of menopause. Follow these instructions at home: Lifestyle Do not use any products that contain nicotine or tobacco, such as cigarettes, e-cigarettes, and chewing tobacco. If you need help quitting, ask your health care provider. Do not use street drugs. Do not share needles. Ask your health care provider for help if you need support or information about quitting drugs. Alcohol use Do not drink alcohol if: Your health care provider tells you not to drink. You are pregnant, may be pregnant, or are planning to become pregnant. If you drink alcohol: Limit how much you use to 0-1 drink a day. Limit intake if you are breastfeeding. Be aware of how much alcohol is in your drink. In the [place] one drink equals one 12 oz bottle of beer (355 mL), one 5 oz glass of wine (148 mL), or one 1 oz glass of hard liquor (44 mL). General instructions Schedule regular health, dental, and eye exams. Stay current with your vaccines. Tell your health care provider if: You often feel depressed. You have ever been abused or do not feel safe at home. Summary Adopting a healthy lifestyle and getting preventive care are important in promoting health and wellness. Follow your health care provider's instructions about healthy diet, exercising, and getting tested or screened for diseases. Follow your health care provider's  instructions on monitoring your cholesterol and blood pressure. This information is not intended to replace advice given to you by your health care provider. Make sure you discuss any questions you have with your health care provider. Document Revised: 09/14/2020 Document Reviewed: 06/30/2018 Elsevier Patient Education  2022 [employer]  

## 2021-03-23 NOTE — Progress Notes (Signed)
Subjective:   Tracey Gallegos is a 65 y.o. female who presents for an Initial Medicare Annual Wellness Visit.I connected with  Tracey Gallegos on 03/23/21 by a audio enabled telemedicine application and verified that I am speaking with the correct person using two identifiers.   I discussed the limitations of evaluation and management by telemedicine. The patient expressed understanding and agreed to proceed.  Location of patient:Home Location of provider:Office Persons participating: Tracey Gallegos(pt) Tracey Gallegos cma  Review of Systems    Defer to PCP        Objective:    There were no vitals filed for this visit. There is no height or weight on file to calculate BMI.  Advanced Directives 06/09/2020 06/24/2018 06/24/2018 04/27/2018 04/27/2018  Does Patient Have a Medical Advance Directive? No - No No No  Would patient like information on creating a medical advance directive? - No - Patient declined - No - Patient declined No - Patient declined    Current Medications (verified) Outpatient Encounter Medications as of 03/23/2021  Medication Sig   Accu-Chek Softclix Lancets lancets Use to check blood sugar daily. E11.9   amLODipine (NORVASC) 10 MG tablet Take 1 tablet (10 mg total) by mouth daily.   aspirin (ASPIRIN 81) 81 MG EC tablet Take 1 tablet (81 mg total) by mouth daily.   atorvastatin (LIPITOR) 40 MG tablet TAKE 1 TABLET (40 MG TOTAL) BY MOUTH DAILY AT 6 PM.   Blood Glucose Monitoring Suppl (ACCU-CHEK GUIDE ME) w/Device KIT Use to check blood sugar daily. E11.9   furosemide (LASIX) 40 MG tablet TAKE 1 TABLET (40 MG TOTAL) BY MOUTH DAILY.   glipiZIDE (GLUCOTROL) 5 MG tablet Take 1 tablet by mouth 2 (two) times daily before a meal.   glucose blood (ACCU-CHEK GUIDE) test strip USE TO CHECK BLOOD SUGAR DAILY. E11.9   ibuprofen (ADVIL) 600 MG tablet Take 1 tablet (600 mg total) by mouth every 6 (six) hours as needed.   metoprolol succinate (TOPROL-XL) 50 MG 24 hr tablet TAKE 1 TABLET  (50 MG TOTAL) BY MOUTH DAILY.   Multiple Vitamins-Minerals (MULTIVITAMIN WITH MINERALS) tablet Take 1 tablet by mouth daily.   Omega-3 Fatty Acids (FISH OIL PO) Take 1,000 mg by mouth daily.    potassium chloride (KLOR-CON) 10 MEQ tablet TAKE 1 TABLET TWICE DAILY   PRENAT-FECBN-FEBISG-FA-FISHOIL PO Take by mouth.   sacubitril-valsartan (ENTRESTO) 49-51 MG Take 1 tablet by mouth 2 (two) times daily.   No facility-administered encounter medications on file as of 03/23/2021.    Allergies (verified) Lisinopril   History: Past Medical History:  Diagnosis Date   CHF (congestive heart failure) (HCC)    Coronary artery disease    Diabetes mellitus without complication ([place])    Dyspnea    Hypertension    Past Surgical History:  Procedure Laterality Date   CARDIAC CATHETERIZATION  06/28/2018   CORONARY STENT INTERVENTION N/A 06/28/2018   Procedure: CORONARY STENT INTERVENTION;  Surgeon: Leonie Man, MD;  Location: [facility] [facility];  Service: Cardiovascular;  Laterality: N/A;   CORONARY STENT PLACEMENT  06/28/2018   RIGHT/LEFT HEART CATH AND CORONARY ANGIOGRAPHY N/A 06/28/2018   Procedure: RIGHT/LEFT HEART CATH AND CORONARY ANGIOGRAPHY;  Surgeon: Leonie Man, MD;  Location: [facility] [facility];  Service: Cardiovascular;  Laterality: N/A;   TOTAL KNEE ARTHROPLASTY Left 05/2016   Family History  Problem Relation Age of Onset   Diabetes Mother    Colon cancer Brother    Social History  Socioeconomic History   Marital status: Single    Spouse name: Not on file   Number of children: 0   Years of education: Not on file   Highest education level: Not on file  Occupational History   Not on file  Tobacco Use   Smoking status: Never   Smokeless tobacco: Never  Vaping Use   Vaping Use: Never used  Substance and Sexual Activity   Alcohol use: Yes    Comment: occasionally   Drug use: Never   Sexual activity: Not Currently  Other Topics Concern   Not on file  Social History  Narrative   Not on file   Social Determinants of Health   Financial Resource Strain: Low Risk    Difficulty of Paying Living Expenses: Not hard at all  Food Insecurity: No Food Insecurity   Worried About Charity fundraiser in the Last Year: Never true   [place] in the Last Year: Never true  Transportation Needs: No Transportation Needs   Lack of Transportation (Medical): No   Lack of Transportation (Non-Medical): No  Physical Activity: Sufficiently Active   Days of Exercise per Week: 5 days   Minutes of Exercise per Session: 80 min  Stress: No Stress Concern Present   Feeling of Stress : Not at all  Social Connections: Moderately Isolated   Frequency of Communication with Friends and Family: Three times a week   Frequency of Social Gatherings with Friends and Family: Never   Attends Religious Services: Never   Marine scientist or Organizations: Yes   Attends Archivist Meetings: Never   Marital Status: Never married    Tobacco Counseling Counseling given: Not Answered   Clinical Intake:  Pre-visit preparation completed: Yes  Pain : No/denies pain     Nutritional Risks: None Diabetes: Yes CBG done?: Yes CBG resulted in Enter/ Edit results?: Yes Did pt. bring in CBG monitor from home?: Yes Glucose Meter Downloaded?: No  How often do you need to have someone help you when you read instructions, pamphlets, or other written materials from your doctor or pharmacy?: 1 - Never What is the last grade level you completed in school?: masters  Diabetic? yes  Interpreter Needed?: No      Activities of Daily Living No flowsheet data found.  Patient Care Team: Ladell Pier, MD as PCP - General (Internal Medicine) Debara Pickett Nadean Corwin, MD as PCP - Cardiology (Cardiology)  Indicate any recent Medical Services you may have received from other than [facility] providers in the past year (date may be approximate).     Assessment:   This is a routine  wellness examination for [facility].  Hearing/Vision screen No results found.  Dietary issues and exercise activities discussed:     Goals Addressed   None    Depression Screen PHQ 2/9 Scores 03/23/2021 01/17/2021 10/19/2020 09/18/2020 01/31/2020 05/23/2019 01/17/2019  PHQ - 2 Score 0 0 0 0 0 0 0  PHQ- 9 Score - - 0 - - - 1    Fall Risk Fall Risk  01/17/2021 09/18/2020 06/05/2020 01/31/2020 01/17/2019  Falls in the past year? 0 0 0 0 0  Number falls in past yr: 0 0 0 0 -  Injury with Fall? 0 0 0 1 -  Risk for fall due to : No Fall Risks - - - -    FALL RISK PREVENTION PERTAINING TO THE HOME:  Any stairs in or around the home? No  If  so, are there any without handrails? No  Home free of loose throw rugs in walkways, pet beds, electrical cords, etc? No  Adequate lighting in your home to reduce risk of falls? Yes   ASSISTIVE DEVICES UTILIZED TO PREVENT FALLS:  Life alert? No  Use of a cane, walker or w/c? No  Grab bars in the bathroom? No  Shower chair or bench in shower? No  Elevated toilet seat or a handicapped toilet? No   TIMED UP AND GO:  Was the test performed? No .  Length of time to ambulate 10 feet: N/A sec.     Cognitive Function:        Immunizations Immunization History  Administered Date(s) Administered   PFIZER(Purple Top)SARS-COV-2 Vaccination 10/14/2019, 11/09/2019   Pneumococcal Conjugate-13 01/31/2020   Pneumococcal Polysaccharide-23 07/20/2018   Tdap 07/20/2018    TDAP status: Up to date  Flu Vaccine status: Due, Education has been provided regarding the importance of this vaccine. Advised may receive this vaccine at local pharmacy or Health Dept. Aware to provide a copy of the vaccination record if obtained from local pharmacy or Health Dept. Verbalized acceptance and understanding.  Pneumococcal vaccine status: Up to date  Covid-19 vaccine status: Completed vaccines  Qualifies for Shingles Vaccine? Yes   Zostavax completed No   Shingrix Completed?:  No.    Education has been provided regarding the importance of this vaccine. Patient has been advised to call insurance company to determine out of pocket expense if they have not yet received this vaccine. Advised may also receive vaccine at local pharmacy or Health Dept. Verbalized acceptance and understanding.  Screening Tests Health Maintenance  Topic Date Due   Zoster Vaccines- Shingrix (1 of 2) Never done   DEXA SCAN  Never done   COVID-19 Vaccine (3 - Booster for Pfizer series) 04/10/2020   FOOT EXAM  01/30/2021   INFLUENZA VACCINE  Never done   HEMOGLOBIN A1C  07/19/2021   PAP SMEAR-Modifier  09/18/2021   OPHTHALMOLOGY EXAM  11/08/2021   MAMMOGRAM  05/11/2022   PNA vac Low Risk Adult (2 of 2 - PPSV23) 07/21/2023   COLONOSCOPY (Pts 45-40yrs Insurance coverage will need to be confirmed)  02/02/2024   TETANUS/TDAP  07/20/2028   Hepatitis C Screening  Completed   HPV VACCINES  Aged Out    Health Maintenance  Health Maintenance Due  Topic Date Due   Zoster Vaccines- Shingrix (1 of 2) Never done   DEXA SCAN  Never done   COVID-19 Vaccine (3 - Booster for Pfizer series) 04/10/2020   FOOT EXAM  01/30/2021   INFLUENZA VACCINE  Never done    Colorectal cancer screening: Type of screening: Colonoscopy. Completed 01/31/20. Repeat every 3 years  Mammogram status: Completed 05/11/20. Repeat every year  Bone Density status: Ordered DUE. Pt provided with contact info and advised to call to schedule appt.  Lung Cancer Screening: (Low Dose CT Chest recommended if Age 50-80 years, 30 pack-year currently smoking OR have quit w/in 15years.) does qualify.   Lung Cancer Screening Referral: N/A  Additional Screening:  Hepatitis C Screening: does qualify; Completed 01-17-19  Vision Screening: Recommended annual ophthalmology exams for early detection of glaucoma and other disorders of the eye. Is the patient up to date with their annual eye exam?  No  Who is the provider or what is the  name of the office in which the patient attends annual eye exams? Sometime in 2020. If pt is not established with a provider, would  they like to be referred to a provider to establish care? Yes .   Dental Screening: Recommended annual dental exams for proper oral hygiene  Community Resource Referral / Chronic Care Management: CRR required this visit?  No   CCM required this visit?  No      Plan:     I have personally reviewed and noted the following in the patient's chart:   Medical and social history Use of alcohol, tobacco or illicit drugs  Current medications and supplements including opioid prescriptions. Patient is not currently taking opioid prescriptions. Functional ability and status Nutritional status Physical activity Advanced directives List of other physicians Hospitalizations, surgeries, and ER visits in previous 12 months Vitals Screenings to include cognitive, depression, and falls Referrals and appointments  In addition, I have reviewed and discussed with patient certain preventive protocols, quality metrics, and best practice recommendations. A written personalized care plan for preventive services as well as general preventive health recommendations were provided to patient.     Tracey Gallegos, [facility]   03/23/2021   Nurse Notes:non face to face 27 min's  Ms. Tracey Gallegos , Thank you for taking time to come for your Medicare Wellness Visit. I appreciate your ongoing commitment to your health goals. Please review the following plan we discussed and let me know if I can assist you in the future.   These are the goals we discussed:  Goals   None     This is a list of the screening recommended for you and due dates:  Health Maintenance  Topic Date Due   Zoster (Shingles) Vaccine (1 of 2) Never done   DEXA scan (bone density measurement)  Never done   COVID-19 Vaccine (3 - Booster for Pfizer series) 04/10/2020   Complete foot exam   01/30/2021   Flu Shot   Never done   Hemoglobin A1C  07/19/2021   Pap Smear  09/18/2021   Eye exam for diabetics  11/08/2021   Mammogram  05/11/2022   Pneumonia vaccines (2 of 2 - PPSV23) 07/21/2023   Colon Cancer Screening  02/02/2024   Tetanus Vaccine  07/20/2028   Hepatitis C Screening: USPSTF Recommendation to screen - Ages 18-79 yo.  Completed   HPV Vaccine  Aged Out   Pt aware to contact PCP for referral to the Eye [facility]

## 2021-03-28 ENCOUNTER — Other Ambulatory Visit: Payer: Self-pay | Admitting: Internal Medicine

## 2021-03-28 DIAGNOSIS — Z1231 Encounter for screening mammogram for malignant neoplasm of breast: Secondary | ICD-10-CM

## 2021-04-22 ENCOUNTER — Other Ambulatory Visit: Payer: Self-pay

## 2021-04-23 ENCOUNTER — Other Ambulatory Visit: Payer: Self-pay

## 2021-05-13 ENCOUNTER — Other Ambulatory Visit: Payer: Self-pay

## 2021-05-13 ENCOUNTER — Ambulatory Visit
Admission: RE | Admit: 2021-05-13 | Discharge: 2021-05-13 | Disposition: A | Discharge status: Home / Self Care | Payer: Medicare PPO | Source: Ambulatory Visit | Attending: Internal Medicine | Admitting: Internal Medicine

## 2021-05-13 DIAGNOSIS — Z1231 Encounter for screening mammogram for malignant neoplasm of breast: Secondary | ICD-10-CM

## 2021-05-14 ENCOUNTER — Ambulatory Visit: Payer: Medicare PPO | Admitting: Internal Medicine

## 2021-05-22 ENCOUNTER — Other Ambulatory Visit: Payer: Self-pay

## 2021-05-27 ENCOUNTER — Other Ambulatory Visit: Payer: Self-pay

## 2021-05-28 ENCOUNTER — Other Ambulatory Visit: Payer: Self-pay

## 2021-06-06 ENCOUNTER — Other Ambulatory Visit: Payer: Self-pay

## 2021-06-07 ENCOUNTER — Other Ambulatory Visit: Payer: Self-pay

## 2021-06-18 ENCOUNTER — Other Ambulatory Visit: Payer: Self-pay

## 2021-06-20 ENCOUNTER — Other Ambulatory Visit: Payer: Self-pay

## 2021-06-24 ENCOUNTER — Other Ambulatory Visit: Payer: Self-pay

## 2021-06-25 ENCOUNTER — Other Ambulatory Visit: Payer: Self-pay

## 2021-06-27 ENCOUNTER — Ambulatory Visit: Payer: Medicare PPO | Admitting: Internal Medicine

## 2021-07-26 ENCOUNTER — Other Ambulatory Visit: Payer: Self-pay

## 2021-07-27 ENCOUNTER — Other Ambulatory Visit: Payer: Self-pay

## 2021-08-02 ENCOUNTER — Other Ambulatory Visit: Payer: Self-pay

## 2021-08-22 ENCOUNTER — Other Ambulatory Visit: Payer: Self-pay

## 2021-08-22 ENCOUNTER — Encounter: Payer: Self-pay | Admitting: Internal Medicine

## 2021-08-22 ENCOUNTER — Ambulatory Visit: Payer: Medicare PPO | Attending: Internal Medicine | Admitting: Internal Medicine

## 2021-08-22 VITALS — BP 141/88 | HR 64 | Resp 16 | Wt 296.2 lb

## 2021-08-22 DIAGNOSIS — I2510 Atherosclerotic heart disease of native coronary artery without angina pectoris: Secondary | ICD-10-CM | POA: Diagnosis not present

## 2021-08-22 DIAGNOSIS — Z2821 Immunization not carried out because of patient refusal: Secondary | ICD-10-CM | POA: Diagnosis not present

## 2021-08-22 DIAGNOSIS — E1159 Type 2 diabetes mellitus with other circulatory complications: Secondary | ICD-10-CM | POA: Diagnosis not present

## 2021-08-22 DIAGNOSIS — I10 Essential (primary) hypertension: Secondary | ICD-10-CM

## 2021-08-22 DIAGNOSIS — I5042 Chronic combined systolic (congestive) and diastolic (congestive) heart failure: Secondary | ICD-10-CM | POA: Diagnosis not present

## 2021-08-22 DIAGNOSIS — Z6841 Body Mass Index (BMI) 40.0 and over, adult: Secondary | ICD-10-CM

## 2021-08-22 LAB — POCT GLYCOSYLATED HEMOGLOBIN (HGB A1C): HbA1c, POC (controlled diabetic range): 7.5 % — AB (ref 0.0–7.0)

## 2021-08-22 LAB — GLUCOSE, POCT (MANUAL RESULT ENTRY): POC Glucose: 142 mg/dl — AB (ref 70–99)

## 2021-08-22 MED ORDER — GLIPIZIDE 5 MG PO TABS
ORAL_TABLET | Freq: Two times a day (BID) | ORAL | 1 refills | Status: DC
  Filled 2021-08-22: qty 180, fill #0
  Filled 2021-09-22: qty 180, 90d supply, fill #0
  Filled 2021-12-26: qty 180, 90d supply, fill #1

## 2021-08-22 MED ORDER — SACUBITRIL-VALSARTAN 49-51 MG PO TABS
1.0000 | ORAL_TABLET | Freq: Two times a day (BID) | ORAL | 11 refills | Status: DC
  Filled 2021-08-22: qty 60, 30d supply, fill #0

## 2021-08-22 MED ORDER — POTASSIUM CHLORIDE ER 10 MEQ PO TBCR
10.0000 meq | EXTENDED_RELEASE_TABLET | Freq: Two times a day (BID) | ORAL | 1 refills | Status: DC
  Filled 2021-08-22: qty 60, 30d supply, fill #0

## 2021-08-22 MED ORDER — AMLODIPINE BESYLATE 10 MG PO TABS
10.0000 mg | ORAL_TABLET | Freq: Every day | ORAL | 2 refills | Status: DC
  Filled 2021-08-22: qty 90, 90d supply, fill #0
  Filled 2021-11-25: qty 90, 90d supply, fill #1
  Filled 2022-02-22: qty 90, 90d supply, fill #2

## 2021-08-22 MED ORDER — FUROSEMIDE 40 MG PO TABS
ORAL_TABLET | Freq: Every day | ORAL | 1 refills | Status: DC
  Filled 2021-08-22: qty 90, fill #0
  Filled 2021-09-22: qty 90, 90d supply, fill #0
  Filled 2021-12-26: qty 90, 90d supply, fill #1

## 2021-08-22 MED ORDER — METOPROLOL SUCCINATE ER 50 MG PO TB24
ORAL_TABLET | Freq: Every day | ORAL | 1 refills | Status: DC
  Filled 2021-08-22: qty 90, 90d supply, fill #0
  Filled 2021-11-25: qty 90, 90d supply, fill #1

## 2021-08-22 NOTE — Patient Instructions (Signed)

## 2021-08-22 NOTE — Progress Notes (Signed)
Patient ID: Tracey Gallegos, female    DOB: October 26, 1955  MRN: [id number]  CC: Diabetes and Hypertension   Subjective: Tracey Gallegos is a 66 y.o. female who presents for chronic ds management Her concerns today include:  Patient with history of HTN, DM type II, CAD with DES to LT CXM, Diastolic and systolic CHF with EF of 96-22%, moderate pulmonary hypertension on echo 04/2018, morbid obesity.  DIABETES TYPE 2/Obesity Last A1C:   Results for orders placed or performed in visit on 08/22/21  POCT glucose (manual entry)  Result Value Ref Range   POC Glucose 142 (A) 70 - 99 mg/dl  POCT glycosylated hemoglobin (Hb A1C)  Result Value Ref Range   Hemoglobin A1C     HbA1c POC (<> result, manual entry)     HbA1c, POC (prediabetic range)     HbA1c, POC (controlled diabetic range) 7.5 (A) 0.0 - 7.0 %    Med Adherence:  [x]  Yes Glucotrol 5 mg BID   []  No Medication side effects:  []  Yes    [x]  No Home Monitoring?  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  Wgh up 6 lbs since last visit.  Hypoglycemic episodes?: []  Yes    [x]  No Numbness of the feet? []  Yes    [x]  No Retinopathy hx? []  Yes    []  No Last eye exam:  Comments:    HYPERTENSION/CHF/CAD Currently taking: see medication list.  She is on amlodipine, Entresto, furosemide and metoprolol Med Adherence: [x]  Yes  but out of Entresto x 1 wk Medication side effects: []  Yes    [x]  No Adherence with salt restriction: [x]  Yes    []  No Home Monitoring?: []  Yes    [x]  No - no device Monitoring Frequency:  Home BP results range:  SOB? []  Yes    [x]  No Chest Pain?: []  Yes    [x]  No Leg swelling?: []  Yes    [x]  No Headaches?: []  Yes    [x]  No Dizziness? []  Yes    [x]  No Comments:   HM:  decline flu shot.  Due for COVID  booster but declines.  Had bone density last spring. 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   Dyslipidemia, goal LDL below 70 12/09/2019   Papanicolaou smear of cervix with low risk human papillomavirus (HPV) DNA test positive 01/21/2019   Influenza vaccination declined 07/20/2018   Chronic combined systolic and diastolic CHF (congestive heart failure) ([place]) 07/20/2018   Positive depression screening 07/20/2018   CAD (coronary artery disease) 07/09/2018   Hypertensive heart disease with acute on chronic systolic congestive heart failure ([place]) 06/24/2018   Type 2 diabetes mellitus without complication, without long-term current use of insulin ([place]) 06/24/2018   Morbid obesity ([place])      Current Outpatient Medications on File Prior to Visit  Medication Sig Dispense Refill   Accu-Chek Softclix Lancets lancets Use to check blood sugar daily. E11.9 100 each 2   amLODipine (NORVASC) 10 MG tablet Take 1 tablet (10 mg total) by mouth daily. 30 tablet 6   aspirin (ASPIRIN 81) 81 MG EC tablet Take 1 tablet (81 mg total) by mouth daily. 100 tablet 2  atorvastatin (LIPITOR) 40 MG tablet TAKE 1 TABLET (40 MG TOTAL) BY MOUTH DAILY AT 6 PM. 90 tablet 3   Blood Glucose Monitoring Suppl (ACCU-CHEK GUIDE ME) w/Device KIT Use to check blood sugar daily. E11.9 1 kit 0   furosemide (LASIX) 40 MG tablet TAKE 1 TABLET (40 MG TOTAL) BY MOUTH DAILY. 90 tablet 1   glipiZIDE (GLUCOTROL) 5 MG tablet Take 1 tablet by mouth 2 (two) times daily before a meal. 180 tablet 1   glucose blood (ACCU-CHEK GUIDE) test strip USE TO CHECK BLOOD SUGAR DAILY. E11.9 100 strip 2   ibuprofen (ADVIL) 600 MG tablet Take 1 tablet (600 mg total) by mouth every 6 (six) hours as needed. 30 tablet 0   metoprolol succinate (TOPROL-XL) 50 MG 24 hr tablet TAKE 1 TABLET (50 MG TOTAL) BY MOUTH DAILY. 90 tablet 1   Multiple Vitamins-Minerals (MULTIVITAMIN WITH MINERALS)  tablet Take 1 tablet by mouth daily.     Omega-3 Fatty Acids (FISH OIL PO) Take 1,000 mg by mouth daily.      potassium chloride (KLOR-CON) 10 MEQ tablet TAKE 1 TABLET TWICE DAILY 180 tablet 1   PRENAT-FECBN-FEBISG-FA-FISHOIL PO Take by mouth.     sacubitril-valsartan (ENTRESTO) 49-51 MG Take 1 tablet by mouth 2 (two) times daily. 60 tablet 11   No current facility-administered medications on file prior to visit.    Allergies  Allergen Reactions   Lisinopril Cough    Social History   Socioeconomic History   Marital status: Single    Spouse name: Not on file   Number of children: 0   Years of education: Not on file   Highest education level: Not on file  Occupational History   Not on file  Tobacco Use   Smoking status: Never   Smokeless tobacco: Never  Vaping Use   Vaping Use: Never used  Substance and Sexual Activity   Alcohol use: Yes    Comment: occasionally   Drug use: Never   Sexual activity: Not Currently  Other Topics Concern   Not on file  Social History Narrative   Not on file   Social Determinants of Health   Financial Resource Strain: Low Risk    Difficulty of Paying Living Expenses: Not hard at all  Food Insecurity: No Food Insecurity   Worried About Charity fundraiser in the Last Year: Never true   [place] in the Last Year: Never true  Transportation Needs: No Transportation Needs   Lack of Transportation (Medical): No   Lack of Transportation (Non-Medical): No  Physical Activity: Sufficiently Active   Days of Exercise per Week: 5 days   Minutes of Exercise per Session: 80 min  Stress: No Stress Concern Present   Feeling of Stress : Not at all  Social Connections: Moderately Isolated   Frequency of Communication with Friends and Family: Three times a week   Frequency of Social Gatherings with Friends and Family: Never   Attends Religious Services: Never   Marine scientist or Organizations: Yes   Attends Archivist  Meetings: Never   Marital Status: Never married  Human resources officer Violence: Not At Risk   Fear of Current or Ex-Partner: No   Emotionally Abused: No   Physically Abused: No   Sexually Abused: No    Family History  Problem Relation Age of Onset   Diabetes Mother    Colon cancer Brother     Past Surgical History:  Procedure Laterality Date  CARDIAC CATHETERIZATION  06/28/2018   CORONARY STENT INTERVENTION N/A 06/28/2018   Procedure: CORONARY STENT INTERVENTION;  Surgeon: Leonie Man, MD;  Location: [place] [facility];  Service: Cardiovascular;  Laterality: N/A;   CORONARY STENT PLACEMENT  06/28/2018   RIGHT/LEFT HEART CATH AND CORONARY ANGIOGRAPHY N/A 06/28/2018   Procedure: RIGHT/LEFT HEART CATH AND CORONARY ANGIOGRAPHY;  Surgeon: Leonie Man, MD;  Location: [place] [facility];  Service: Cardiovascular;  Laterality: N/A;   TOTAL KNEE ARTHROPLASTY Left 05/2016    ROS: Review of Systems Negative except as stated above  PHYSICAL EXAM: BP (!) 141/88    Pulse 64    Resp 16    Wt 296 lb 3.2 oz (134.4 kg)    SpO2 97%    BMI 45.04 kg/m   Wt Readings from Last 3 Encounters:  08/22/21 296 lb 3.2 oz (134.4 kg)  01/17/21 290 lb (131.5 kg)  10/19/20 286 lb 1.6 oz (129.8 kg)    Physical Exam  General appearance - alert, well appearing, older African-American female, obese and in no distress Mental status - normal mood, behavior, speech, dress, motor activity, and thought processes Neck - supple, no significant adenopathy Chest - clear to auscultation, no wheezes, rales or rhonchi, symmetric air entry Heart - normal rate, regular rhythm, normal S1, S2, no murmurs, rubs, clicks or gallops Extremities -trace bilateral lower extremity edema. Diabetic Foot Exam - Simple   Simple Foot Form Diabetic Foot exam was performed with the following findings: Yes 08/22/2021  3:07 PM  Visual Inspection See comments: Yes Sensation Testing Intact to touch and monofilament testing  bilaterally: Yes Pulse Check Posterior Tibialis and Dorsalis pulse intact bilaterally: Yes Comments High arches BL      CMP Latest Ref Rng & Units 01/17/2021 01/31/2020 07/27/2018  Glucose 65 - 99 mg/dL 128(H) 88 141(H)  BUN 8 - 27 mg/dL $Remove'16 16 20  'vEbofMX$ Creatinine 0.57 - 1.00 mg/dL 1.00 1.11(H) 1.06(H)  Sodium 134 - 144 mmol/L 143 142 142  Potassium 3.5 - 5.2 mmol/L 5.2 4.4 4.3  Chloride 96 - 106 mmol/L 103 103 102  CO2 20 - 29 mmol/L $RemoveB'27 27 22  'NMwaPXLB$ Calcium 8.7 - 10.3 mg/dL 10.3 9.8 9.5  Total Protein 6.0 - 8.5 g/dL 7.3 7.4 6.9  Total Bilirubin 0.0 - 1.2 mg/dL 0.7 0.7 0.5  Alkaline Phos 44 - 121 IU/L 99 122(H) 128(H)  AST 0 - 40 IU/L 24 20 33  ALT 0 - 32 IU/L 17 14 33(H)   Lipid Panel     Component Value Date/Time   CHOL 174 01/17/2021 1651   TRIG 80 01/17/2021 1651   HDL 57 01/17/2021 1651   CHOLHDL 3.1 01/17/2021 1651   CHOLHDL 5.5 04/27/2018 1513   VLDL 17 04/27/2018 1513   LDLCALC 102 (H) 01/17/2021 1651    CBC    Component Value Date/Time   WBC 5.2 01/17/2021 1651   WBC 5.4 06/30/2018 0401   RBC 4.98 01/17/2021 1651   RBC 5.42 (H) 06/30/2018 0401   HGB 13.9 01/17/2021 1651   HCT 42.4 01/17/2021 1651   PLT 233 01/17/2021 1651   MCV 85 01/17/2021 1651   MCH 27.9 01/17/2021 1651   MCH 24.7 (L) 06/30/2018 0401   MCHC 32.8 01/17/2021 1651   MCHC 30.3 06/30/2018 0401   RDW 14.4 01/17/2021 1651   LYMPHSABS 1.7 07/09/2018 0937   MONOABS 0.5 06/24/2018 1305   EOSABS 0.6 (H) 07/09/2018 0937   BASOSABS 0.1 07/09/2018 0937    ASSESSMENT  AND PLAN:  1. Type 2 diabetes mellitus with other circulatory complication, without long-term current use of insulin (HCC) Not at goal. Advised that sometimes she will get off track with her eating habits but encouraged her to get back on track.  Encouraged her to move more.  We discussed adding another medication versus her trying to improve on eating habits and moving more to get her A1c below 7.  She prefers to do that rather than adding  another medication. - POCT glucose (manual entry) - POCT glycosylated hemoglobin (Hb A1C) - glipiZIDE (GLUCOTROL) 5 MG tablet; Take 1 tablet by mouth 2 (two) times daily before a meal.  Dispense: 180 tablet; Refill: 1  2. Essential hypertension Not at goal.  However she has been out of Entresto.  Refill sent on that. - amLODipine (NORVASC) 10 MG tablet; Take 1 tablet (10 mg total) by mouth daily.  Dispense: 90 tablet; Refill: 2  3. Chronic combined systolic and diastolic CHF (congestive heart failure) ([place]) Compensated.  Continue current medications including metoprolol, Entresto.  We will arrange for follow-up with Dr. Debara Pickett since she has not seen him in over a year. - furosemide (LASIX) 40 MG tablet; TAKE 1 TABLET (40 MG TOTAL) BY MOUTH DAILY.  Dispense: 90 tablet; Refill: 1 - potassium chloride (KLOR-CON) 10 MEQ tablet; Take 1 tablet (10 mEq total) by mouth 2 (two) times daily.  Dispense: 180 tablet; Refill: 1 - Ambulatory referral to Cardiology  4. Coronary artery disease involving native coronary artery of native heart without angina pectoris Stable.  Continue metoprolol, aspirin and atorvastatin - metoprolol succinate (TOPROL-XL) 50 MG 24 hr tablet; TAKE 1 TABLET (50 MG TOTAL) BY MOUTH DAILY.  Dispense: 90 tablet; Refill: 1 - Ambulatory referral to Cardiology  5. Morbid obesity with BMI of 45.0-49.9, adult ([place]) See #1 above.  6. Influenza vaccination declined   7. COVID-19 vaccine dose declined    Patient was given the opportunity to ask questions.  Patient verbalized understanding of the plan and was able to repeat key elements of the plan.   Orders Placed This Encounter  Procedures   POCT glucose (manual entry)   POCT glycosylated hemoglobin (Hb A1C)     Requested Prescriptions    No prescriptions requested or ordered in this encounter    No follow-ups on file.  Karle Plumber, MD, FACP

## 2021-08-28 ENCOUNTER — Other Ambulatory Visit: Payer: Self-pay

## 2021-09-03 ENCOUNTER — Other Ambulatory Visit: Payer: Self-pay

## 2021-09-03 MED ORDER — SACUBITRIL-VALSARTAN 49-51 MG PO TABS: 1.0000 | ORAL_TABLET | Freq: Two times a day (BID) | ORAL | 3 refills | Status: DC

## 2021-09-05 ENCOUNTER — Encounter: Payer: Self-pay | Admitting: Internal Medicine

## 2021-09-05 ENCOUNTER — Other Ambulatory Visit: Payer: Self-pay

## 2021-09-05 ENCOUNTER — Ambulatory Visit: Payer: Medicare PPO | Admitting: Internal Medicine

## 2021-09-05 VITALS — BP 154/96 | HR 80 | Ht 68.0 in | Wt 299.0 lb

## 2021-09-05 DIAGNOSIS — I255 Ischemic cardiomyopathy: Secondary | ICD-10-CM | POA: Diagnosis not present

## 2021-09-05 DIAGNOSIS — E785 Hyperlipidemia, unspecified: Secondary | ICD-10-CM

## 2021-09-05 DIAGNOSIS — I10 Essential (primary) hypertension: Secondary | ICD-10-CM | POA: Diagnosis not present

## 2021-09-05 DIAGNOSIS — I5042 Chronic combined systolic (congestive) and diastolic (congestive) heart failure: Secondary | ICD-10-CM

## 2021-09-05 NOTE — Progress Notes (Signed)
OFFICE NOTE  Chief Complaint:  Follow-up heart failure  Primary Care Physician: Ladell Pier, MD  HPI:  Tracey Gallegos is a 66 y.o. female with a past medial history significant for congestive heart failure with a diagnosis of acute systolic congestive heart failure in 2006 in [place].  The details are not totally clear however she said she was living in [place] at the time.  She may have had work-up in [facility].  We will try to obtain records from her primary care provider.  She said that she was treated with diuretics and that she did have a stress test which is apparently negative for ischemia.  Subsequently she has been noncompliant with medications and is moved around, ultimately recently moving to [place].  She was admitted for acute on chronic systolic congestive heart failure.  An echo showed EF of 25 to 30% with severe global hypokinesis and grade 3 diastolic dysfunction.  There was mild mitral regurgitation, severe left atrial enlargement and moderate pulmonary hypertension with an RVSP of 66 mmHg.  She was diuresed and is down to about 5 kg since discharge.  He reports persistent, dry cough.  She is on lisinopril but says she is taken in the past.  She denies any chest pain.  She does not have a local primary care provider but was encouraged to establish with community health and wellness.  07/07/2018  Tracey Gallegos returns today for follow-up.  Unfortunately she was recently hospitalized for acute on chronic systolic congestive heart failure.  I saw her during that hospitalization and had recommended cardiac catheterization.  Because of her cardiomyopathy and ongoing symptoms, I was concerned about a possible ischemic etiology.  Did undergo left and right heart catheterization on 06/28/2018.  This demonstrated a proximal to mid circumflex and first marginal lesion, treated with 2 Xience [place] drug-eluting stents.  She remained in decompensated heart failure and was  additionally diuresed.  At discharge she is doing much better.  Her weight is down, blood pressure is well controlled and her breathing is normal.  She denies any chest pain.  12/09/2019  Tracey Gallegos is seen today in follow-up.  She continues to do well.  She denies any worsening shortness of breath.  There is no chest pain.  The weight is up only a pound since November however she was at 264 pounds in June 2020.  She denies any new swelling.  She is compliant with the Entresto.  For now it is affordable for her.  I decreased her Lasix which she seems to be tolerating.  LVEF had normalized based on her last echo.  Today is also normal and blood pressure is much better controlled.  09/05/2021  Tracey Gallegos is seen today for follow-up of heart failure.  She recently saw [name].  She says she has been out of her Delene Loll for about a month.  She has had some worsening edema but also weight gain.  She was felt to be euvolemic in her primary care provider's office.  She reports that she does not get the shortness of breath she used to when her LVEF had normalized that was last by echo in 2020.  Blood pressure was elevated today but again she has been out of her Entresto.  She does stay on amlodipine and has been on metoprolol and Lasix.  Cost has been an issue for the [facility] and she was initially getting it free with paperwork but has reapplied and not heard about that.  PMHx:  Past Medical History:  Diagnosis Date   CHF (congestive heart failure) ([place])    Coronary artery disease    Diabetes mellitus without complication ([place])    Dyspnea    Hypertension     Past Surgical History:  Procedure Laterality Date   CARDIAC CATHETERIZATION  06/28/2018   CORONARY STENT INTERVENTION N/A 06/28/2018   Procedure: CORONARY STENT INTERVENTION;  Surgeon: Leonie Man, MD;  Location: [facility];  Service: Cardiovascular;  Laterality: N/A;   CORONARY STENT PLACEMENT  06/28/2018   RIGHT/LEFT HEART  CATH AND CORONARY ANGIOGRAPHY N/A 06/28/2018   Procedure: RIGHT/LEFT HEART CATH AND CORONARY ANGIOGRAPHY;  Surgeon: Leonie Man, MD;  Location: [facility];  Service: Cardiovascular;  Laterality: N/A;   TOTAL KNEE ARTHROPLASTY Left 05/2016    FAMHx:  Family History  Problem Relation Age of Onset   Diabetes Mother    Colon cancer Brother     SOCHx:   reports that she has never smoked. She has never used smokeless tobacco. She reports current alcohol use. She reports that she does not use drugs.  ALLERGIES:  Allergies  Allergen Reactions   Lisinopril Cough    ROS: Pertinent items noted in HPI and remainder of comprehensive ROS otherwise negative.  HOME MEDS: Current Outpatient Medications on File Prior to Visit  Medication Sig Dispense Refill   Accu-Chek Softclix Lancets lancets Use to check blood sugar daily. E11.9 100 each 2   amLODipine (NORVASC) 10 MG tablet Take 1 tablet (10 mg total) by mouth daily. 90 tablet 2   aspirin (ASPIRIN 81) 81 MG EC tablet Take 1 tablet (81 mg total) by mouth daily. 100 tablet 2   atorvastatin (LIPITOR) 40 MG tablet TAKE 1 TABLET (40 MG TOTAL) BY MOUTH DAILY AT 6 PM. 90 tablet 3   Blood Glucose Monitoring Suppl (ACCU-CHEK GUIDE ME) w/Device KIT Use to check blood sugar daily. E11.9 1 kit 0   furosemide (LASIX) 40 MG tablet TAKE 1 TABLET (40 MG TOTAL) BY MOUTH DAILY. 90 tablet 1   glipiZIDE (GLUCOTROL) 5 MG tablet Take 1 tablet by mouth 2 (two) times daily before a meal. 180 tablet 1   glucose blood (ACCU-CHEK GUIDE) test strip USE TO CHECK BLOOD SUGAR DAILY. E11.9 100 strip 2   ibuprofen (ADVIL) 600 MG tablet Take 1 tablet (600 mg total) by mouth every 6 (six) hours as needed. 30 tablet 0   metoprolol succinate (TOPROL-XL) 50 MG 24 hr tablet TAKE 1 TABLET (50 MG TOTAL) BY MOUTH DAILY. 90 tablet 1   Multiple Vitamins-Minerals (MULTIVITAMIN WITH MINERALS) tablet Take 1 tablet by mouth daily.     Omega-3 Fatty Acids (FISH OIL PO) Take 1,000  mg by mouth daily.      potassium chloride (KLOR-CON) 10 MEQ tablet Take 1 tablet (10 mEq total) by mouth 2 (two) times daily. 180 tablet 1   PRENAT-FECBN-FEBISG-FA-FISHOIL PO Take by mouth.     sacubitril-valsartan (ENTRESTO) 49-51 MG Take 1 tablet by mouth 2 (two) times daily. 180 tablet 3   No current facility-administered medications on file prior to visit.    LABS/IMAGING: No results found for this or any previous visit (from the past 48 hour(s)). No results found.  LIPID PANEL:    Component Value Date/Time   CHOL 174 01/17/2021 1651   TRIG 80 01/17/2021 1651   HDL 57 01/17/2021 1651   CHOLHDL 3.1 01/17/2021 1651   CHOLHDL 5.5 04/27/2018 1513   VLDL 17 04/27/2018 1513  [place] 102 (H) 01/17/2021 1651     WEIGHTS: Wt Readings from Last 3 Encounters:  09/05/21 299 lb (135.6 kg)  08/22/21 296 lb 3.2 oz (134.4 kg)  01/17/21 290 lb (131.5 kg)    VITALS: BP (!) 154/96 (BP Location: Right Arm, Patient Position: Sitting, Cuff Size: Large)    Pulse 80    Ht 5' 8" (1.727 m)    Wt 299 lb (135.6 kg)    BMI 45.46 kg/m   EXAM: General appearance: alert, no distress, and morbidly obese Neck: no carotid bruit, no JVD, and thyroid not enlarged, symmetric, no tenderness/mass/nodules Lungs: clear to auscultation bilaterally Heart: regular rate and rhythm Abdomen: soft, non-tender; bowel sounds normal; no masses,  no organomegaly Extremities: edema trace bilateral ankle Pulses: 2+ and symmetric Skin: Skin color, texture, turgor normal. No rashes or lesions Neurologic: Grossly normal Psych: Pleasant  EKG: Normal sinus rhythm at 80-personally reviewed  ASSESSMENT: Coronary artery disease status post DES x2 to the first marginal and circumflex arteries (06/2018) Acute on chronic systolic congestive heart failure, LVEF 25 to 30% (04/2018) -> LVEF ~55% (04/2019) NYHA class I symptoms Morbid obesity Poorly controlled hypertension History of medication noncompliance -  improved Dyslipidemia  PLAN: 1.   Tracey Gallegos seems to be stable without any worsening heart failure symptoms although she has had some leg edema.  Blood pressure is elevated.  She was previously on the 49/51 mg twice daily Entresto dose but ran out of the medicine about a month ago.  Cost has been an issue but she is reapplied for patient assistance.  In the meantime we will try to provide samples if we have them for at least a couple weeks and hopefully she will be able to get approval.  I advised her to contact us if she has difficulty getting approval for the medicine.  We will schedule an earlier follow-up in 6 months and she has a follow-up with her PCP in June.  Pixie Casino, MD, [facility], [place] Director of the Advanced Lipid Disorders &  Cardiovascular Risk Reduction Clinic Diplomate of the American Board of Clinical Lipidology Attending Cardiologist  Direct Dial: [phone]   Fax: [phone]  Website:  [email or web address] 09/05/2021, 8:31 AM

## 2021-09-05 NOTE — Patient Instructions (Signed)
°  Follow-Up: At [facility], you and your health needs are our priority.  As part of our continuing mission to provide you with exceptional heart care, we have created designated Provider Care Teams.  These Care Teams include your primary Cardiologist (physician) and Advanced Practice Providers (APPs -  Physician Assistants and Nurse Practitioners) who all work together to provide you with the care you need, when you need it.  We recommend signing up for the patient portal called "MyChart".  Sign up information is provided on this After Visit Summary.  MyChart is used to connect with patients for Virtual Visits (Telemedicine).  Patients are able to view lab/test results, encounter notes, upcoming appointments, etc.  Non-urgent messages can be sent to your provider as well.   To learn more about what you can do with MyChart, go to [email or web address].    Your next appointment:   6 month(s)  The format for your next appointment:   In Person  Provider:   Pixie Casino, MD

## 2021-09-12 ENCOUNTER — Other Ambulatory Visit: Payer: Self-pay

## 2021-09-16 ENCOUNTER — Other Ambulatory Visit: Payer: Self-pay

## 2021-09-23 ENCOUNTER — Other Ambulatory Visit: Payer: Self-pay

## 2021-09-24 ENCOUNTER — Other Ambulatory Visit: Payer: Self-pay

## 2021-09-26 ENCOUNTER — Other Ambulatory Visit: Payer: Self-pay

## 2021-09-27 ENCOUNTER — Other Ambulatory Visit: Payer: Self-pay

## 2021-09-30 ENCOUNTER — Other Ambulatory Visit: Payer: Self-pay | Admitting: Internal Medicine

## 2021-09-30 DIAGNOSIS — I5042 Chronic combined systolic (congestive) and diastolic (congestive) heart failure: Secondary | ICD-10-CM

## 2021-10-01 NOTE — Telephone Encounter (Signed)
Requested Prescriptions  ?Pending Prescriptions Disp Refills  ?? potassium chloride (KLOR-CON) 10 MEQ tablet [Pharmacy Med Name: POTASSIUM CHLORIDE ER 10 MEQ Tablet Extended Release] 180 tablet 1  ?  Sig: TAKE 1 TABLET TWICE DAILY  ?  ? Endocrinology:  Minerals - Potassium Supplementation Passed - 09/30/2021 11:20 AM  ?  ?  Passed - K in normal range and within 360 days  ?  Potassium  ?Date Value Ref Range Status  ?01/17/2021 5.2 3.5 - 5.2 mmol/L Final  ?   ?  ?  Passed - Cr in normal range and within 360 days  ?  Creatinine, Ser  ?Date Value Ref Range Status  ?01/17/2021 1.00 0.57 - 1.00 mg/dL Final  ?   ?  ?  Passed - Valid encounter within last 12 months  ?  Recent Outpatient Visits   ?      ? 1 month ago Type 2 diabetes mellitus with other circulatory complication, without long-term current use of insulin ([place])  ? [place] Ladell Pier, MD  ? 8 months ago Type 2 diabetes mellitus with other circulatory complication, without long-term current use of insulin ([place])  ? [place] Karle Plumber B, MD  ? 1 year ago Pap smear for cervical cancer screening  ? [place] Ladell Pier, MD  ? 1 year ago Essential hypertension  ? [place] Ladell Pier, MD  ? 1 year ago Essential hypertension  ? [place], RPH-CPP  ?  ?  ?Future Appointments   ?        ? In 2 months Ladell Pier, MD [place]  ?  ? ?  ?  ?  ? ?

## 2021-11-25 ENCOUNTER — Other Ambulatory Visit: Payer: Self-pay

## 2021-11-27 ENCOUNTER — Other Ambulatory Visit: Payer: Self-pay

## 2021-12-20 ENCOUNTER — Other Ambulatory Visit: Payer: Self-pay

## 2021-12-20 ENCOUNTER — Encounter: Payer: Self-pay | Admitting: Internal Medicine

## 2021-12-20 ENCOUNTER — Ambulatory Visit: Payer: Medicare PPO | Attending: Internal Medicine | Admitting: Internal Medicine

## 2021-12-20 VITALS — BP 139/86 | HR 72 | Temp 97.9°F | Resp 16 | Wt 293.0 lb

## 2021-12-20 DIAGNOSIS — I10 Essential (primary) hypertension: Secondary | ICD-10-CM

## 2021-12-20 DIAGNOSIS — G4489 Other headache syndrome: Secondary | ICD-10-CM | POA: Diagnosis not present

## 2021-12-20 DIAGNOSIS — M542 Cervicalgia: Secondary | ICD-10-CM

## 2021-12-20 DIAGNOSIS — E1159 Type 2 diabetes mellitus with other circulatory complications: Secondary | ICD-10-CM

## 2021-12-20 DIAGNOSIS — I2510 Atherosclerotic heart disease of native coronary artery without angina pectoris: Secondary | ICD-10-CM | POA: Diagnosis not present

## 2021-12-20 DIAGNOSIS — D1720 Benign lipomatous neoplasm of skin and subcutaneous tissue of unspecified limb: Secondary | ICD-10-CM

## 2021-12-20 LAB — POCT GLYCOSYLATED HEMOGLOBIN (HGB A1C): HbA1c, POC (controlled diabetic range): 7.5 % — AB (ref 0.0–7.0)

## 2021-12-20 LAB — GLUCOSE, POCT (MANUAL RESULT ENTRY): POC Glucose: 129 mg/dl — AB (ref 70–99)

## 2021-12-20 MED ORDER — METOPROLOL SUCCINATE ER 50 MG PO TB24
ORAL_TABLET | Freq: Every day | ORAL | 1 refills | Status: DC
  Filled 2021-12-20: qty 90, 90d supply, fill #0

## 2021-12-20 MED ORDER — DICLOFENAC SODIUM 1 % EX GEL
2.0000 g | Freq: Four times a day (QID) | CUTANEOUS | 1 refills | Status: DC
  Filled 2021-12-20: qty 100, 13d supply, fill #0
  Filled 2022-02-22 – 2022-09-05 (×2): qty 100, 13d supply, fill #1

## 2021-12-20 MED ORDER — BUTALBITAL-APAP-CAFFEINE 50-325-40 MG PO TABS
1.0000 | ORAL_TABLET | Freq: Two times a day (BID) | ORAL | 0 refills | Status: AC | PRN
  Filled 2021-12-20 (×2): qty 20, 5d supply, fill #0

## 2021-12-20 NOTE — Progress Notes (Unsigned)
Patient ID: Tracey Gallegos, female    DOB: 1956/06/18  MRN: [id number]  CC: Neck Pain and Diabetes   Subjective: Tracey Gallegos is a 66 y.o. female who presents for chronic ds managment Her concerns today include:  Patient with history of HTN, DM type II, CAD with DES to LT CXM, Diastolic and systolic CHF with EF of 34-19%, moderate pulmonary hypertension on echo 04/2018, morbid obesity.  C/o pain in neck that starts in lower cervical spine and radiates to upper spine mainly on LT side and extends towards LT shoulder (points to trapezius area) x 3-4 wks. no initiating factors.  No radiation down the left arm, numbness or tingling.  No weakness in the left arm.  Not as bad in the mornings but increases during the day.  Worse with movement of the neck to the left side. Associated with headaches in the occipital area x2 to 3 weeks.  Headaches are constant but more intense at the end of the day.  No associated blurred vision, photophobia, nausea/vomiting.  She has been taking extra aspirins, sometimes Tylenol and ibuprofen for the headache and neck pain.  She takes the pain medication mainly at bedtime to help her sleep.  So far she has not found any of them to be helpful. -Reports having a mass above the left shoulder which she has had for over 20 years.  It is not bothersome to her and has not increased in size over the past several years.  DM: Results for orders placed or performed in visit on 12/20/21  CBC  Result Value Ref Range   WBC 5.4 3.4 - 10.8 x10E3/uL   RBC 5.25 3.77 - 5.28 x10E6/uL   Hemoglobin 14.8 11.1 - 15.9 g/dL   Hematocrit 44.5 34.0 - 46.6 %   MCV 85 79 - 97 fL   MCH 28.2 26.6 - 33.0 pg   MCHC 33.3 31.5 - 35.7 g/dL   RDW 14.0 11.7 - 15.4 %   Platelets 238 150 - 450 x10E3/uL  Comprehensive metabolic panel  Result Value Ref Range   Glucose 135 (H) 70 - 99 mg/dL   BUN 16 8 - 27 mg/dL   Creatinine, Ser 0.97 0.57 - 1.00 mg/dL   eGFR 64 >59 mL/min/1.73   BUN/Creatinine Ratio 16  12 - 28   Sodium 144 134 - 144 mmol/L   Potassium 4.9 3.5 - 5.2 mmol/L   Chloride 103 96 - 106 mmol/L   CO2 24 20 - 29 mmol/L   Calcium 9.9 8.7 - 10.3 mg/dL   Total Protein 7.6 6.0 - 8.5 g/dL   Albumin 4.9 (H) 3.8 - 4.8 g/dL   Globulin, Total 2.7 1.5 - 4.5 g/dL   Albumin/Globulin Ratio 1.8 1.2 - 2.2   Bilirubin Total 0.5 0.0 - 1.2 mg/dL   Alkaline Phosphatase 115 44 - 121 IU/L   AST 26 0 - 40 IU/L   ALT 18 0 - 32 IU/L  Lipid panel  Result Value Ref Range   Cholesterol, Total 194 100 - 199 mg/dL   Triglycerides 98 0 - 149 mg/dL   HDL 55 >39 mg/dL   VLDL Cholesterol Cal 18 5 - 40 mg/dL   LDL Chol Calc (NIH) 121 (H) 0 - 99 mg/dL   Chol/HDL Ratio 3.5 0.0 - 4.4 ratio  HgB A1c  Result Value Ref Range   Hemoglobin A1C     HbA1c POC (<> result, manual entry)     HbA1c, POC (prediabetic range)  HbA1c, POC (controlled diabetic range) 7.5 (A) 0.0 - 7.0 %  Glucose (CBG)  Result Value Ref Range   POC Glucose 129 (A) 70 - 99 mg/dl  Checks blood sugars once a day in the mornings.  Usually in the 140s.  Reports compliance with taking Glucotrol twice a day.  Doing better with her eating habits.  HTN/CAD: Reports compliance with Norvasc 10 mg, Lipitor 40 mg, metoprolol 50 mg, ASA all daily, Entresto 49/51 mg twice a day and furosemide No chest pains or shortness of breath.  Not checking blood pressure.  Tries to limit salt in the foods.  HM: She declines Shingrix.  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   Dyslipidemia, goal LDL below 70 12/09/2019   Papanicolaou smear of cervix with low risk human papillomavirus (HPV) DNA test positive 01/21/2019   Influenza vaccination declined 07/20/2018   Chronic combined systolic and diastolic CHF (congestive heart failure) ([place]) 07/20/2018   Positive depression screening 07/20/2018   CAD (coronary artery disease) 07/09/2018   Hypertensive heart disease with acute on  chronic systolic congestive heart failure ([place]) 06/24/2018   Type 2 diabetes mellitus without complication, without long-term current use of insulin ([place]) 06/24/2018   Morbid obesity ([place])      Current Outpatient Medications on File Prior to Visit  Medication Sig Dispense Refill   Accu-Chek Softclix Lancets lancets Use to check blood sugar daily. E11.9 100 each 2   amLODipine (NORVASC) 10 MG tablet Take 1 tablet (10 mg total) by mouth daily. 90 tablet 2   aspirin (ASPIRIN 81) 81 MG EC tablet Take 1 tablet (81 mg total) by mouth daily. 100 tablet 2   atorvastatin (LIPITOR) 40 MG tablet TAKE 1 TABLET (40 MG TOTAL) BY MOUTH DAILY AT 6 PM. 90 tablet 3   Blood Glucose Monitoring Suppl (ACCU-CHEK GUIDE ME) w/Device KIT Use to check blood sugar daily. E11.9 1 kit 0   furosemide (LASIX) 40 MG tablet TAKE 1 TABLET (40 MG TOTAL) BY MOUTH DAILY. 90 tablet 1   glipiZIDE (GLUCOTROL) 5 MG tablet Take 1 tablet by mouth 2 (two) times daily before a meal. 180 tablet 1   glucose blood (ACCU-CHEK GUIDE) test strip USE TO CHECK BLOOD SUGAR DAILY. E11.9 100 strip 2   Multiple Vitamins-Minerals (MULTIVITAMIN WITH MINERALS) tablet Take 1 tablet by mouth daily.     Omega-3 Fatty Acids (FISH OIL PO) Take 1,000 mg by mouth daily.      potassium chloride (KLOR-CON) 10 MEQ tablet TAKE 1 TABLET TWICE DAILY 180 tablet 1   PRENAT-FECBN-FEBISG-FA-FISHOIL PO Take by mouth.     sacubitril-valsartan (ENTRESTO) 49-51 MG Take 1 tablet by mouth 2 (two) times daily. 180 tablet 3   No current facility-administered medications on file prior to visit.    Allergies  Allergen Reactions   Lisinopril Cough    Social History   Socioeconomic History   Marital status: Single    Spouse name: Not on file   Number of children: 0   Years of education: Not on file   Highest education level: Not on file  Occupational History   Not on file  Tobacco Use   Smoking status: Never   Smokeless tobacco: Never  Vaping Use   Vaping Use:  Never used  Substance and Sexual Activity   Alcohol use: Yes    Comment: occasionally   Drug use: Never   Sexual activity: Not Currently  Other Topics Concern   Not on file  Social History Narrative   Not on file   Social Determinants of Health   Financial Resource Strain: Low Risk    Difficulty of Paying Living Expenses: Not hard at all  Food Insecurity: No Food Insecurity   Worried About Charity fundraiser in the Last Year: Never true   [place] in the Last Year: Never true  Transportation Needs: No Transportation Needs   Lack of Transportation (Medical): No   Lack of Transportation (Non-Medical): No  Physical Activity: Sufficiently Active   Days of Exercise per Week: 5 days   Minutes of Exercise per Session: 80 min  Stress: No Stress Concern Present   Feeling of Stress : Not at all  Social Connections: Moderately Isolated   Frequency of Communication with Friends and Family: Three times a week   Frequency of Social Gatherings with Friends and Family: Never   Attends Religious Services: Never   Marine scientist or Organizations: Yes   Attends Archivist Meetings: Never   Marital Status: Never married  Human resources officer Violence: Not At Risk   Fear of Current or Ex-Partner: No   Emotionally Abused: No   Physically Abused: No   Sexually Abused: No    Family History  Problem Relation Age of Onset   Diabetes Mother    Colon cancer Brother     Past Surgical History:  Procedure Laterality Date   CARDIAC CATHETERIZATION  06/28/2018   CORONARY STENT INTERVENTION N/A 06/28/2018   Procedure: CORONARY STENT INTERVENTION;  Surgeon: Leonie Man, MD;  Location: [facility];  Service: Cardiovascular;  Laterality: N/A;   CORONARY STENT PLACEMENT  06/28/2018   RIGHT/LEFT HEART CATH AND CORONARY ANGIOGRAPHY N/A 06/28/2018   Procedure: RIGHT/LEFT HEART CATH AND CORONARY ANGIOGRAPHY;  Surgeon: Leonie Man, MD;  Location: [facility];   Service: Cardiovascular;  Laterality: N/A;   TOTAL KNEE ARTHROPLASTY Left 05/2016    ROS: Review of Systems Negative except as stated above  PHYSICAL EXAM: BP 139/86 (BP Location: Left Arm, Patient Position: Sitting, Cuff Size: Large)   Pulse 72   Temp 97.9 F (36.6 C) (Oral)   Resp 16   Wt 293 lb (132.9 kg)   SpO2 96%   BMI 44.55 kg/m   Physical Exam Repeat BP 143/88 General appearance - alert, well appearing, older African-American female and in no distress Mental status - normal mood, behavior, speech, dress, motor activity, and thought processes Chest - clear to auscultation, no wheezes, rales or rhonchi, symmetric air entry Heart - normal rate, regular rhythm, normal S1, S2, no murmurs, rubs, clicks or gallops Neurological -cranial nerves grossly intact.  Good alternating hand movements.  Romberg positive.  Power:  upper extremities 5/5 bilaterally.  Power in the lower extremities 5/5 bilaterally.  Gait is slow but stable.  Gross sensation intact. Musculoskeletal -no tenderness on palpation of the cervical spine.  Good flexion and extension of the neck.  Mild discomfort on rotation of the neck to the left side.  No tenderness on palpation over the trapezius muscle on either side. Extremities - peripheral pulses normal, no pedal edema, no clubbing or cyanosis Skin: 10 x 8 cm sole of movable mass noted above and medial to the left glenohumeral joint.  It is nontender to touch.     Latest Ref Rng & Units 12/20/2021    4:43 PM 01/17/2021    4:51 PM 01/31/2020  4:20 PM  CMP  Glucose 70 - 99 mg/dL 135   128   88    BUN 8 - 27 mg/dL $Remove'16   16   16    'fFPLpBI$ Creatinine 0.57 - 1.00 mg/dL 0.97   1.00   1.11    Sodium 134 - 144 mmol/L 144   143   142    Potassium 3.5 - 5.2 mmol/L 4.9   5.2   4.4    Chloride 96 - 106 mmol/L 103   103   103    CO2 20 - 29 mmol/L $RemoveB'24   27   27    'hnjMrnxO$ Calcium 8.7 - 10.3 mg/dL 9.9   10.3   9.8    Total Protein 6.0 - 8.5 g/dL 7.6   7.3   7.4    Total Bilirubin 0.0 -  1.2 mg/dL 0.5   0.7   0.7    Alkaline Phos 44 - 121 IU/L 115   99   122    AST 0 - 40 IU/L $Remov'26   24   20    'sYHFju$ ALT 0 - 32 IU/L $Remov'18   17   14     'ZCFgJT$ Lipid Panel     Component Value Date/Time   CHOL 194 12/20/2021 1643   TRIG 98 12/20/2021 1643   HDL 55 12/20/2021 1643   CHOLHDL 3.5 12/20/2021 1643   CHOLHDL 5.5 04/27/2018 1513   VLDL 17 04/27/2018 1513   LDLCALC 121 (H) 12/20/2021 1643    CBC    Component Value Date/Time   WBC 5.4 12/20/2021 1643   WBC 5.4 06/30/2018 0401   RBC 5.25 12/20/2021 1643   RBC 5.42 (H) 06/30/2018 0401   HGB 14.8 12/20/2021 1643   HCT 44.5 12/20/2021 1643   PLT 238 12/20/2021 1643   MCV 85 12/20/2021 1643   MCH 28.2 12/20/2021 1643   MCH 24.7 (L) 06/30/2018 0401   MCHC 33.3 12/20/2021 1643   MCHC 30.3 06/30/2018 0401   RDW 14.0 12/20/2021 1643   LYMPHSABS 1.7 07/09/2018 0937   MONOABS 0.5 06/24/2018 1305   EOSABS 0.6 (H) 07/09/2018 0937   BASOSABS 0.1 07/09/2018 0937    ASSESSMENT AND PLAN:  1. Acute neck pain Likely MSK in nature.  Since it has been persistent for several wks, will start with plan x-ray to eval for arthritis.  If negative and symptoms persist, may need MRI to eval for spinal stenosis or nerve compression.  Doubt pain is being caused by lipoma located over LT shoulder. Stop Ibuprofen.  Advised against taking extra aspirins.  use Voltaren Gel - DG Cervical Spine Complete; Future - diclofenac Sodium (VOLTAREN) 1 % GEL; Apply 2 g topically 4 (four) times daily.  Dispense: 100 g; Refill: 1  2. Headache syndrome Worrisome in pt over the age of 22.  Will need advanced imaging We will put her on Fioricet to use as needed in the meantime.  Advised that the medication can be habit-forming and to use only when needed.  [place] controlled substance reporting system reviewed. - Ambulatory referral to Neurology - MR Brain W Wo Contrast; Future  3. Type 2 diabetes mellitus with other circulatory complication, without long-term current  use of insulin (HCC) Not at goal.  We decided that she would continue her current dose of Glucotrol.  Continue to work on eating habits - HgB A1c - Glucose (CBG) - Ambulatory referral to Ophthalmology - Lipid panel  4. Essential hypertension Not at goal.  Increase  metoprolol to 75 mg daily.  Continue current dose of amlodipine, Entresto, furosemide. - CBC - Comprehensive metabolic panel  5. Coronary artery disease involving native coronary artery of native heart without angina pectoris Stable.  Continue aspirin, atorvastatin, metoprolol. - metoprolol succinate (TOPROL-XL) 50 MG 24 hr tablet; TAKE 1.5 TABLET (75 MG TOTAL) BY MOUTH DAILY.  Dispense: 135 tablet; Refill: 1  6. Lipoma of upper extremity, unspecified laterality We will continue to observe.   Advised to let me know if this increases in size.   Patient was given the opportunity to ask questions.  Patient verbalized understanding of the plan and was able to repeat key elements of the plan.   This documentation was completed using Radio producer.  Any transcriptional errors are unintentional.  Orders Placed This Encounter  Procedures   MR Brain W Wo Contrast   DG Cervical Spine Complete   CBC   Comprehensive metabolic panel   Lipid panel   Ambulatory referral to Ophthalmology   Ambulatory referral to Neurology   HgB A1c   Glucose (CBG)     Requested Prescriptions   Signed Prescriptions Disp Refills   butalbital-acetaminophen-caffeine (FIORICET) 50-325-40 MG tablet 20 tablet 0    Sig: Take 1-2 tablets by mouth 2 (two) times daily as needed for headache.   diclofenac Sodium (VOLTAREN) 1 % GEL 100 g 1    Sig: Apply 2 g topically 4 (four) times daily.   metoprolol succinate (TOPROL-XL) 50 MG 24 hr tablet 135 tablet 1    Sig: Take 1.5 tablets (75 mg total) by mouth daily.    Return in about 4 weeks (around 01/17/2022).  Karle Plumber, MD, FACP

## 2021-12-20 NOTE — Progress Notes (Unsigned)
C/o neck pain x 1 month  Taking OTC medication, pain 7/10

## 2021-12-21 ENCOUNTER — Other Ambulatory Visit (HOSPITAL_COMMUNITY): Payer: Self-pay

## 2021-12-21 LAB — COMPREHENSIVE METABOLIC PANEL
ALT: 18 IU/L (ref 0–32)
AST: 26 IU/L (ref 0–40)
Albumin/Globulin Ratio: 1.8 (ref 1.2–2.2)
Albumin: 4.9 g/dL — ABNORMAL HIGH (ref 3.8–4.8)
Alkaline Phosphatase: 115 IU/L (ref 44–121)
BUN/Creatinine Ratio: 16 (ref 12–28)
BUN: 16 mg/dL (ref 8–27)
Bilirubin Total: 0.5 mg/dL (ref 0.0–1.2)
CO2: 24 mmol/L (ref 20–29)
Calcium: 9.9 mg/dL (ref 8.7–10.3)
Chloride: 103 mmol/L (ref 96–106)
Creatinine, Ser: 0.97 mg/dL (ref 0.57–1.00)
Globulin, Total: 2.7 g/dL (ref 1.5–4.5)
Glucose: 135 mg/dL — ABNORMAL HIGH (ref 70–99)
Potassium: 4.9 mmol/L (ref 3.5–5.2)
Sodium: 144 mmol/L (ref 134–144)
Total Protein: 7.6 g/dL (ref 6.0–8.5)
eGFR: 64 mL/min/{1.73_m2} (ref >59)

## 2021-12-21 LAB — CBC
Hematocrit: 44.5 % (ref 34.0–46.6)
Hemoglobin: 14.8 g/dL (ref 11.1–15.9)
MCH: 28.2 pg (ref 26.6–33.0)
MCHC: 33.3 g/dL (ref 31.5–35.7)
MCV: 85 fL (ref 79–97)
Platelets: 238 10*3/uL (ref 150–450)
RBC: 5.25 x10E6/uL (ref 3.77–5.28)
RDW: 14 % (ref 11.7–15.4)
WBC: 5.4 10*3/uL (ref 3.4–10.8)

## 2021-12-21 LAB — LIPID PANEL
Chol/HDL Ratio: 3.5 ratio (ref 0.0–4.4)
Cholesterol, Total: 194 mg/dL (ref 100–199)
HDL: 55 mg/dL (ref >39)
LDL Chol Calc (NIH): 121 mg/dL — ABNORMAL HIGH (ref 0–99)
Triglycerides: 98 mg/dL (ref 0–149)
VLDL Cholesterol Cal: 18 mg/dL (ref 5–40)

## 2021-12-21 MED ORDER — METOPROLOL SUCCINATE ER 50 MG PO TB24
75.0000 mg | ORAL_TABLET | Freq: Every day | ORAL | 1 refills | Status: DC
  Filled 2021-12-21: qty 135, 90d supply, fill #0

## 2021-12-23 ENCOUNTER — Other Ambulatory Visit: Payer: Self-pay

## 2021-12-27 ENCOUNTER — Other Ambulatory Visit: Payer: Self-pay

## 2022-01-06 ENCOUNTER — Ambulatory Visit
Admission: RE | Admit: 2022-01-06 | Discharge: 2022-01-06 | Disposition: A | Discharge status: Home / Self Care | Payer: Medicare PPO | Source: Ambulatory Visit | Attending: Internal Medicine | Admitting: Internal Medicine

## 2022-01-06 DIAGNOSIS — G4489 Other headache syndrome: Secondary | ICD-10-CM

## 2022-01-06 DIAGNOSIS — G319 Degenerative disease of nervous system, unspecified: Secondary | ICD-10-CM | POA: Diagnosis not present

## 2022-01-06 DIAGNOSIS — I6381 Other cerebral infarction due to occlusion or stenosis of small artery: Secondary | ICD-10-CM | POA: Diagnosis not present

## 2022-01-06 DIAGNOSIS — I6782 Cerebral ischemia: Secondary | ICD-10-CM | POA: Diagnosis not present

## 2022-01-06 MED ORDER — GADOBENATE DIMEGLUMINE 529 MG/ML IV SOLN
20.0000 mL | Freq: Once | INTRAVENOUS | Status: AC | PRN
  Administered 2022-01-06: 20 mL via INTRAVENOUS

## 2022-01-16 ENCOUNTER — Ambulatory Visit: Payer: Medicare PPO | Attending: Internal Medicine | Admitting: Internal Medicine

## 2022-01-16 ENCOUNTER — Ambulatory Visit
Admission: RE | Admit: 2022-01-16 | Discharge: 2022-01-16 | Disposition: A | Discharge status: Home / Self Care | Payer: Medicare PPO | Source: Ambulatory Visit | Attending: Internal Medicine | Admitting: Internal Medicine

## 2022-01-16 ENCOUNTER — Encounter: Payer: Self-pay | Admitting: Internal Medicine

## 2022-01-16 VITALS — BP 167/98 | HR 66 | Temp 98.0°F | Ht 68.0 in | Wt 296.4 lb

## 2022-01-16 DIAGNOSIS — I2510 Atherosclerotic heart disease of native coronary artery without angina pectoris: Secondary | ICD-10-CM

## 2022-01-16 DIAGNOSIS — M542 Cervicalgia: Secondary | ICD-10-CM

## 2022-01-16 DIAGNOSIS — G4489 Other headache syndrome: Secondary | ICD-10-CM | POA: Diagnosis not present

## 2022-01-16 DIAGNOSIS — I10 Essential (primary) hypertension: Secondary | ICD-10-CM | POA: Diagnosis not present

## 2022-01-16 MED ORDER — METOPROLOL SUCCINATE ER 50 MG PO TB24
50.0000 mg | ORAL_TABLET | Freq: Every day | ORAL | 1 refills | Status: DC
  Filled 2022-01-16 – 2022-02-22 (×2): qty 90, 90d supply, fill #0
  Filled 2022-05-23: qty 90, 90d supply, fill #1

## 2022-01-16 MED ORDER — ENTRESTO 97-103 MG PO TABS: 1.0000 | ORAL_TABLET | Freq: Two times a day (BID) | ORAL | 6 refills | Status: DC

## 2022-01-16 NOTE — Progress Notes (Signed)
Patient ID: Tracey Gallegos, female    DOB: [birth year]  MRN: [id number]  CC: Neck Pain   Subjective: Tracey Gallegos is a 66 y.o. female who presents for f/u neck pain and headache Her concerns today include:  Patient with history of HTN, DM type II, CAD with DES to LT CXM, Diastolic and systolic CHF with EF of 40-76%, moderate pulmonary hypertension on echo 04/2018, morbid obesity.  Patient seen about 1 month ago for left-sided neck pain and new onset headaches.  Please refer to my note on 12/20/2021.   Neck pain was assessed to be MSK in nature.  I recommended plain x-ray and if negative plan was to proceed with MRI to evaluate for spinal stenosis or nerve compression.  Patient was not aware that she needed to inform the radiology tech of the x-ray when she had the MRI of the head so she did not have it done. -Prescribed Voltaren gel which she states helps.  At night she sleeps on a heating pad.  No numbness or tingling in the arms.  Pain still extends to the left shoulder.  HA decreased in frequency from daily to Q 3-4 days Used Fioricet 3 x since last visit I went over MRI results.  This showed chronic small vessel changes in the cerebral white matter, chronic lacunar infarcts within the bilateral basal ganglia and chronic parenchymal microhemorrhages suggestive of chronic hypertensive microangiopathy -On last visit I recommended increasing metoprolol to 75 mg daily due to blood pressure not being at goal.  Patient states she was still taking the 50 mg daily because she did not see 75 mg on the bottle.  No device at home to check blood pressure.   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   Dyslipidemia, goal LDL below 70 12/09/2019   Papanicolaou smear of cervix with low risk human papillomavirus (HPV) DNA test positive 01/21/2019   Influenza vaccination declined 07/20/2018   Chronic combined systolic and diastolic CHF  (congestive heart failure) ([place]) 07/20/2018   Positive depression screening 07/20/2018   CAD (coronary artery disease) 07/09/2018   Hypertensive heart disease with acute on chronic systolic congestive heart failure ([place]) 06/24/2018   Type 2 diabetes mellitus without complication, without long-term current use of insulin ([place]) 06/24/2018   Morbid obesity ([place])      Current Outpatient Medications on File Prior to Visit  Medication Sig Dispense Refill   Accu-Chek Softclix Lancets lancets Use to check blood sugar daily. E11.9 100 each 2   amLODipine (NORVASC) 10 MG tablet Take 1 tablet (10 mg total) by mouth daily. 90 tablet 2   aspirin (ASPIRIN 81) 81 MG EC tablet Take 1 tablet (81 mg total) by mouth daily. 100 tablet 2   atorvastatin (LIPITOR) 40 MG tablet TAKE 1 TABLET (40 MG TOTAL) BY MOUTH DAILY AT 6 PM. 90 tablet 3   Blood Glucose Monitoring Suppl (ACCU-CHEK GUIDE ME) w/Device KIT Use to check blood sugar daily. E11.9 1 kit 0   butalbital-acetaminophen-caffeine (FIORICET) 50-325-40 MG tablet Take 1-2 tablets by mouth 2 (two) times daily as needed for headache. 20 tablet 0   diclofenac Sodium (VOLTAREN) 1 % GEL Apply 2 g topically 4 (four) times daily. 100 g 1   furosemide (LASIX) 40 MG tablet TAKE 1 TABLET (40 MG TOTAL) BY MOUTH DAILY. 90 tablet 1   glipiZIDE (GLUCOTROL) 5 MG tablet Take 1 tablet by mouth 2 (two) times  daily before a meal. 180 tablet 1   glucose blood (ACCU-CHEK GUIDE) test strip USE TO CHECK BLOOD SUGAR DAILY. E11.9 100 strip 2   Multiple Vitamins-Minerals (MULTIVITAMIN WITH MINERALS) tablet Take 1 tablet by mouth daily.     Omega-3 Fatty Acids (FISH OIL PO) Take 1,000 mg by mouth daily.      potassium chloride (KLOR-CON) 10 MEQ tablet TAKE 1 TABLET TWICE DAILY 180 tablet 1   PRENAT-FECBN-FEBISG-FA-FISHOIL PO Take by mouth. (Patient not taking: Reported on 01/16/2022)     No current facility-administered medications on file prior to visit.    Allergies  Allergen  Reactions   Lisinopril Cough    Social History   Socioeconomic History   Marital status: Single    Spouse name: Not on file   Number of children: 0   Years of education: Not on file   Highest education level: Not on file  Occupational History   Not on file  Tobacco Use   Smoking status: Never   Smokeless tobacco: Never  Vaping Use   Vaping Use: Never used  Substance and Sexual Activity   Alcohol use: Yes    Comment: occasionally   Drug use: Never   Sexual activity: Not Currently  Other Topics Concern   Not on file  Social History Narrative   Not on file   Social Determinants of Health   Financial Resource Strain: Low Risk  (03/23/2021)   Overall Financial Resource Strain (CARDIA)    Difficulty of Paying Living Expenses: Not hard at all  Food Insecurity: No Food Insecurity (03/23/2021)   Hunger Vital Sign    Worried About Running Out of Food in the Last Year: Never true    [place] in the Last Year: Never true  Transportation Needs: No Transportation Needs (03/23/2021)   PRAPARE - Hydrologist (Medical): No    Lack of Transportation (Non-Medical): No  Physical Activity: Sufficiently Active (03/23/2021)   Exercise Vital Sign    Days of Exercise per Week: 5 days    Minutes of Exercise per Session: 80 min  Stress: No Stress Concern Present (03/23/2021)   [place]    Feeling of Stress : Not at all  Social Connections: Moderately Isolated (03/23/2021)   Social Connection and Isolation Panel [NHANES]    Frequency of Communication with Friends and Family: Three times a week    Frequency of Social Gatherings with Friends and Family: Never    Attends Religious Services: Never    Marine scientist or Organizations: Yes    Attends Archivist Meetings: Never    Marital Status: Never married  Intimate Partner Violence: Not At Risk (03/23/2021)   Humiliation, Afraid,  Rape, and Kick questionnaire    Fear of Current or Ex-Partner: No    Emotionally Abused: No    Physically Abused: No    Sexually Abused: No    Family History  Problem Relation Age of Onset   Diabetes Mother    Colon cancer Brother     Past Surgical History:  Procedure Laterality Date   CARDIAC CATHETERIZATION  06/28/2018   CORONARY STENT INTERVENTION N/A 06/28/2018   Procedure: CORONARY STENT INTERVENTION;  Surgeon: Leonie Man, MD;  Location: [facility];  Service: Cardiovascular;  Laterality: N/A;   CORONARY STENT PLACEMENT  06/28/2018   RIGHT/LEFT HEART CATH AND CORONARY ANGIOGRAPHY N/A 06/28/2018   Procedure: RIGHT/LEFT HEART CATH  AND CORONARY ANGIOGRAPHY;  Surgeon: Leonie Man, MD;  Location: [facility];  Service: Cardiovascular;  Laterality: N/A;   TOTAL KNEE ARTHROPLASTY Left 05/2016    ROS: Review of Systems Negative except as stated above  PHYSICAL EXAM: BP (!) 167/98   Pulse 66   Temp 98 F (36.7 C) (Oral)   Ht $R'5\' 8"'ru$  (1.727 m)   Wt 296 lb 6.4 oz (134.4 kg)   SpO2 98%   BMI 45.07 kg/m   Wt Readings from Last 3 Encounters:  01/16/22 296 lb 6.4 oz (134.4 kg)  12/20/21 293 lb (132.9 kg)  09/05/21 299 lb (135.6 kg)  BP 160/90  Physical Exam  General appearance - alert, well appearing, and in no distress Mental status - normal mood, behavior, speech, dress, motor activity, and thought processes Chest - clear to auscultation, no wheezes, rales or rhonchi, symmetric air entry Heart - normal rate, regular rhythm, normal S1, S2, no murmurs, rubs, clicks or gallops Neurological -cranial nerves are grossly intact.  Gross sensation intact.  Power in the upper extremities 5/5 bilaterally. MSK: Still some mild tenderness on palpation of the lower cervical spine.      Latest Ref Rng & Units 12/20/2021    4:43 PM 01/17/2021    4:51 PM 01/31/2020    4:20 PM  CMP  Glucose 70 - 99 mg/dL 135  128  88   BUN 8 - 27 mg/dL $Remove'16  16  16   'vOfrFsP$ Creatinine 0.57  - 1.00 mg/dL 0.97  1.00  1.11   Sodium 134 - 144 mmol/L 144  143  142   Potassium 3.5 - 5.2 mmol/L 4.9  5.2  4.4   Chloride 96 - 106 mmol/L 103  103  103   CO2 20 - 29 mmol/L $RemoveB'24  27  27   'avnjACAS$ Calcium 8.7 - 10.3 mg/dL 9.9  10.3  9.8   Total Protein 6.0 - 8.5 g/dL 7.6  7.3  7.4   Total Bilirubin 0.0 - 1.2 mg/dL 0.5  0.7  0.7   Alkaline Phos 44 - 121 IU/L 115  99  122   AST 0 - 40 IU/L $Remov'26  24  20   'lbFXOz$ ALT 0 - 32 IU/L $Remov'18  17  14    'cbOjRe$ Lipid Panel     Component Value Date/Time   CHOL 194 12/20/2021 1643   TRIG 98 12/20/2021 1643   HDL 55 12/20/2021 1643   CHOLHDL 3.5 12/20/2021 1643   CHOLHDL 5.5 04/27/2018 1513   VLDL 17 04/27/2018 1513   LDLCALC 121 (H) 12/20/2021 1643    CBC    Component Value Date/Time   WBC 5.4 12/20/2021 1643   WBC 5.4 06/30/2018 0401   RBC 5.25 12/20/2021 1643   RBC 5.42 (H) 06/30/2018 0401   HGB 14.8 12/20/2021 1643   HCT 44.5 12/20/2021 1643   PLT 238 12/20/2021 1643   MCV 85 12/20/2021 1643   MCH 28.2 12/20/2021 1643   MCH 24.7 (L) 06/30/2018 0401   MCHC 33.3 12/20/2021 1643   MCHC 30.3 06/30/2018 0401   RDW 14.0 12/20/2021 1643   LYMPHSABS 1.7 07/09/2018 0937   MONOABS 0.5 06/24/2018 1305   EOSABS 0.6 (H) 07/09/2018 0937   BASOSABS 0.1 07/09/2018 0937    ASSESSMENT AND PLAN: 1. Acute neck pain Advised patient to go to the radiology [facility] imaging downstairs and have the x-rays done today of the cervical spine.  Further management will be based on results.  She will continue  the Voltaren gel and heating pad for now. - DG Cervical Spine Complete; Future  2. Headache syndrome MRI of the head showing old lacunar strokes and changes consistent with hypertensive microangiopathy. We need to get her blood pressure better controlled to make sure this is not playing a role. Continue Fioricet as needed. Given MRI findings, discussed the importance of good blood pressure, diabetes and cholesterol control. Keep upcoming appointment with neurology early next  month.  3. Essential hypertension Not at goal. I recommend increasing Entresto to 97/103 mg twice daily.  After being on this increased dose for 1 week, I request that she returns to the lab to have chemistry checked.  Recommend that she stay on metoprolol 50 mg once a day since she never made the increase as I had suggested on last visit. Follow-up with clinical pharmacist in 2 weeks for repeat blood pressure check. - sacubitril-valsartan (ENTRESTO) 97-103 MG; Take 1 tablet by mouth 2 (two) times daily.  Dispense: 60 tablet; Refill: 6 - metoprolol succinate (TOPROL-XL) 50 MG 24 hr tablet; Take 1 tablet (50 mg total) by mouth daily.  Dispense: 90 tablet; Refill: 1   Patient was given the opportunity to ask questions.  Patient verbalized understanding of the plan and was able to repeat key elements of the plan.   This documentation was completed using Radio producer.  Any transcriptional errors are unintentional.  Orders Placed This Encounter  Procedures   DG Cervical Spine Complete     Requested Prescriptions   Signed Prescriptions Disp Refills   sacubitril-valsartan (ENTRESTO) 97-103 MG 60 tablet 6    Sig: Take 1 tablet by mouth 2 (two) times daily.   metoprolol succinate (TOPROL-XL) 50 MG 24 hr tablet 90 tablet 1    Sig: Take 1 tablet (50 mg total) by mouth daily.    Return in about 2 months (around 03/18/2022) for Appt with [facility] in 2 wks for BP check.  Karle Plumber, MD, FACP

## 2022-01-16 NOTE — Patient Instructions (Addendum)
We have increased the dose of the Entresto to 97/103 mg twice a day. After you have been on the increased dose for 1 week, please return to the lab to have blood test done to recheck your potassium and kidney function. We will have you follow-up with our clinical pharmacist in a few weeks for recheck of your blood pressure.  Goal is to get your blood pressure 130/80 or lower.  Please stop downstairs at [facility] imaging on the first floor to get the x-ray done of your neck. Please keep the upcoming appointment next week with the neurologist.  I have referred you to them.

## 2022-01-17 ENCOUNTER — Other Ambulatory Visit: Payer: Self-pay

## 2022-01-20 ENCOUNTER — Telehealth: Payer: Self-pay | Admitting: Internal Medicine

## 2022-01-20 DIAGNOSIS — M5412 Radiculopathy, cervical region: Secondary | ICD-10-CM

## 2022-01-20 DIAGNOSIS — M5030 Other cervical disc degeneration, unspecified cervical region: Secondary | ICD-10-CM

## 2022-01-20 NOTE — Telephone Encounter (Signed)
PC placed to pt today. Pt infoirmed that the x-ray of her neck showed significant degen disc ds and arthritis changes.  I explained to her in layman's term what this means.  We discuss referral for P.T vs referral to spine specialist.  I recommended the referral to the spine specialist. Pt is in agreement

## 2022-01-23 ENCOUNTER — Encounter: Payer: Self-pay | Admitting: Psychiatry

## 2022-01-23 ENCOUNTER — Ambulatory Visit: Payer: Medicare PPO | Admitting: Psychiatry

## 2022-01-23 VITALS — BP 140/78 | HR 67 | Ht 68.0 in

## 2022-01-23 DIAGNOSIS — M542 Cervicalgia: Secondary | ICD-10-CM

## 2022-01-23 DIAGNOSIS — I639 Cerebral infarction, unspecified: Secondary | ICD-10-CM

## 2022-01-23 NOTE — Progress Notes (Signed)
Referring:  Ladell Pier, MD [address] [place],  [place] [address]  PCP: Ladell Pier, MD  Neurology was asked to evaluate Tracey Gallegos, a 66 year old female for a chief complaint of headaches.  Our recommendations of care will be communicated by shared medical record.    CC:  headaches  History provided from self  HPI:  Medical co-morbidities: CAD, CHF, HTN, DM2  The patient presents for evaluation of headaches and neck pain which began 2 months ago. No clear inciting event for her pain. It has not gotten better or worse over time. Headaches are described as aching pain which starts in her neck and radiates up the back of her head. It does not have associated photophobia, phonophobia, or nausea. She is usually pain free when she wakes up and then develops a headache as the day goes on. She is not sure how long they last but states they typically will not last the whole day. She takes Fioricet as needed which does help somewhat.   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 She is taking ASA 81 mg daily and lipitor 40 mg daily. Recent LDL was 121 and A1c was 7.5. Follows with cardiology for CHF, TTE 04/2019 showed an EF of 55% and was negative for shunt.  C-spine X-ray 01/16/22 showed advanced degenerative disc disease throughout the spine. She has an appointment with orthopedics scheduled for next month. She denies radicular pain, paresthesias/numbness of the upper extremities, or weakness of the upper extremities.  Headache History: Onset: 2 months ago Triggers: none, gets worse as the day goes on Aura: none Location: neck, occiput radiating forward Quality/Description: aching Associated Symptoms:  Photophobia: no  Phonophobia: no  Nausea: no Other symptoms: tinnitus Worse with activity?: no Duration of  headaches: varies  Headache days per month: 12 Headache free days per month: 18  Current Treatment: Abortive Fioricet  Preventative none  Prior Therapies                                 Fioricet Metoprolol valsartan  LABS: CBC    Component Value Date/Time   WBC 5.4 12/20/2021 1643   WBC 5.4 06/30/2018 0401   RBC 5.25 12/20/2021 1643   RBC 5.42 (H) 06/30/2018 0401   HGB 14.8 12/20/2021 1643   HCT 44.5 12/20/2021 1643   PLT 238 12/20/2021 1643   MCV 85 12/20/2021 1643   MCH 28.2 12/20/2021 1643   MCH 24.7 (L) 06/30/2018 0401   MCHC 33.3 12/20/2021 1643   MCHC 30.3 06/30/2018 0401   RDW 14.0 12/20/2021 1643   LYMPHSABS 1.7 07/09/2018 0937   MONOABS 0.5 06/24/2018 1305   EOSABS 0.6 (H) 07/09/2018 0937   BASOSABS 0.1 07/09/2018 0937      Latest Ref Rng & Units 12/20/2021    4:43 PM 01/17/2021    4:51 PM 01/31/2020    4:20 PM  CMP  Glucose 70 - 99 mg/dL 135  128  88   BUN 8 - 27 mg/dL $Remove'16  16  16   'HGxXbKi$ Creatinine 0.57 - 1.00 mg/dL 0.97  1.00  1.11   Sodium 134 - 144 mmol/L 144  143  142   Potassium 3.5 - 5.2 mmol/L 4.9  5.2  4.4   Chloride 96 - 106 mmol/L 103  103  103   CO2 20 - 29 mmol/L $RemoveB'24  27  27   'hLpZVjWW$ Calcium 8.7 - 10.3 mg/dL 9.9  10.3  9.8   Total Protein 6.0 - 8.5 g/dL 7.6  7.3  7.4   Total Bilirubin 0.0 - 1.2 mg/dL 0.5  0.7  0.7   Alkaline Phos 44 - 121 IU/L 115  99  122   AST 0 - 40 IU/L $Remov'26  24  20   'SzCetH$ ALT 0 - 32 IU/L $Remov'18  17  14    'nrmCbx$ LDL 121, A1c 7.5  IMAGING:  MRI brain 01/06/22: 1. No evidence of acute intracranial abnormality. 2. Moderate chronic small vessel ischemic changes within the cerebral white matter. 3. Chronic lacunar infarcts within the bilateral basal ganglia. 4. Few supratentorial and infratentorial chronic parenchymal microhemorrhages in a distribution suggesting sequela of chronic hypertensive microangiopathy. 5. Mild generalized cerebral atrophy. 6. Mucosal thickening within the bilateral maxillary sinuses (moderate right, mild  left).  C-spine X-ray 01/16/22: Advanced degenerative disc disease throughout the cervical spine as described above.  Imaging independently reviewed on January 23, 2022   Current Outpatient Medications on File Prior to Visit  Medication Sig Dispense Refill   Accu-Chek Softclix Lancets lancets Use to check blood sugar daily. E11.9 100 each 2   amLODipine (NORVASC) 10 MG tablet Take 1 tablet (10 mg total) by mouth daily. 90 tablet 2   aspirin (ASPIRIN 81) 81 MG EC tablet Take 1 tablet (81 mg total) by mouth daily. 100 tablet 2   atorvastatin (LIPITOR) 40 MG tablet TAKE 1 TABLET (40 MG TOTAL) BY MOUTH DAILY AT 6 PM. 90 tablet 3   Blood Glucose Monitoring Suppl (ACCU-CHEK GUIDE ME) w/Device KIT Use to check blood sugar daily. E11.9 1 kit 0   butalbital-acetaminophen-caffeine (FIORICET) 50-325-40 MG tablet Take 1-2 tablets by mouth 2 (two) times daily as needed for headache. 20 tablet 0   diclofenac Sodium (VOLTAREN) 1 % GEL Apply 2 g topically 4 (four) times daily. 100 g 1   furosemide (LASIX) 40 MG tablet TAKE 1 TABLET (40 MG TOTAL) BY MOUTH DAILY. 90 tablet 1   glipiZIDE (GLUCOTROL) 5 MG tablet Take 1 tablet by mouth 2 (two) times daily before a meal. 180 tablet 1   glucose blood (ACCU-CHEK GUIDE) test strip USE TO CHECK BLOOD SUGAR DAILY. E11.9 100 strip 2   metoprolol succinate (TOPROL-XL) 50 MG 24 hr tablet Take 1 tablet (50 mg total) by mouth daily. 90 tablet 1   Multiple Vitamins-Minerals (MULTIVITAMIN WITH MINERALS) tablet Take 1 tablet by mouth daily.     Omega-3 Fatty Acids (FISH OIL PO) Take 1,000 mg by mouth daily.      potassium chloride (KLOR-CON) 10 MEQ tablet TAKE 1 TABLET TWICE DAILY 180 tablet 1   PRENAT-FECBN-FEBISG-FA-FISHOIL PO Take by mouth. (Patient not taking: Reported on 01/16/2022)     sacubitril-valsartan (ENTRESTO) 97-103 MG Take 1 tablet by mouth 2 (two) times daily. 60 tablet 6   No current facility-administered medications on file prior to visit.      Allergies: Allergies  Allergen Reactions   Lisinopril Cough    Family History: Migraine or other headaches in the family:  no Aneurysms in a first degree relative:  no Brain tumors in the family:  no Other neurological illness in the family:   no  Past Medical History: Past Medical History:  Diagnosis Date   CHF (congestive heart failure) (HCC)    Coronary artery disease    Diabetes mellitus without complication ([place])  Dyspnea    Hypertension     Past Surgical History Past Surgical History:  Procedure Laterality Date   CARDIAC CATHETERIZATION  06/28/2018   CORONARY STENT INTERVENTION N/A 06/28/2018   Procedure: CORONARY STENT INTERVENTION;  Surgeon: Leonie Man, MD;  Location: [place] [facility];  Service: Cardiovascular;  Laterality: N/A;   CORONARY STENT PLACEMENT  06/28/2018   RIGHT/LEFT HEART CATH AND CORONARY ANGIOGRAPHY N/A 06/28/2018   Procedure: RIGHT/LEFT HEART CATH AND CORONARY ANGIOGRAPHY;  Surgeon: Leonie Man, MD;  Location: [place] [facility];  Service: Cardiovascular;  Laterality: N/A;   TOTAL KNEE ARTHROPLASTY Left 05/2016    Social History: Social History   Tobacco Use   Smoking status: Never   Smokeless tobacco: Never  Vaping Use   Vaping Use: Never used  Substance Use Topics   Alcohol use: Yes    Comment: occasionally   Drug use: Never    ROS: Negative for fevers, chills. Positive for headaches, neck pain. All other systems reviewed and negative unless stated otherwise in HPI.   Physical Exam:   Vital Signs: BP 140/78   Pulse 67   Ht $R'5\' 8"'fI$  (1.727 m)   BMI 45.07 kg/m  GENERAL: well appearing,in no acute distress,alert SKIN:  Color, texture, turgor normal. No rashes or lesions HEAD:  Normocephalic/atraumatic. CV:  RRR RESP: Normal respiratory effort MSK: +tenderness to palpation over right neck and shoulders. Limited cervical range of motion turning head left and right  NEUROLOGICAL: Mental Status: Alert, oriented  to person, place and time,Follows commands Cranial Nerves: PERRL, visual fields intact to confrontation, extraocular movements intact, facial sensation intact, no facial droop or ptosis, hearing grossly intact, no dysarthria Motor: muscle strength 5/5 both upper and lower extremities,no drift, normal tone Reflexes: 1+ bilateral biceps and brachioradialis, 0 patellars (hx knee replacement on left) Sensation: intact to light touch all 4 extremities Coordination: Finger-to- nose-finger intact bilaterally Gait: normal-based   IMPRESSION: 66 year old female with a history of CAD, CHF, HTN, DM2 who presents for evaluation of headaches and neck pain for the past 2 months. MRI brain with chronic basal ganglia infarcts but no acute process. Will order CTA head/neck to assess for atherosclerosis of intra and extracranial vessels. C-spine X-ray with advanced degenerative changes. Headaches are most consistent with tension type headaches and exam does reveal muscle spasms with decreased cervical range of motion. Will refer to neck PT. She would prefer to avoid medications for now and try physical therapy alone as a first step.  PLAN: -CTA head/neck -Referral to neck PT  I spent a total of 35 minutes chart reviewing and counseling the patient. Headache education was done. Discussed treatment options including preventive and acute medications, and physical therapy. Written educational materials and patient instructions outlining all of the above were given.  Follow-up: 6 months   Genia Harold, MD 01/23/2022   10:54 AM

## 2022-01-23 NOTE — Patient Instructions (Signed)
Referral to physical therapy for the neck CT scan of the blood vessels in the head and neck

## 2022-01-27 ENCOUNTER — Telehealth: Payer: Self-pay | Admitting: Psychiatry

## 2022-01-27 NOTE — Telephone Encounter (Signed)
[employer] Josem Kaufmann: [id number] exp. 01/27/22-02/26/22 sent to GI

## 2022-01-29 ENCOUNTER — Telehealth: Payer: Self-pay | Admitting: Psychiatry

## 2022-01-29 NOTE — Telephone Encounter (Signed)
Referral for Physical Therapy sent to [facility] [phone]. 

## 2022-02-04 ENCOUNTER — Ambulatory Visit: Payer: Self-pay

## 2022-02-04 ENCOUNTER — Ambulatory Visit: Payer: Medicare PPO | Attending: Psychiatry | Admitting: Physical Therapy

## 2022-02-04 DIAGNOSIS — M542 Cervicalgia: Secondary | ICD-10-CM | POA: Diagnosis not present

## 2022-02-04 NOTE — Therapy (Signed)
OUTPATIENT PHYSICAL THERAPY CERVICAL EVALUATION   Patient Name: Tracey Gallegos MRN: [id number] DOB:11/17/1955, 66 y.o., female Today's Date: 02/04/2022   PT End of Session - 02/04/22 1321     Visit Number 1    Number of Visits 5    Date for PT Re-Evaluation 03/11/22    Authorization Type [employer]    PT Start Time 1318    PT Stop Time 1353    PT Time Calculation (min) 35 min    Activity Tolerance Patient tolerated treatment well    Behavior During Therapy WFL for tasks assessed/performed             Past Medical History:  Diagnosis Date   CHF (congestive heart failure) ([place])    Coronary artery disease    Diabetes mellitus without complication ([place])    Dyspnea    Hypertension    Past Surgical History:  Procedure Laterality Date   CARDIAC CATHETERIZATION  06/28/2018   CORONARY STENT INTERVENTION N/A 06/28/2018   Procedure: CORONARY STENT INTERVENTION;  Surgeon: Leonie Man, MD;  Location: [facility];  Service: Cardiovascular;  Laterality: N/A;   CORONARY STENT PLACEMENT  06/28/2018   RIGHT/LEFT HEART CATH AND CORONARY ANGIOGRAPHY N/A 06/28/2018   Procedure: RIGHT/LEFT HEART CATH AND CORONARY ANGIOGRAPHY;  Surgeon: Leonie Man, MD;  Location: [facility];  Service: Cardiovascular;  Laterality: N/A;   TOTAL KNEE ARTHROPLASTY Left 05/2016   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   Dyslipidemia, goal LDL below 70 12/09/2019   Papanicolaou smear of cervix with low risk human papillomavirus (HPV) DNA test positive 01/21/2019   Influenza vaccination declined 07/20/2018   Chronic combined systolic and diastolic CHF (congestive heart failure) ([place]) 07/20/2018   Positive depression screening 07/20/2018   CAD (coronary artery disease) 07/09/2018   Hypertensive heart disease with acute on chronic systolic congestive heart failure ([place]) 06/24/2018   Type 2 diabetes mellitus  without complication, without long-term current use of insulin ([place]) 06/24/2018   Morbid obesity ([place])     PCP: Ladell Pier, MD  REFERRING PROVIDER: Genia Harold, MD   REFERRING DIAG: M54.2 (ICD-10-CM) - Cervicalgia   THERAPY DIAG:  Cervicalgia  Rationale for Evaluation and Treatment Rehabilitation  ONSET DATE: 01/23/2022 (referral)   SUBJECTIVE:  SUBJECTIVE STATEMENT: "Ive been having pain/strain on L side of neck and back of shoulders/arms is sensitive". Pt reports her pain started over 2 months ago, of gradual onset. Pt was sleeping on L side but her L arm would get tingly/stiff so she has switched to sleeping on her back. Pt also reports onset of headaches. Pt reports she has been taking medication for headaches/pain with some relief and has also been using an ointment and a heating pad, unable to tell if this has been helpful.   PERTINENT HISTORY: CAD, CHF, HTN, DM2    PAIN:  Are you having pain? No and gets up to 5/10 at most.  PRECAUTIONS: None  WEIGHT BEARING RESTRICTIONS No  FALLS:  Has patient fallen in last 6 months? Yes. Number of falls one time in the last 6 months tripped in the yard . Denies hitting her head or LOC.  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   PATIENT SURVEYS:  NDI to be performed next  session   COGNITION: Overall cognitive status: Within functional limits for tasks assessed   SENSATION: Pt reports peripheralization into LUE with exacerbation of pain and when sleeping on L side.  POSTURE: rounded shoulders and forward head  PALPATION: Bilateral upper traps in seated position, no trigger points noted and no patient-reported sensitivity but significant tightness detected. Bilateral upper traps in supine - significant tightness again palpated with no patient reported sensitivity. SCM significant tightness in L as compared to R, no sensitivity reported from patient in neutral but reports some reproduction of symptoms with R lateral flexion.  CERVICAL ROM:   Active ROM AROM (deg) eval  Flexion 8  Extension 45  Right lateral flexion 20  Left lateral flexion 22  Right rotation 45  Left rotation 38   (Blank rows = not tested)  UPPER EXTREMITY ROM: tested in seated position   Active ROM Right eval Left eval  Shoulder flexion Se [facility]  Shoulder extension    Shoulder abduction    Shoulder adduction    Shoulder extension    Shoulder internal rotation    Shoulder external rotation    Elbow flexion    Elbow extension    Wrist flexion    Wrist extension    Wrist ulnar deviation    Wrist radial deviation    Wrist pronation    Wrist supination     (Blank rows = not tested)  UPPER EXTREMITY MMT: tested in seated position   MMT Right eval Left eval  Shoulder flexion 5 4+  Shoulder extension    Shoulder abduction 4+ 4+  Shoulder adduction    Shoulder extension    Shoulder internal rotation 5 5  Shoulder external rotation 5 4+  Middle trapezius    Lower trapezius    Elbow flexion 5 5  Elbow extension 5 5  Wrist flexion    Wrist extension    Wrist ulnar deviation    Wrist radial deviation    Wrist pronation    Wrist supination    Grip strength     (Blank rows = not tested)  CERVICAL SPECIAL TESTS:  Neck flexor  muscle endurance test:  Negative   TODAY'S TREATMENT:  Next session   PATIENT EDUCATION:  Education details: Eval findings, POC Person educated: Patient Education method: Explanation Education comprehension: verbalized understanding   HOME EXERCISE PROGRAM: To be established next session  ASSESSMENT:  CLINICAL IMPRESSION: Patient is a 66 year old female referred to Neuro OPPT for cervicalgia and cervicogenic headaches.   Pt's PMH is significant for: CAD, CHF, HTN, DM2.  The following deficits were present during the exam: decreased cervical ROM with rotation and lateral flexion, pain, and postural deficits. Pt would benefit from skilled PT to address these impairments and functional limitations to maximize functional mobility independence    OBJECTIVE IMPAIRMENTS decreased ROM, impaired UE functional use, improper body mechanics, postural dysfunction, and pain.   ACTIVITY LIMITATIONS lifting, sleeping, and reach over head  PARTICIPATION LIMITATIONS: occupation  [place] and Profession are also affecting patient's functional outcome.   REHAB POTENTIAL: Good  CLINICAL DECISION MAKING: Stable/uncomplicated  EVALUATION COMPLEXITY: Low   GOALS: Goals reviewed with patient? Yes  SHORT TERM GOALS = LTG due to POC length: Target date: 03/04/2022   Pt will be independent with final HEP for improved strength, balance, transfers and gait.  Baseline: Goal status: INITIAL  2.  Pt will increase L cervical rotation to >/= 45 degrees for improved functional ROM. Baseline: 38 degrees Goal status: INITIAL  3.  Pt will increase cervical flexion--to be reassessed next session and goal updated. Baseline:  Goal status: INITIAL  4.  NDI to be assessed and goal written. Baseline:  Goal status: INITIAL    PLAN: PT FREQUENCY: 1x/week  PT DURATION: 4 weeks  PLANNED INTERVENTIONS: Therapeutic exercises, Therapeutic activity, Neuromuscular re-education, Patient/Family education, Self  Care, Joint mobilization, Dry Needling, Moist heat, and Manual therapy  PLAN FOR NEXT SESSION: Assess NDI, reassess cervical flexion, establish initial HEP for cervicalgia   Cruzita Lederer Naylah Cork, PT, DPT Excell Seltzer, PT, DPT, CSRS 02/04/2022, 1:58 PM

## 2022-02-11 ENCOUNTER — Other Ambulatory Visit: Payer: Self-pay

## 2022-02-11 ENCOUNTER — Ambulatory Visit: Payer: Medicare PPO | Attending: Internal Medicine | Admitting: Pharmacist

## 2022-02-11 DIAGNOSIS — I10 Essential (primary) hypertension: Secondary | ICD-10-CM | POA: Diagnosis not present

## 2022-02-11 MED ORDER — ENTRESTO 97-103 MG PO TABS: 1.0000 | ORAL_TABLET | Freq: Two times a day (BID) | ORAL | 6 refills | Status: DC

## 2022-02-11 NOTE — Progress Notes (Signed)
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  PMH is significant for HTN, CHF, CAD, T2DM, HLD, depression, morbid obesity. Patient was referred and last seen by Primary Care Provider, [name], on 01/16/22. At last visit, BP was 167/98 and increased Entresto dose from 49-'51mg'$  BID to 97-'103mg'$  BID. BP at neurology appt 01/23/22 was 140/78.   Today, patient arrives in spirits and presents without assistance. Denies dizziness, headache, blurred vision. Has some swelling but doesn't think it's due to amlodipine. She reports that she has not started the higher dose of Entresto that was prescribed at last visit. Patient is enrolled in assistance for [facility] currently. She did not receive the higher dose because she was unsure if it was covered.   Patient reports hypertension was diagnosed 20-25 years ago.   Family/Social history: nonsmoker; family hx of DM  Medication adherence reported good. Reports no missed doses in last week. Patient has taken BP medications today around 8am.   Current antihypertensives include: amlodipine '10mg'$  daily, furosemide '40mg'$  daily, metoprolol succinate '50mg'$  daily, Entresto 49-'51mg'$  BID   Antihypertensives tried in the past include: lisinopril (2019 - discontinued due to cough), losartan (discontinued to start Entresto in 2021)   Patient does not report any home BP readings and states she does not own a cuff.   Patient reported dietary habits: Patient reports that she tries to avoid salty foods and is very conscious of adding limited salt while cooking. She reports eating fast food/takeout very rarely. Drinks 1 cup of coffee a day but is planning to quit; states she will is not planning to buy more when it runs out.   Patient-reported exercise habits: Patient reports a daily "march" in the morning for 20 minutes. She is attempting to increase her amount of exercise.    O:  Vitals:    02/11/22 1102  BP: 125/81   Last 3 Office BP readings: BP Readings from Last 3 Encounters:  02/11/22 125/81  01/23/22 140/78  01/16/22 (!) 167/98   BMET    Component Value Date/Time   NA 144 12/20/2021 1643   K 4.9 12/20/2021 1643   CL 103 12/20/2021 1643   CO2 24 12/20/2021 1643   GLUCOSE 135 (H) 12/20/2021 1643   GLUCOSE 136 (H) 06/30/2018 0401   BUN 16 12/20/2021 1643   CREATININE 0.97 12/20/2021 1643   CALCIUM 9.9 12/20/2021 1643   GFRNONAA 52 (L) 01/31/2020 1620   GFRAA 60 01/31/2020 1620   Renal function: CrCl cannot be calculated (Patient's most recent lab result is older than the maximum 21 days allowed.).  Clinical ASCVD: Yes  The ASCVD Risk score (Arnett DK, et al., 2019) failed to calculate for the following reasons:   The patient has a prior MI or stroke diagnosis  A/P: Hypertension longstanding, controlled on current medications. BP goal < 130/80 mmHg. Medication adherence appears good.  -Advised patient to start Entresto to 97-'103mg'$  BID when she receives it. Spoke with patient advocate in our pharmacy. Patient is approved for [facility] for the rest of the year. Will resend prescription to the appropriate pharmacy coordinating her assistance. Patient verbalizes understanding and states she will start the higher dose once it comes in the mail.  -Continued amlodipine '10mg'$  daily, furosemide '40mg'$  daily, metoprolol succinate '50mg'$  daily. -Patient educated on purpose, proper use, and potential adverse effects of Entresto.  -Plan to order CMP and lipid panel at next visit in 1  month -Counseled on lifestyle modifications for blood pressure control including reduced dietary sodium, increased exercise, adequate sleep. -Encouraged patient to check BP at home if possible and bring log of readings to next visit.   Results reviewed and written information provided.    Written patient instructions provided. Patient verbalized understanding of treatment plan.  Total time in face  to face counseling 30 minutes.    Follow-up:  Pharmacist 03/20/22. PCP clinic visit in 04/25/22.   Patient seen with  Inis Sizer, PharmD Candidate [employer] 2024  Benard Halsted, PharmD, [place], [place] [phone]

## 2022-02-13 ENCOUNTER — Encounter: Payer: Self-pay | Admitting: Physical Therapy

## 2022-02-13 ENCOUNTER — Ambulatory Visit: Payer: Medicare PPO | Admitting: Physical Therapy

## 2022-02-13 DIAGNOSIS — M542 Cervicalgia: Secondary | ICD-10-CM | POA: Diagnosis not present

## 2022-02-13 NOTE — Therapy (Addendum)
OUTPATIENT PHYSICAL THERAPY CERVICAL TREATMENT   Patient Name: Tracey Gallegos MRN: [id number] DOB:11-01-1955, 66 y.o., female Today's Date: 02/13/2022   PT End of Session - 02/13/22 0925     Visit Number 2    Number of Visits 5    Date for PT Re-Evaluation 03/11/22    Authorization Type [employer] Medicare    PT Start Time 0920    PT Stop Time 0958    PT Time Calculation (min) 38 min    Activity Tolerance Patient tolerated treatment well    Behavior During Therapy [facility] for tasks assessed/performed              Past Medical History:  Diagnosis Date   CHF (congestive heart failure) ([place])    Coronary artery disease    Diabetes mellitus without complication ([place])    Dyspnea    Hypertension    Past Surgical History:  Procedure Laterality Date   CARDIAC CATHETERIZATION  06/28/2018   CORONARY STENT INTERVENTION N/A 06/28/2018   Procedure: CORONARY STENT INTERVENTION;  Surgeon: Leonie Man, MD;  Location: [place] [facility];  Service: Cardiovascular;  Laterality: N/A;   CORONARY STENT PLACEMENT  06/28/2018   RIGHT/LEFT HEART CATH AND CORONARY ANGIOGRAPHY N/A 06/28/2018   Procedure: RIGHT/LEFT HEART CATH AND CORONARY ANGIOGRAPHY;  Surgeon: Leonie Man, MD;  Location: [place] [facility];  Service: Cardiovascular;  Laterality: N/A;   TOTAL KNEE ARTHROPLASTY Left 05/2016   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   Dyslipidemia, goal LDL below 70 12/09/2019   Papanicolaou smear of cervix with low risk human papillomavirus (HPV) DNA test positive 01/21/2019   Influenza vaccination declined 07/20/2018   Chronic combined systolic and diastolic CHF (congestive heart failure) ([place]) 07/20/2018   Positive depression screening 07/20/2018   CAD (coronary artery disease) 07/09/2018   Hypertensive heart disease with acute on chronic systolic congestive heart failure ([place]) 06/24/2018   Type 2 diabetes mellitus  without complication, without long-term current use of insulin ([place]) 06/24/2018   Morbid obesity ([place])     PCP: Ladell Pier, MD  REFERRING PROVIDER: Genia Harold, MD   REFERRING DIAG: M54.2 (ICD-10-CM) - Cervicalgia   THERAPY DIAG:  Cervicalgia  Rationale for Evaluation and Treatment Rehabilitation  ONSET DATE: 01/23/2022 (referral)   SUBJECTIVE:  SUBJECTIVE STATEMENT: Pt reports her headaches have been better and she has been having less of them, did get a slight one last night. Otherwise doing ok. 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  PERTINENT HISTORY: CAD, CHF, HTN, DM2    PAIN:  Are you having pain? Yes: NPRS scale: 1/10 Pain location: L side of neck Pain description: soreness Aggravating factors: sitting at a computer for extended periods of time Relieving factors: maybe the cream or use of heat has been helpful   PATIENT GOALS To reduce pain and improve functional mobility  OBJECTIVE:   DIAGNOSTIC FINDINGS:  C-spine X-ray 01/16/22 showed advanced degenerative disc disease throughout the spine. 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    SENSATION: Pt reports peripheralization into LUE with exacerbation of pain and when sleeping on L side.  POSTURE: rounded shoulders and forward head  PALPATION: Bilateral upper traps in seated position, no trigger points noted and no patient-reported sensitivity but significant tightness detected. Bilateral upper traps in supine - significant tightness again palpated with no patient  reported sensitivity. SCM significant tightness in L as compared to R, no sensitivity reported from patient in neutral but reports some reproduction of symptoms with R lateral flexion.  CERVICAL ROM:   Active ROM AROM (deg) eval AROM (deg)  02/13/22  Flexion ** 40  Extension 45   Right lateral flexion 20   Left lateral flexion 22   Right rotation 45   Left rotation 38    (Blank rows = not tested)   TODAY'S TREATMENT:  NDI: 6/50, 12% disabled, mild disability  THER EX: Initiated HEP, see below  SciFit  level 5 for 6 minutes using BUE/BLEs for reciprocal movement, dynamic cardiovascular warmup and increased ROM/stretching of BUE. No increase in pain following activity.   THER ACT: Discussed sitting posture while at a computer desk as pt does spend 2-3 hours per day in this position and reports her pain increases in this position. Educated pt on ergonomics including 90 degree elbow and knee flexion in seated position at desk with feet supported. Encouraged pt to bring in photo of her desk setup and/or photo of herself sitting at desk so that ergonomic recommendations can be made to decrease neck and shoulder pain aggravated by prolonged sitting.   PATIENT EDUCATION:  Education details: initiated HEP, discussed ergonomic setup of desk to decrease neck and shoulder pain, NDI Person educated: Patient Education method: Explanation, Demonstration, Tactile cues, and Handouts Education comprehension: verbalized understanding   HOME EXERCISE PROGRAM: Access Code: [email or web address] URL: [email or web address] Date: 02/13/2022 Prepared by: Excell Seltzer  Exercises - Seated Scapular Retraction  - 1 x daily - 7 x weekly - 3 sets - 10 reps - Seated Shoulder Flexion  - 1 x daily - 7 x weekly - 3 sets - 10 reps - Seated Shoulder Abduction - Thumbs Up  - 1 x daily - 7 x weekly - 3 sets - 10 reps - Seated Shoulder Scaption AROM  - 1 x daily - 7 x weekly - 3 sets - 10 reps - Seated  Gentle Upper Trapezius Stretch  - 1 x daily - 7 x weekly - 1 sets - 3-5 reps - 30-60 hold  ASSESSMENT:  CLINICAL IMPRESSION: Emphasis of skilled PT session on initiating  HEP with patient, administering NDI, reassessing cervical flexion due to error in measurement at eval, and 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 Pt scores 6/50 on NDI indicating 12% disability (mild disability). Provided HEP to work on scapular stability, strengthening, and stretching. Pt declines to attempt prone position this date so worked on exercises in seated position. Pt does report some increase in L lateral neck pain with I's, Y's, and T's exercises. Encouraged pt to monitor pain symptoms and discontinue exercises if they are aggravating symptoms. Discussed ergonomic positions when sitting at desk and will continue to further assess as needed. Updated pt goals to reflect NDI score and cervical flexion ROM. Continue POC.   OBJECTIVE IMPAIRMENTS decreased ROM, impaired UE functional use, improper body mechanics, postural dysfunction, and pain.   ACTIVITY LIMITATIONS lifting, sleeping, and reach over head  PARTICIPATION LIMITATIONS: occupation  [place] and Profession are also affecting patient's functional outcome.   REHAB POTENTIAL: Good  CLINICAL DECISION MAKING: Stable/uncomplicated  EVALUATION COMPLEXITY: Low   GOALS: Goals reviewed with patient? Yes  SHORT TERM GOALS = LTG due to POC length: Target date: 03/04/2022   Pt will be independent with final HEP for improved strength, balance, transfers and gait. Baseline: Goal status: INITIAL  2.  Pt will increase L cervical rotation to >/= 45 degrees for improved functional ROM. Baseline: 38 degrees Goal status: INITIAL  3.  Pt will increase cervical flexion to >/= 50 degrees for improved functional ROM. Baseline: 40 degrees (02/13/22) Goal status: INITIAL  4.  Pt to demonstrate decreased disability by improving NDI score  to 1/50 or 2% disabled (no disability) Baseline: 6/50, 12% mild disability (02/13/22) Goal status: INITIAL    PLAN: PT FREQUENCY: 1x/week  PT DURATION: 4 weeks  PLANNED INTERVENTIONS: Therapeutic exercises, Therapeutic activity, Neuromuscular re-education, Patient/Family education, Self Care, Joint mobilization, Dry Needling, Moist heat, and Manual therapy  PLAN FOR NEXT SESSION: dry needling?, shoulder rolls, I'sY'sT's in prone if pt can tolerate, add weight or resistance band to exercises, SciFit, assess ergonomics of pt's desk setup if photo provided   Excell Seltzer, PT, DPT, CSRS 02/13/2022, 10:32 AM

## 2022-02-19 ENCOUNTER — Encounter: Payer: Self-pay | Admitting: Orthopaedic Surgery

## 2022-02-19 ENCOUNTER — Ambulatory Visit: Payer: Medicare PPO | Admitting: Orthopaedic Surgery

## 2022-02-19 DIAGNOSIS — M47812 Spondylosis without myelopathy or radiculopathy, cervical region: Secondary | ICD-10-CM | POA: Diagnosis not present

## 2022-02-19 NOTE — Progress Notes (Signed)
Office Visit Note   Patient: Tracey Gallegos           Date of Birth: Dec 26, 1955           MRN: [id number] Visit Date: 02/19/2022              Requested by: Ladell Pier, MD [address] [place],  [place] [address] PCP: Ladell Pier, MD   Assessment & Plan: Visit Diagnoses:  1. Spondylosis without myelopathy or radiculopathy, cervical region     Plan: Patient's plain radiographs from June shows multilevel spondylosis C2-C7 with multilevel spurring some reversal of curvature multilevel disc space narrowing endplate sclerosis.  Oblique radiographs demonstrate foraminal narrowing multiple levels.  Currently she has no myelopathic symptoms no radicular symptoms.  If these develop she can return.  Follow-Up Instructions: Return if symptoms worsen or fail to improve.   Orders:  No orders of the defined types were placed in this encounter.  No orders of the defined types were placed in this encounter.     Procedures: No procedures performed   Clinical Data: No additional findings.   Subjective: Chief Complaint  Patient presents with   Neck - Pain    HPI 66 year old female new patient with neck pain present x4 to 5 months.  No injury no numbness or tingling.  She did 1 session of therapy which is helping.  She is used topical creams.  No previous surgeries.  Patient does have diabetes on insulin also hypertension coronary artery disease.  Patient does not think that her symptoms are really increased.  Past history of heart failure.  Review of Systems no fever chills no bowel or bladder associated symptoms.  All other systems noncontributory to HPI.   Objective: Vital Signs: BP (!) 146/84   Pulse 80   Ht '5\' 8"'$  (1.727 m)   Wt 299 lb (135.6 kg)   BMI 45.46 kg/m   Physical Exam Constitutional:      Appearance: She is well-developed.  HENT:     Head: Normocephalic.     Right Ear: External ear normal.     Left Ear: External ear normal. There is no  impacted cerumen.  Eyes:     Pupils: Pupils are equal, round, and reactive to light.  Neck:     Thyroid: No thyromegaly.     Trachea: No tracheal deviation.  Cardiovascular:     Rate and Rhythm: Normal rate.  Pulmonary:     Effort: Pulmonary effort is normal.  Abdominal:     Palpations: Abdomen is soft.  Musculoskeletal:     Cervical back: No rigidity.  Skin:    General: Skin is warm and dry.  Neurological:     Mental Status: She is alert and oriented to person, place, and time.  Psychiatric:        Behavior: Behavior normal.     Ortho Exam patient has 75% rotation good flexion extension cervical spine.  Upper 70 reflexes are 2+ and symmetrical.  Specialty Comments:  No specialty comments available.  Imaging: Narrative & Impression  CLINICAL DATA:  Atraumatic left-sided cervical pain radiating down the left shoulder x3 months.   EXAM: CERVICAL SPINE - COMPLETE 4+ VIEW   COMPARISON:  None Available.   FINDINGS: There is no evidence of an acute cervical spine fracture or prevertebral soft tissue swelling. There is mild reversal of the normal cervical spine lordosis. Marked severity endplate sclerosis and anterior osteophyte formation are seen throughout all levels of the  cervical spine. There is marked severity narrowing of the anterior atlantoaxial articulation. Marked severity intervertebral disc space narrowing is seen at the levels of C2-C3, C3-C4, C5-C6 and C6-C7. Moderate severity intervertebral disc space narrowing is noted at C4-C5.   IMPRESSION: 1. No evidence of an acute cervical spine fracture or subluxation. 2. 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     Electronically Signed   By: Virgina Norfolk M.D.   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   Dyslipidemia, goal LDL below 70 12/09/2019   Papanicolaou smear of cervix with low risk human papillomavirus (HPV) DNA test positive 01/21/2019   Influenza vaccination declined 07/20/2018   Chronic combined systolic and diastolic CHF (congestive heart failure) ([place]) 07/20/2018   Positive depression screening 07/20/2018   CAD (coronary artery disease) 07/09/2018   Hypertensive heart disease with acute on chronic systolic congestive heart failure ([place]) 06/24/2018   Type 2 diabetes mellitus without complication, without long-term current use of insulin ([place]) 06/24/2018   Morbid obesity ([place])    Past Medical History:  Diagnosis Date   CHF (congestive heart failure) (HCC)    Coronary artery disease    Diabetes mellitus without complication (HCC)    Dyspnea    Hypertension     Family History  Problem Relation Age of Onset   Diabetes Mother    Colon cancer Brother     Past Surgical History:  Procedure Laterality Date   CARDIAC CATHETERIZATION  06/28/2018   CORONARY STENT INTERVENTION N/A 06/28/2018   Procedure: CORONARY STENT INTERVENTION;  Surgeon: Leonie Man, MD;  Location: [facility];  Service: Cardiovascular;  Laterality: N/A;   CORONARY STENT PLACEMENT  06/28/2018   RIGHT/LEFT HEART CATH AND CORONARY ANGIOGRAPHY N/A 06/28/2018   Procedure: RIGHT/LEFT HEART CATH AND CORONARY ANGIOGRAPHY;  Surgeon: Leonie Man, MD;  Location: [facility];  Service: Cardiovascular;  Laterality: N/A;   TOTAL KNEE ARTHROPLASTY Left 05/2016   Social History   Occupational History   Occupation: retired  Tobacco Use   Smoking status: Never   Smokeless tobacco: Never  Vaping Use   Vaping Use: Never used  Substance and Sexual Activity   Alcohol use: Yes    Comment: occasionally   Drug use: Never   Sexual activity: Not Currently

## 2022-02-20 ENCOUNTER — Ambulatory Visit: Payer: Medicare PPO | Attending: Psychiatry

## 2022-02-20 DIAGNOSIS — M542 Cervicalgia: Secondary | ICD-10-CM | POA: Diagnosis not present

## 2022-02-20 NOTE — Therapy (Signed)
OUTPATIENT PHYSICAL THERAPY CERVICAL TREATMENT   Patient Name: Tracey Gallegos MRN: [id number] DOB:09-Nov-1955, 66 y.o., female Today's Date: 02/20/2022   PT End of Session - 02/20/22 0928     Visit Number 3    Number of Visits 5    Date for PT Re-Evaluation 03/11/22    Authorization Type [employer]    PT Start Time [id number]    PT Stop Time 1010    PT Time Calculation (min) 39 min    Activity Tolerance Patient tolerated treatment well    Behavior During Therapy [facility] for tasks assessed/performed              Past Medical History:  Diagnosis Date   CHF (congestive heart failure) ([place])    Coronary artery disease    Diabetes mellitus without complication ([place])    Dyspnea    Hypertension    Past Surgical History:  Procedure Laterality Date   CARDIAC CATHETERIZATION  06/28/2018   CORONARY STENT INTERVENTION N/A 06/28/2018   Procedure: CORONARY STENT INTERVENTION;  Surgeon: Leonie Man, MD;  Location: [facility];  Service: Cardiovascular;  Laterality: N/A;   CORONARY STENT PLACEMENT  06/28/2018   RIGHT/LEFT HEART CATH AND CORONARY ANGIOGRAPHY N/A 06/28/2018   Procedure: RIGHT/LEFT HEART CATH AND CORONARY ANGIOGRAPHY;  Surgeon: Leonie Man, MD;  Location: [facility];  Service: Cardiovascular;  Laterality: N/A;   TOTAL KNEE ARTHROPLASTY Left 05/2016   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/19/2020   Dyslipidemia, goal LDL below 70 12/09/2019   Papanicolaou smear of cervix with low risk human papillomavirus (HPV) DNA test positive 01/21/2019   Influenza vaccination declined 07/20/2018   Chronic combined systolic and diastolic CHF (congestive heart failure) ([place]) 07/20/2018   Positive depression screening 07/20/2018   CAD (coronary artery disease) 07/09/2018   Hypertensive heart disease with acute on chronic  systolic congestive heart failure ([place]) 06/24/2018   Type 2 diabetes mellitus without complication, without long-term current use of insulin ([place]) 06/24/2018   Morbid obesity ([place])     PCP: Ladell Pier, MD  REFERRING PROVIDER: Genia Harold, MD   REFERRING DIAG: M54.2 (ICD-10-CM) - Cervicalgia   THERAPY DIAG:  Cervicalgia  Rationale for Evaluation and Treatment Rehabilitation  ONSET DATE: 01/23/2022 (referral)   SUBJECTIVE:  SUBJECTIVE STATEMENT: Patient reports doing well- had 1 minor HA, L suboccipitals. 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   PERTINENT HISTORY: CAD, CHF, HTN, DM2    PAIN:  Are you having pain? No   TODAY'S TREATMENT:   THER EX: -scifit level 5 x8 mins B UE/LE -resisted scap retractions 3x12  -resisted ER 3x12  -resisted UE D2 PNF 2x12 (incr difficulty with L UE) -self suboccipital stretching with towel  -supine chin tuck with BP cuff used as feed back (5-59mHg increase noted)   Manual: - supine suboccipital release   PATIENT EDUCATION:  Education details: initiated HEP, discussed ergonomic setup of desk to decrease neck and shoulder pain, NDI Person educated: Patient Education method: Explanation, Demonstration, Tactile cues, and Handouts Education comprehension: verbalized understanding   HOME EXERCISE PROGRAM: Access Code: [email or web address] URL: [email or web address] Date: 02/13/2022 Prepared by: TExcell Seltzer Exercises - Seated Scapular Retraction  - 1 x daily - 7 x weekly - 3 sets - 10 reps - Seated Shoulder Flexion  - 1 x daily - 7 x weekly - 3 sets - 10 reps - Seated Shoulder Abduction - Thumbs Up  - 1 x daily - 7 x weekly - 3 sets - 10 reps - Seated Shoulder Scaption AROM  - 1 x daily - 7 x weekly  - 3 sets - 10 reps - Seated Gentle Upper Trapezius Stretch  - 1 x daily - 7 x weekly - 1 sets - 3-5 reps - 30-60 hold  ASSESSMENT:  CLINICAL IMPRESSION: Patient seen for skilled PT session with emphasis on cervical muscle re-training. Patient tolerating exericse progressions well with added resistance. Minimal increase in tone noted to suboccipitals upon palpation. Continue POC.    OBJECTIVE IMPAIRMENTS decreased ROM, impaired UE functional use, improper body mechanics, postural dysfunction, and pain.   ACTIVITY LIMITATIONS lifting, sleeping, and reach over head  PARTICIPATION LIMITATIONS: occupation  [place] are also affecting patient's functional outcome.   REHAB POTENTIAL: Good  CLINICAL DECISION MAKING: Stable/uncomplicated  EVALUATION COMPLEXITY: Low   GOALS: Goals reviewed with patient? Yes  SHORT TERM GOALS = LTG due to POC length: Target date: 03/04/2022   Pt will be independent with final HEP for improved strength, balance, transfers and gait. Baseline: Goal status: INITIAL  2.  Pt will increase L cervical rotation to >/= 45 degrees for improved functional ROM. Baseline: 38 degrees Goal status: INITIAL  3.  Pt will increase cervical flexion to >/= 50 degrees for improved functional ROM. Baseline: 40 degrees (02/13/22) Goal status: INITIAL  4.  Pt to demonstrate decreased disability by improving NDI score to 1/50 or 2% disabled (no disability) Baseline: 6/50, 12% mild disability (02/13/22) Goal status: INITIAL    PLAN: PT FREQUENCY: 1x/week  PT DURATION: 4 weeks  PLANNED INTERVENTIONS: Therapeutic exercises, Therapeutic activity, Neuromuscular re-education, Patient/Family education, Self Care, Joint mobilization, Dry Needling, Moist heat, and Manual therapy  PLAN FOR NEXT SESSION: dry needling?, shoulder rolls, I'sY'sT's in prone if pt can tolerate, add weight or resistance band to exercises, SciFit, assess ergonomics of pt's  desk setup if photo provided   JDebbora Dus PT, DPT JDebbora Dus PT, DPT, CBIS  02/20/2022, 10:13 AM

## 2022-02-22 ENCOUNTER — Other Ambulatory Visit: Payer: Self-pay | Admitting: Internal Medicine

## 2022-02-22 DIAGNOSIS — E1159 Type 2 diabetes mellitus with other circulatory complications: Secondary | ICD-10-CM

## 2022-02-24 ENCOUNTER — Other Ambulatory Visit: Payer: Self-pay

## 2022-02-24 MED ORDER — ACCU-CHEK GUIDE VI STRP
ORAL_STRIP | 2 refills | Status: DC
  Filled 2022-02-24: qty 100, 90d supply, fill #0
  Filled 2022-08-29: qty 100, 90d supply, fill #1
  Filled 2022-09-05: qty 50, 50d supply, fill #1
  Filled 2022-12-15: qty 50, 50d supply, fill #2

## 2022-02-24 NOTE — Telephone Encounter (Signed)
Requested medication (s) are due for refill today: yes  Requested medication (s) are on the active medication list: no  Last refill:  09/24/21  Future visit scheduled: yes  Notes to clinic:  rx not on pt's med list. Please advise     Requested Prescriptions  Pending Prescriptions Disp Refills   glucose blood (ACCU-CHEK GUIDE) test strip 100 strip 2    Sig: USE TO CHECK BLOOD SUGAR DAILY. E11.9     Endocrinology: Diabetes - Testing Supplies Passed - 02/22/2022 11:20 AM      Passed - Valid encounter within last 12 months    Recent Outpatient Visits           1 week ago Essential hypertension   [place], Jarome Matin, RPH-CPP   1 month ago Acute neck pain   [place] Ladell Pier, MD   2 months ago Acute neck pain   [place] Karle Plumber B, MD   6 months ago Type 2 diabetes mellitus with other circulatory complication, without long-term current use of insulin [facility])   [place], Deborah B, MD   1 year ago Type 2 diabetes mellitus with other circulatory complication, without long-term current use of insulin [facility])   [place], Deborah B, MD       Future Appointments             In 3 weeks Tresa Endo, [place]   In 2 months Wynetta Emery, Dalbert Batman, MD [place]

## 2022-02-25 ENCOUNTER — Ambulatory Visit
Admission: RE | Admit: 2022-02-25 | Discharge: 2022-02-25 | Disposition: A | Discharge status: Home / Self Care | Payer: Medicare PPO | Source: Ambulatory Visit | Attending: Psychiatry | Admitting: Psychiatry

## 2022-02-25 ENCOUNTER — Other Ambulatory Visit: Payer: Self-pay

## 2022-02-25 DIAGNOSIS — I639 Cerebral infarction, unspecified: Secondary | ICD-10-CM | POA: Diagnosis not present

## 2022-02-25 MED ORDER — IOPAMIDOL (ISOVUE-370) INJECTION 76%
75.0000 mL | Freq: Once | INTRAVENOUS | Status: AC | PRN
  Administered 2022-02-25: 75 mL via INTRAVENOUS

## 2022-02-26 ENCOUNTER — Other Ambulatory Visit: Payer: Self-pay | Admitting: Psychiatry

## 2022-02-26 ENCOUNTER — Other Ambulatory Visit: Payer: Self-pay

## 2022-02-26 MED ORDER — ATORVASTATIN CALCIUM 80 MG PO TABS
80.0000 mg | ORAL_TABLET | Freq: Every day | ORAL | 6 refills | Status: DC
  Filled 2022-02-26: qty 30, 30d supply, fill #0
  Filled 2022-04-15: qty 30, 30d supply, fill #1
  Filled 2022-05-14: qty 30, 30d supply, fill #2
  Filled 2022-06-20: qty 30, 30d supply, fill #3
  Filled 2022-07-21: qty 30, 30d supply, fill #4
  Filled 2022-08-19: qty 30, 30d supply, fill #5
  Filled 2022-09-23: qty 30, 30d supply, fill #6

## 2022-02-27 ENCOUNTER — Ambulatory Visit: Payer: Medicare PPO | Admitting: Physical Therapy

## 2022-03-06 ENCOUNTER — Ambulatory Visit: Payer: Medicare PPO | Admitting: Physical Therapy

## 2022-03-06 ENCOUNTER — Encounter: Payer: Self-pay | Admitting: Physical Therapy

## 2022-03-06 NOTE — Therapy (Unsigned)
[facility] [address] [place], [place], [address] Phone: [phone]   Fax:  [phone]  Patient Details  Name: Tracey Gallegos MRN: [id number] Date of Birth: January 21, 1956 Referring Provider:  No ref. provider found  Encounter Date: 03/06/2022  PHYSICAL THERAPY DISCHARGE SUMMARY  Visits from Start of Care: 3  Current functional level related to goals / functional outcomes: Independent, no pain limiting function    Remaining deficits: Pt cancelled remainder of appointments due to stating she no longer needs it as she is no longer having pain    Education / Equipment: HEP   Patient agrees to discharge. Patient goals were  not assessed as pt cancelled remainder of appointments . Patient is being discharged due to the patient's request.   Tracey Gallegos, PT, DPT 03/06/2022, 9:53 AM  [facility] [address] [place], [place], [address] Phone: [phone]   Fax:  [phone]

## 2022-03-18 NOTE — Progress Notes (Unsigned)
   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  PMH is significant for CHF, CAD, T2DM, obesity, and HTN.   Patient was referred and last seen by Primary Care Provider, Dr. Karle Plumber, on 01/16/22. BP was 167/98 at this visit and Entresto dose was increased from 49/'51mg'$  BID to 97/'103mg'$  BID.   At last visit with the pharmacy team, her BP was close to goal with a reading of 125/81. patient reported that she had not started the higher dose Entresto. She receives pt assistance and had not received it or been told if it was approved.   Today, patient arrives in *** spirits and presents without *** assistance. *** Denies dizziness, headache, blurred vision, swelling.   Patient reports hypertension was diagnosed 20-25 years ago.   Family/Social history: nonsmoker, family hx of DM  Medication adherence *** . Patient has *** taken BP medications today.   Current antihypertensives include: amlodipine '10mg'$  daily, furosemide '40mg'$  daily, metoprolol succinate '50mg'$  daily, Entresto 97/103 mg BID   Antihypertensives tried in the past include: lisinopril (2019 - discontinued due to cough), losartan (discontinued to start Entresto in 2021)   Reported home BP readings: ***  Patient reported dietary habits: Eats *** meals/day Breakfast: *** Lunch: *** Dinner: *** Snacks: *** Drinks: ***  Patient-reported exercise habits: ***   O:  Last 3 Office BP readings: BP Readings from Last 3 Encounters:  02/19/22 (!) 146/84  02/11/22 125/81  01/23/22 140/78    BMET    Component Value Date/Time   NA 144 12/20/2021 1643   K 4.9 12/20/2021 1643   CL 103 12/20/2021 1643   CO2 24 12/20/2021 1643   GLUCOSE 135 (H) 12/20/2021 1643   GLUCOSE 136 (H) 06/30/2018 0401   BUN 16 12/20/2021 1643   CREATININE 0.97 12/20/2021 1643   CALCIUM 9.9 12/20/2021 1643   GFRNONAA 52 (L) 01/31/2020 1620   GFRAA 60 01/31/2020 1620    A/P: Hypertension diagnosed *** currently *** on current medications. BP goal < 130/80 *** mmHg. Medication adherence appears ***. Control is suboptimal due to ***.  -{Meds adjust:18428} ***.  -Patient educated on purpose, proper use, and potential adverse effects of ***.  -F/u labs ordered - *** -Counseled on lifestyle modifications for blood pressure control including reduced dietary sodium, increased exercise, adequate sleep. -Encouraged patient to check BP at home and bring log of readings to next visit. Counseled on proper use of home BP cuff.    Results reviewed and written information provided.    Written patient instructions provided. Patient verbalized understanding of treatment plan.  Total time in face to face counseling *** minutes.    Follow-up:  Pharmacist ***. PCP clinic visit in 10/6

## 2022-03-20 ENCOUNTER — Ambulatory Visit: Payer: Medicare PPO | Attending: Internal Medicine | Admitting: Pharmacist

## 2022-03-20 VITALS — BP 135/85

## 2022-03-20 DIAGNOSIS — I10 Essential (primary) hypertension: Secondary | ICD-10-CM

## 2022-04-02 ENCOUNTER — Other Ambulatory Visit: Payer: Self-pay

## 2022-04-02 ENCOUNTER — Other Ambulatory Visit: Payer: Self-pay | Admitting: Internal Medicine

## 2022-04-02 DIAGNOSIS — E1159 Type 2 diabetes mellitus with other circulatory complications: Secondary | ICD-10-CM

## 2022-04-02 DIAGNOSIS — I5042 Chronic combined systolic (congestive) and diastolic (congestive) heart failure: Secondary | ICD-10-CM

## 2022-04-02 MED ORDER — GLIPIZIDE 5 MG PO TABS
5.0000 mg | ORAL_TABLET | Freq: Two times a day (BID) | ORAL | 1 refills | Status: DC
  Filled 2022-04-02: qty 180, 90d supply, fill #0
  Filled 2022-06-29: qty 180, 90d supply, fill #1

## 2022-04-02 MED ORDER — FUROSEMIDE 40 MG PO TABS
40.0000 mg | ORAL_TABLET | Freq: Every day | ORAL | 1 refills | Status: DC
  Filled 2022-04-02: qty 90, 90d supply, fill #0
  Filled 2022-10-13 – 2022-10-20 (×2): qty 90, 90d supply, fill #1

## 2022-04-04 ENCOUNTER — Other Ambulatory Visit: Payer: Self-pay

## 2022-04-15 ENCOUNTER — Other Ambulatory Visit: Payer: Self-pay

## 2022-04-20 NOTE — Progress Notes (Unsigned)
Cardiology Clinic Note   Patient Name: Tracey Gallegos Date of Encounter: 04/22/2022  Primary Care Provider:  Marcine Matar, MD Primary Cardiologist:  Chrystie Nose, MD  Patient Profile    Tracey Gallegos 66 year old female presents the clinic today for follow-up evaluation of her coronary artery disease and hypertension.  Past Medical History    Past Medical History:  Diagnosis Date   CHF (congestive heart failure) (HCC)    Coronary artery disease    Diabetes mellitus without complication (HCC)    Dyspnea    Hypertension    Past Surgical History:  Procedure Laterality Date   CARDIAC CATHETERIZATION  06/28/2018   CORONARY STENT INTERVENTION N/A 06/28/2018   Procedure: CORONARY STENT INTERVENTION;  Surgeon: Marykay Lex, MD;  Location: [facility] INVASIVE CV LAB;  Service: Cardiovascular;  Laterality: N/A;   CORONARY STENT PLACEMENT  06/28/2018   RIGHT/LEFT HEART CATH AND CORONARY ANGIOGRAPHY N/A 06/28/2018   Procedure: RIGHT/LEFT HEART CATH AND CORONARY ANGIOGRAPHY;  Surgeon: Marykay Lex, MD;  Location: [facility] INVASIVE CV LAB;  Service: Cardiovascular;  Laterality: N/A;   TOTAL KNEE ARTHROPLASTY Left 05/2016    Allergies  Allergies  Allergen Reactions   Lisinopril Cough    History of Present Illness    Tracey Gallegos has a PMH of hypertension, CAD, chronic combined systolic and diastolic CHF, type 2 diabetes, obesity, depression, hyperlipidemia, and postmenopausal bleeding.  She was seen in follow-up by Dr. Rennis Golden on 09/05/2021.  She presented for follow-up evaluation of her CHF.  She reported that she had been out of her Entresto for around 1 month.  She was noticing increasing lower extremity edema and weight gain.  She was felt to be euvolemic and her primary care provider's office.  She reported that she did not get short of breath.  She did previously note short of breath with low EF.  Her EF had normalized on her echocardiogram in 2020.  She reported compliance with  amlodipine, metoprolol and furosemide.  There was a cost issue with her Entresto.  She had been initially getting free with paperwork.  She had reapplied for assistance but had not yet heard a reply.  It was felt that her symptoms of CHF were stable.  Her blood pressure was elevated.  She had previously been on 49/51 twice daily of Entresto.  She was given samples.  Follow-up was planned for 6 months.  She presents to the clinic today for follow-up evaluation and states she is tolerating her medications well.  She reports slight lower extremity swelling.  She has been trying to get to the [facility] 2-3 times per week.  She enjoys walking on the track and using a stationary bike.  She does have some dietary discretion related to sodium when she eats out.  Her blood pressure initially today is 144/86 and on recheck is 126/74.  We reviewed her lipid panel from June which shows an LDL of 121.  She reports compliance with her atorvastatin.  We will recheck her fasting lipids and LFTs.  I recommended that she start ezetimibe if her cholesterol is not at goal.  She wishes to discuss the new medication with her PCP.  We will plan follow-up in 6 months.  I will give her the salty 6 diet sheet as well.  Today she denies chest pain, shortness of breath, fatigue, palpitations, melena, hematuria, hemoptysis, diaphoresis, weakness, presyncope, syncope, orthopnea, and PND.    Home Medications    Prior to Admission medications  Medication Sig Start Date End Date Taking? Authorizing Provider  furosemide (LASIX) 40 MG tablet Take 1 tablet (40 mg total) by mouth daily. 04/02/22   Ladell Pier, MD  glipiZIDE (GLUCOTROL) 5 MG tablet Take 1 tablet (5 mg total) by mouth 2 (two) times daily before a meal. 04/02/22   Ladell Pier, MD  Accu-Chek Softclix Lancets lancets Use to check blood sugar daily. E11.9 03/20/20   Ladell Pier, MD  amLODipine (NORVASC) 10 MG tablet Take 1 tablet (10 mg total) by mouth daily.  08/22/21   Ladell Pier, MD  aspirin (ASPIRIN 81) 81 MG EC tablet Take 1 tablet (81 mg total) by mouth daily. 01/31/20   Ladell Pier, MD  atorvastatin (LIPITOR) 80 MG tablet Take 1 tablet (80 mg total) by mouth daily. 02/26/22   Genia Harold, MD  Blood Glucose Monitoring Suppl (ACCU-CHEK GUIDE ME) w/Device KIT Use to check blood sugar daily. E11.9 03/20/20   Ladell Pier, MD  butalbital-acetaminophen-caffeine (FIORICET) [phone] MG tablet Take 1-2 tablets by mouth 2 (two) times daily as needed for headache. 12/20/21   Ladell Pier, MD  diclofenac Sodium (VOLTAREN) 1 % GEL Apply 2 g topically 4 (four) times daily. 12/20/21   Ladell Pier, MD  glucose blood (ACCU-CHEK GUIDE) test strip USE TO CHECK BLOOD SUGAR DAILY. 02/24/22 02/21/23  Ladell Pier, MD  metoprolol succinate (TOPROL-XL) 50 MG 24 hr tablet Take 1 tablet (50 mg total) by mouth daily. 01/16/22   Ladell Pier, MD  Multiple Vitamins-Minerals (MULTIVITAMIN WITH MINERALS) tablet Take 1 tablet by mouth daily.    [provider]  Omega-3 Fatty Acids (FISH OIL PO) Take 1,000 mg by mouth daily.     [provider]  potassium chloride (KLOR-CON) 10 MEQ tablet TAKE 1 TABLET TWICE DAILY 10/01/21   Ladell Pier, MD  PRENAT-FECBN-FEBISG-FA-FISHOIL PO Take by mouth. Patient not taking: Reported on 01/16/2022    [provider]  sacubitril-valsartan (ENTRESTO) 97-103 MG Take 1 tablet by mouth 2 (two) times daily. 02/11/22   Ladell Pier, MD    Family History    Family History  Problem Relation Age of Onset   Diabetes Mother    Colon cancer Brother    She indicated that her mother is alive. She indicated that her father is deceased. She indicated that her brother is deceased.  Social History    Social History   Socioeconomic History   Marital status: Single    Spouse name: Not on file   Number of children: 0   Years of education: Not on file   Highest education level:  Not on file  Occupational History   Occupation: retired  Tobacco Use   Smoking status: Never   Smokeless tobacco: Never  Vaping Use   Vaping Use: Never used  Substance and Sexual Activity   Alcohol use: Yes    Comment: occasionally   Drug use: Never   Sexual activity: Not Currently  Other Topics Concern   Not on file  Social History Narrative   Right handed   Caffeine Intake 1 cup of coffee occassionally   Social Determinants of Health   Financial Resource Strain: Low Risk  (03/23/2021)   Overall Financial Resource Strain (CARDIA)    Difficulty of Paying Living Expenses: Not hard at all  Food Insecurity: No Food Insecurity (03/23/2021)   Hunger Vital Sign    Worried About Running Out of Food in the Last Year: Never true  Ran Out of Food in the Last Year: Never true  Transportation Needs: No Transportation Needs (03/23/2021)   PRAPARE - Administrator, Civil Service (Medical): No    Lack of Transportation (Non-Medical): No  Physical Activity: Sufficiently Active (03/23/2021)   Exercise Vital Sign    Days of Exercise per Week: 5 days    Minutes of Exercise per Session: 80 min  Stress: No Stress Concern Present (03/23/2021)   [employer] of Occupational Health - Occupational Stress Questionnaire    Feeling of Stress : Not at all  Social Connections: Moderately Isolated (03/23/2021)   Social Connection and Isolation Panel [NHANES]    Frequency of Communication with Friends and Family: Three times a week    Frequency of Social Gatherings with Friends and Family: Never    Attends Religious Services: Never    Database administrator or Organizations: Yes    Attends Banker Meetings: Never    Marital Status: Never married  Intimate Partner Violence: Not At Risk (03/23/2021)   Humiliation, Afraid, Rape, and Kick questionnaire    Fear of Current or Ex-Partner: No    Emotionally Abused: No    Physically Abused: No    Sexually Abused: No     Review of  Systems    General:  No chills, fever, night sweats or weight changes.  Cardiovascular:  No chest pain, dyspnea on exertion, edema, orthopnea, palpitations, paroxysmal nocturnal dyspnea. Dermatological: No rash, lesions/masses Respiratory: No cough, dyspnea Urologic: No hematuria, dysuria Abdominal:   No nausea, vomiting, diarrhea, bright red blood per rectum, melena, or hematemesis Neurologic:  No visual changes, wkns, changes in mental status. All other systems reviewed and are otherwise negative except as noted above.  Physical Exam    VS:  BP 126/74   Pulse 69   Ht 5\' 8"  (1.727 m)   Wt 297 lb 6.4 oz (134.9 kg)   SpO2 98%   BMI 45.22 kg/m  , BMI Body mass index is 45.22 kg/m. GEN: Well nourished, well developed, in no acute distress. HEENT: normal. Neck: Supple, no JVD, carotid bruits, or masses. Cardiac: RRR, no murmurs, rubs, or gallops. No clubbing, cyanosis, edema.  Radials/DP/PT 2+ and equal bilaterally.  Respiratory:  Respirations regular and unlabored, clear to auscultation bilaterally. GI: Soft, nontender, nondistended, BS + x 4. MS: no deformity or atrophy. Skin: warm and dry, no rash. Neuro:  Strength and sensation are intact. Psych: Normal affect.  Accessory Clinical Findings    Recent Labs: 12/20/2021: ALT 18; BUN 16; Creatinine, Ser 0.97; Hemoglobin 14.8; Platelets 238; Potassium 4.9; Sodium 144   Recent Lipid Panel    Component Value Date/Time   CHOL 194 12/20/2021 1643   TRIG 98 12/20/2021 1643   HDL 55 12/20/2021 1643   CHOLHDL 3.5 12/20/2021 1643   CHOLHDL 5.5 04/27/2018 1513   VLDL 17 04/27/2018 1513   LDLCALC 121 (H) 12/20/2021 1643         ECG personally reviewed by me today-normal sinus rhythm 67 bpm- No acute changes  Echocardiogram 05/18/2019 IMPRESSIONS     1. EF now normal ~55% compared with 12/29/2018.   2. Left ventricular ejection fraction, by visual estimation, is 55 to  60%. The left ventricle has normal function. There is  mildly increased  left ventricular hypertrophy.   3. LVEF by 3D 55%.   4. Global right ventricle has normal systolic function.The right  ventricular size is normal. No increase in right ventricular wall  thickness.  5. Right atrial size was normal.   6. The pericardial effusion is circumferential.   7. Trivial pericardial effusion is present.   8. The tricuspid valve is grossly normal. Tricuspid valve regurgitation  is trivial.   9. Mild plaque invoving the ascending aorta.  10. Left atrial size was normal.  11. The aortic valve was not well visualized. Aortic valve regurgitation  is not visualized.  12. The mitral valve is normal in structure. No evidence of mitral valve  regurgitation. No evidence of mitral stenosis.  13. Mild mitral annular calcification.  14. The pulmonic valve was not well visualized. Pulmonic valve  regurgitation is not visualized.  Cardiac catheterization 06/28/2018 Prox Cx to Mid Cx lesion is 90% stenosed with 90% stenosed side branch in Ost 1st Mrg. A drug-eluting stent was successfully placed in the Ost 1st Mrg using a STENT SIERRA 2.25 X 23 MM (postdilated to 2.4 mm). Post intervention, there is a 0% residual stenosis. A drug-eluting stent was successfully placed in the p-mCx using a STENT SIERRA 2.50 X 28 MM (postdilated to 2.9 mm). Post intervention, the side branch was reduced to 0% residual stenosis. Ost Ramus to Ramus lesion is 55% stenosed. LV end diastolic pressure is severely elevated (acute on chronic diastolic heart failure). With known severely reduced EF by Echo (acute on chronic systolic heart failure) Hemodynamic findings consistent with moderate pulmonary hypertension. -LVEDP of 29 mmHg with wedge pressure of 242-58mmHg -> consistent with acute on chronic combined systolic and diastolic heart failure   SUMMARY:  Severe DILATED CARDIOMYOPATHY per echocardiogram with evidence of ACUTE ON CHRONIC COMBINED SYSTOLIC AND DIASTOLIC HEART FAILURE:  Elevated LVEDP of 29 million mercury with a wedge pressure of 24-26 mmHg. 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 Moderate 55% stenosis in prox RI. Moderate Secondary Pulmonary HTN    RECOMMENDATIONS: Transfer to 6 Central post procedure unit for post PCI care.  Was given additional 40 mg IV Lasix +10 mg IV hydralazine upon arrival to the floor for systemic hypertension and worsening dyspnea. Continue IV diuresis given severely elevated LVEDP -> we will give her a.m. dose upon arrival back to the floor.  (Suspect that she will need several more days of IV diuresis) Continue aggressive CHF management with hypertension management and rate control. Continue statin -target LDL <50   Would continue dual antiplatelet therapy aspirin and Brilinta for at least 6 months.  Would be okay to stop aspirin after 3 months if necessary,.  Okay to hold Brilinta at 6 months for procedures, but would preferably.  Continue on for a minimum 1 year at treatment dose and at reduced dose for an additional year based on bifurcation stenting.   Anticipate that she will require diuresis for several days prior to discharge.         [name], M.D., M.S. Interventional Cardiologist   Pager # [phone] Phone # [phone] [address] [place], [place] [address]  Diagnostic Dominance: Right  Intervention      Assessment & Plan   1.  Chronic combined systolic and diastolic CHF-euvolemic today.  Weight stable.  Back on Entresto.  Reports compliance and is now again receiving assistance. Continue Entresto, metoprolol, furosemide Heart healthy low-sodium diet-salty 6 given Increase physical activity as  tolerated  Coronary artery disease-no chest pain today.  Denies recent episodes  of arm neck back or chest discomfort.  Underwent cardiac catheterization with PCI and DES to her first marginal and circumflex vessels 12/19. Continue amlodipine, metoprolol, atorvastatin, aspirin Heart healthy low-sodium diet-salty 6 given Increase physical activity as tolerated  Hyperlipidemia-LDL 100 on 07/03/21 Continue aspirin, atorvastatin, omega-3 fatty acids Heart healthy low-sodium high-fiber diet Increase physical activity as tolerated  Lipids and LFTs-would recommend adding ezetimibe if LDL continues to be elevated.  Essential hypertension-BP today 126/74 Continue amlodipine, Entresto, metoprolol, furosemide Heart healthy low-sodium diet-salty 6 given Increase physical activity as tolerated   Morbid obesity-weight today 297.4 lbs.  Reports she is more physically active and continue to monitor her diet closely. Continue weight loss  Disposition: Follow-up with Dr. Debara Pickett or me in 6 months.   Jossie Ng. Deforrest Bogle NP-C     04/22/2022, 10:33 AM [place] [address] Office [phone] Fax [phone]  Notice: This dictation was prepared with Dragon dictation along with smaller phrase technology. Any transcriptional errors that result from this process are unintentional and may not be corrected upon review.  I spent 14 minutes examining this patient, reviewing medications, and using patient centered shared decision making involving her cardiac care.  Prior to her visit I spent greater than 20 minutes reviewing her past medical history,  medications, and prior cardiac tests.

## 2022-04-22 ENCOUNTER — Ambulatory Visit: Payer: Medicare PPO | Attending: General Practice | Admitting: General Practice

## 2022-04-22 ENCOUNTER — Ambulatory Visit: Payer: Medicare PPO | Attending: Internal Medicine | Admitting: Pharmacist

## 2022-04-22 ENCOUNTER — Encounter: Payer: Self-pay | Admitting: General Practice

## 2022-04-22 VITALS — BP 122/78 | HR 53

## 2022-04-22 VITALS — BP 126/74 | HR 69 | Ht 68.0 in | Wt 297.4 lb

## 2022-04-22 DIAGNOSIS — I10 Essential (primary) hypertension: Secondary | ICD-10-CM

## 2022-04-22 DIAGNOSIS — E785 Hyperlipidemia, unspecified: Secondary | ICD-10-CM

## 2022-04-22 DIAGNOSIS — I5042 Chronic combined systolic (congestive) and diastolic (congestive) heart failure: Secondary | ICD-10-CM

## 2022-04-22 DIAGNOSIS — I2510 Atherosclerotic heart disease of native coronary artery without angina pectoris: Secondary | ICD-10-CM | POA: Diagnosis not present

## 2022-04-22 NOTE — Progress Notes (Signed)
   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  PMH is significant for CHF, CAD, T2DM, obesity, and HTN.   Patient was referred and last seen by Primary Care Provider, Dr. Karle Plumber, on 01/16/22. Pharmacy saw her 7/25 and 03/20/2022. BP was good on the 03/20/2022 visit. Of note, she saw her Cardiologist this morning and was euvolemic on exam. Her medications were continued.   Today, patient arrives in  spirits and presents without assistance.  Denies dizziness, headache, blurred vision, swelling.   Patient reports hypertension was diagnosed 20-25 years ago.   Family/Social history:  -Family history: DM -Tobacco: nonsmoker -Alcohol: none  Medication adherence appropriate. Patient has taken BP medications today.  Current antihypertensives include: amlodipine '10mg'$  daily, furosemide '40mg'$  daily, metoprolol succinate '50mg'$  daily, Entresto 97/103 mg BID  Antihypertensives tried in the past include: lisinopril (2019 - discontinued due to cough), losartan (discontinued to start Entresto in 2021)   Reported home BP readings: not checking at home.   Patient reported dietary habits: -Compliant with sodium restriction -Denies drinking any caffeine   Patient-reported exercise habits:  [facility]: cycles, weight machines, and treadmill   O:  Vitals:   04/22/22 1444  BP: 122/78  Pulse: (!) 53    Last 3 Office BP readings: BP Readings from Last 3 Encounters:  04/22/22 122/78  04/22/22 126/74  03/20/22 135/85    BMET    Component Value Date/Time   NA 144 12/20/2021 1643   K 4.9 12/20/2021 1643   CL 103 12/20/2021 1643   CO2 24 12/20/2021 1643   GLUCOSE 135 (H) 12/20/2021 1643   GLUCOSE 136 (H) 06/30/2018 0401   BUN 16 12/20/2021 1643   CREATININE 0.97 12/20/2021 1643   CALCIUM 9.9 12/20/2021 1643   GFRNONAA 52 (L) 01/31/2020 1620   GFRAA 60 01/31/2020  1620   A/P: Hypertension diagnosed currently at goal on current medications. BP goal < 130/80 mmHg. Medication adherence appears appropriate. I agree that a MRA should be considered in her regimen. Her EF was 25-30% 04/28/18 and has improved with last EV in 2020 being 55%. She is euvolemic on exam today and denies any LE swelling different than her baseline at home. In the future, we could consider decreasing amlodipine and starting spironolactone to aid LE edema and to optimize GDMT.  -Continued amlodipine '10mg'$  daily, furosemide '40mg'$  daily and metoprolol succinate '50mg'$  daily, Entrest 97/103 mg BID -F/u labs: these are being taken care of by her Cardiology team. -Counseled on lifestyle modifications for blood pressure control including reduced dietary sodium, increased exercise, adequate sleep. -Encouraged patient to check BP at home and bring log of readings to next visit. Counseled on proper use of home BP cuff.    Results reviewed and written information provided.    Written patient instructions provided. Patient verbalized understanding of treatment plan.  Total time in face to face counseling 15 minutes.    Follow-up:  PCP clinic visit on 10/6.  Benard Halsted, PharmD, [facility] [place], [place] [phone]

## 2022-04-22 NOTE — Patient Instructions (Signed)
Medication Instructions:  The current medical regimen is effective;  continue present plan and medications as directed. Please refer to the Current Medication list given to you today.  *If you need a refill on your cardiac medications before your next appointment, please call your pharmacy*   Lab Work: LIPID AND LFT If you have labs (blood work) drawn today and your tests are completely normal, you will receive your results only by:  [place] (if you have MyChart) OR  A paper copy in the mail  If you have any lab test that is abnormal or we need to change your treatment, we will call you to review the results.  Other Instructions PLEASE READ AND FOLLOW ATTACHED  SALTY 6   PLEASE INCREASE PHYSICAL ACTIVITY-AS TOLERATED  Follow-Up: At [facility], you and your health needs are our priority.  As part of our continuing mission to provide you with exceptional heart care, we have created designated Provider Care Teams.  These Care Teams include your primary Cardiologist (physician) and Advanced Practice Providers (APPs -  Physician Assistants and Nurse Practitioners) who all work together to provide you with the care you need, when you need it.  Your next appointment:   6 month(s)  The format for your next appointment:   In Person  Provider:   Pixie Casino, MD    Important Information About Sugar

## 2022-04-23 LAB — HEPATIC FUNCTION PANEL
ALT: 30 IU/L (ref 0–32)
AST: 25 IU/L (ref 0–40)
Albumin: 4 g/dL (ref 3.9–4.9)
Alkaline Phosphatase: 121 IU/L (ref 44–121)
Bilirubin Total: 0.5 mg/dL (ref 0.0–1.2)
Bilirubin, Direct: 0.16 mg/dL (ref 0.00–0.40)
Total Protein: 6.9 g/dL (ref 6.0–8.5)

## 2022-04-23 LAB — LIPID PANEL
Chol/HDL Ratio: 3.3 ratio (ref 0.0–4.4)
Cholesterol, Total: 157 mg/dL (ref 100–199)
HDL: 48 mg/dL (ref >39)
LDL Chol Calc (NIH): 91 mg/dL (ref 0–99)
Triglycerides: 99 mg/dL (ref 0–149)
VLDL Cholesterol Cal: 18 mg/dL (ref 5–40)

## 2022-04-25 ENCOUNTER — Ambulatory Visit: Payer: Medicare PPO | Attending: Internal Medicine | Admitting: Internal Medicine

## 2022-04-25 ENCOUNTER — Encounter: Payer: Self-pay | Admitting: Internal Medicine

## 2022-04-25 ENCOUNTER — Other Ambulatory Visit: Payer: Self-pay

## 2022-04-25 DIAGNOSIS — E785 Hyperlipidemia, unspecified: Secondary | ICD-10-CM

## 2022-04-25 DIAGNOSIS — Z2821 Immunization not carried out because of patient refusal: Secondary | ICD-10-CM | POA: Diagnosis not present

## 2022-04-25 DIAGNOSIS — E1169 Type 2 diabetes mellitus with other specified complication: Secondary | ICD-10-CM | POA: Diagnosis not present

## 2022-04-25 DIAGNOSIS — I6501 Occlusion and stenosis of right vertebral artery: Secondary | ICD-10-CM

## 2022-04-25 DIAGNOSIS — I2510 Atherosclerotic heart disease of native coronary artery without angina pectoris: Secondary | ICD-10-CM

## 2022-04-25 DIAGNOSIS — E1159 Type 2 diabetes mellitus with other circulatory complications: Secondary | ICD-10-CM | POA: Diagnosis not present

## 2022-04-25 DIAGNOSIS — I152 Hypertension secondary to endocrine disorders: Secondary | ICD-10-CM | POA: Diagnosis not present

## 2022-04-25 LAB — POCT GLYCOSYLATED HEMOGLOBIN (HGB A1C): HbA1c, POC (controlled diabetic range): 8.3 % — AB (ref 0.0–7.0)

## 2022-04-25 LAB — GLUCOSE, POCT (MANUAL RESULT ENTRY): POC Glucose: 179 mg/dl — AB (ref 70–99)

## 2022-04-25 MED ORDER — RYBELSUS 3 MG PO TABS
3.0000 mg | ORAL_TABLET | Freq: Every day | ORAL | 5 refills | Status: DC
  Filled 2022-04-25: qty 30, fill #0

## 2022-04-25 NOTE — Patient Instructions (Signed)
I have sent the diabetic medication called Rybelsus to your pharmacy. Please work on meal planning.

## 2022-04-25 NOTE — Progress Notes (Signed)
Patient ID: Tracey Gallegos, female    DOB: Aug 11, 1955  MRN: [id number]  CC: Chronic disease management   Subjective: Tracey Gallegos is a 66 y.o. female who presents for chronic disease management Her concerns today include:  Patient with history of HTN, DM type II, CAD with DES to LT CXM, Diastolic and systolic CHF with EF of 01-77%, moderate pulmonary hypertension on echo 04/2018, morbid obesity, silent lacunar infarcts on MRI 12/2021.  Headaches: Since last visit with me saw the neurologist Dr. Billey Gosling.  Diagnosed with tension type headaches.  HA resolved -CTA of head and neck revealed mild to moderate atherosclerosis of the distal right vertebral artery and mild atherosclerotic disease of carotid bifurcation.  Neurologist advised increase atorvastatin to 80 mg daily and continue aspirin. -X-ray of the neck revealed advanced degenerative disc disease.  Saw Dr. Lorin Mercy.  No myelopathy symptoms.  Recommended physical therapy.  Did several sessions; found it helpful but stopped going because she figured out what she has to do after several sessions.  HTN/CAD/combine CHF Entresto increased on last visit to 97/103 mg twice daily.  Continued with amlodipine 10 mg daily,  and metoprolol 50 mg daily.  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  BP good with cardiology and clinical pharmacist 3 days ago.  Took medications already for today. -Saw cardiology PA 3 days ago.  LDL at that time was 91 which was improved from 121 back in June.  She recommends adding Zetia. Pt does not want more meds.  Atorvastatin was increased to 80 mg in July -no CP/SOB.  Occasional LE edema  DM: Results for orders placed or performed in visit on 04/25/22  POCT glycosylated hemoglobin (Hb A1C)  Result Value Ref Range   Hemoglobin A1C     HbA1c POC (<> result, manual entry)     HbA1c, POC (prediabetic range)     HbA1c, POC (controlled diabetic range) 8.3  (A) 0.0 - 7.0 %  POCT glucose (manual entry)  Result Value Ref Range   POC Glucose 179 (A) 70 - 99 mg/dl  Compliant with glipizide 5 mg twice a day. Checks BS in mornings before BF.  Range 140-165. Eating more recently than before due to busy schedule.  Not taking the time to cook as much Eye exam scheduled for next wk with Dr. Katy Fitch.  .    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   Dyslipidemia, goal LDL below 70 12/09/2019   Papanicolaou smear of cervix with low risk human papillomavirus (HPV) DNA test positive 01/21/2019   Influenza vaccination declined 07/20/2018   Chronic combined systolic and diastolic CHF (congestive heart failure) ([place]) 07/20/2018   Positive depression screening 07/20/2018   CAD (coronary artery disease) 07/09/2018   Hypertensive heart disease with acute on chronic systolic congestive heart failure ([place]) 06/24/2018   Type 2 diabetes mellitus without complication, without long-term current use of insulin ([place]) 06/24/2018   Morbid obesity ([place])      Current Outpatient Medications on File Prior to Visit  Medication Sig Dispense Refill   Accu-Chek Softclix Lancets lancets Use to check blood sugar daily. E11.9 100 each 2   amLODipine (NORVASC) 10 MG tablet Take 1 tablet (10 mg total) by  mouth daily. 90 tablet 2   aspirin (ASPIRIN 81) 81 MG EC tablet Take 1 tablet (81 mg total) by mouth daily. 100 tablet 2   atorvastatin (LIPITOR) 80 MG tablet Take 1 tablet (80 mg total) by mouth daily. 30 tablet 6   Blood Glucose Monitoring Suppl (ACCU-CHEK GUIDE ME) w/Device KIT Use to check blood sugar daily. E11.9 1 kit 0   butalbital-acetaminophen-caffeine (FIORICET) 50-325-40 MG tablet Take 1-2 tablets by mouth 2 (two) times daily as needed for headache. 20 tablet 0   diclofenac Sodium (VOLTAREN) 1  % GEL Apply 2 g topically 4 (four) times daily. 100 g 1   furosemide (LASIX) 40 MG tablet Take 1 tablet (40 mg total) by mouth daily. 90 tablet 1   glipiZIDE (GLUCOTROL) 5 MG tablet Take 1 tablet (5 mg total) by mouth 2 (two) times daily before a meal. 180 tablet 1   glucose blood (ACCU-CHEK GUIDE) test strip USE TO CHECK BLOOD SUGAR DAILY. 100 strip 2   metoprolol succinate (TOPROL-XL) 50 MG 24 hr tablet Take 1 tablet (50 mg total) by mouth daily. 90 tablet 1   Multiple Vitamins-Minerals (MULTIVITAMIN WITH MINERALS) tablet Take 1 tablet by mouth daily.     Omega-3 Fatty Acids (FISH OIL PO) Take 1,000 mg by mouth daily.      potassium chloride (KLOR-CON) 10 MEQ tablet TAKE 1 TABLET TWICE DAILY 180 tablet 1   sacubitril-valsartan (ENTRESTO) 97-103 MG Take 1 tablet by mouth 2 (two) times daily. 60 tablet 6   No current facility-administered medications on file prior to visit.    Allergies  Allergen Reactions   Lisinopril Cough    Social History   Socioeconomic History   Marital status: Single    Spouse name: Not on file   Number of children: 0   Years of education: Not on file   Highest education level: Not on file  Occupational History   Occupation: retired  Tobacco Use   Smoking status: Never   Smokeless tobacco: Never  Vaping Use   Vaping Use: Never used  Substance and Sexual Activity   Alcohol use: Yes    Comment: occasionally   Drug use: Never   Sexual activity: Not Currently  Other Topics Concern   Not on file  Social History Narrative   Right handed   Caffeine Intake 1 cup of coffee occassionally   Social Determinants of Health   Financial Resource Strain: Low Risk  (03/23/2021)   Overall Financial Resource Strain (CARDIA)    Difficulty of Paying Living Expenses: Not hard at all  Food Insecurity: No Food Insecurity (03/23/2021)   Hunger Vital Sign    Worried About Running Out of Food in the Last Year: Never true    [place] in the Last Year: Never true   Transportation Needs: No Transportation Needs (03/23/2021)   PRAPARE - Hydrologist (Medical): No    Lack of Transportation (Non-Medical): No  Physical Activity: Sufficiently Active (03/23/2021)   Exercise Vital Sign    Days of Exercise per Week: 5 days    Minutes of Exercise per Session: 80 min  Stress: No Stress Concern Present (03/23/2021)   [place]    Feeling of Stress : Not at all  Social Connections: Moderately Isolated (03/23/2021)   Social Connection and Isolation Panel [NHANES]    Frequency of Communication with Friends and Family: Three times a week    Frequency  of Social Gatherings with Friends and Family: Never    Attends Religious Services: Never    Marine scientist or Organizations: Yes    Attends Archivist Meetings: Never    Marital Status: Never married  Intimate Partner Violence: Not At Risk (03/23/2021)   Humiliation, Afraid, Rape, and Kick questionnaire    Fear of Current or Ex-Partner: No    Emotionally Abused: No    Physically Abused: No    Sexually Abused: No    Family History  Problem Relation Age of Onset   Diabetes Mother    Colon cancer Brother     Past Surgical History:  Procedure Laterality Date   CARDIAC CATHETERIZATION  06/28/2018   CORONARY STENT INTERVENTION N/A 06/28/2018   Procedure: CORONARY STENT INTERVENTION;  Surgeon: Leonie Man, MD;  Location: [facility];  Service: Cardiovascular;  Laterality: N/A;   CORONARY STENT PLACEMENT  06/28/2018   RIGHT/LEFT HEART CATH AND CORONARY ANGIOGRAPHY N/A 06/28/2018   Procedure: RIGHT/LEFT HEART CATH AND CORONARY ANGIOGRAPHY;  Surgeon: Leonie Man, MD;  Location: [facility];  Service: Cardiovascular;  Laterality: N/A;   TOTAL KNEE ARTHROPLASTY Left 05/2016    ROS: Review of Systems Negative except as stated above  PHYSICAL EXAM: BP 139/82   Pulse 63   Ht _0   (1.727 m)   Wt 295 lb 12.8 oz (134.2 kg)   SpO2 95%   BMI 44.98 kg/m   Wt Readings from Last 3 Encounters:  04/25/22 295 lb 12.8 oz (134.2 kg)  04/22/22 297 lb 6.4 oz (134.9 kg)  02/19/22 299 lb (135.6 kg)  BP 140/80  Physical Exam  General appearance - alert, well appearing, and in no distress Mental status - normal mood, behavior, speech, dress, motor activity, and thought processes Neck - supple, no significant adenopathy Chest - clear to auscultation, no wheezes, rales or rhonchi, symmetric air entry Heart - normal rate, regular rhythm, normal S1, S2, no murmurs, rubs, clicks or gallops Extremities -1+ bilateral lower extremity edema on the lower third of both legs.     Latest Ref Rng & Units 04/22/2022   10:54 AM 12/20/2021    4:43 PM 01/17/2021    4:51 PM  CMP  Glucose 70 - 99 mg/dL  135  128   BUN 8 - 27 mg/dL  16  16   Creatinine 0.57 - 1.00 mg/dL  0.97  1.00   Sodium 134 - 144 mmol/L  144  143   Potassium 3.5 - 5.2 mmol/L  4.9  5.2   Chloride 96 - 106 mmol/L  103  103   CO2 20 - 29 mmol/L  24  27   Calcium 8.7 - 10.3 mg/dL  9.9  10.3   Total Protein 6.0 - 8.5 g/dL 6.9  7.6  7.3   Total Bilirubin 0.0 - 1.2 mg/dL 0.5  0.5  0.7   Alkaline Phos 44 - 121 IU/L 121  115  99   AST 0 - 40 IU/L _1 ALT 0 - 32 IU/L _2 Lipid Panel     Component Value Date/Time   CHOL 157 04/22/2022 1054   TRIG 99 04/22/2022 1054   HDL 48 04/22/2022 1054   CHOLHDL 3.3 04/22/2022 1054   CHOLHDL 5.5 04/27/2018 1513   VLDL 17 04/27/2018 1513   LDLCALC 91 04/22/2022 1054    CBC    Component Value Date/Time  WBC 5.4 12/20/2021 1643   WBC 5.4 06/30/2018 0401   RBC 5.25 12/20/2021 1643   RBC 5.42 (H) 06/30/2018 0401   HGB 14.8 12/20/2021 1643   HCT 44.5 12/20/2021 1643   PLT 238 12/20/2021 1643   MCV 85 12/20/2021 1643   MCH 28.2 12/20/2021 1643   MCH 24.7 (L) 06/30/2018 0401   MCHC 33.3 12/20/2021 1643   MCHC 30.3 06/30/2018 0401   RDW 14.0 12/20/2021 1643    LYMPHSABS 1.7 07/09/2018 0937   MONOABS 0.5 06/24/2018 1305   EOSABS 0.6 (H) 07/09/2018 0937   BASOSABS 0.1 07/09/2018 0937    ASSESSMENT AND PLAN: 1. Type 2 diabetes mellitus with morbid obesity ([place]) Not at goal. Discussed and encourage meal planning so that she is not having to eat out as much. Continue glipizide.  We discussed adding Rybelsus to help not only with better diabetes control but with weight loss.  I went over with her how the medication works and possible side effects including nausea, vomiting and pancreatitis.  Advised that if she develops any vomiting or pain in the upper abdomen she should stop the medication and call me as this can be indicative of pancreatitis.  She is willing to give the medicine a try. - POCT glycosylated hemoglobin (Hb A1C) - POCT glucose (manual entry) - Basic Metabolic Panel - Microalbumin / creatinine urine ratio - Semaglutide (RYBELSUS) 3 MG TABS; Take 3 mg by mouth daily.  Dispense: 30 tablet; Refill: 5  2. Hypertension associated with type 2 diabetes mellitus ([place]) Above goal today.  However I will have her continue medications as listed above as blood pressure was very good 3 days ago when she saw the cardiology NP and also our clinical pharmacist.  3. Hyperlipidemia due to type 2 diabetes mellitus ([place]) LDL improved but not at goal.  Most recent reading was 91.  She is on atorvastatin 80 mg.  Patient does not wish to add Zetia.  4. Coronary artery disease involving native coronary artery of native heart without angina pectoris Stable.  Continue atorvastatin, metoprolol and aspirin.  5. Influenza vaccination declined Recommended.  Patient declined.  6. Vertebral artery stenosis, asymptomatic, right Continue aspirin and statin therapy.    Patient was given the opportunity to ask questions.  Patient verbalized understanding of the plan and was able to repeat key elements of the plan.   This documentation was completed using English as a second language teacher.  Any transcriptional errors are unintentional.  Orders Placed This Encounter  Procedures   POCT glycosylated hemoglobin (Hb A1C)   POCT glucose (manual entry)     Requested Prescriptions    No prescriptions requested or ordered in this encounter    No follow-ups on file.  Karle Plumber, MD, FACP

## 2022-04-26 LAB — BASIC METABOLIC PANEL
BUN/Creatinine Ratio: 15 (ref 12–28)
BUN: 15 mg/dL (ref 8–27)
CO2: 25 mmol/L (ref 20–29)
Calcium: 10 mg/dL (ref 8.7–10.3)
Chloride: 103 mmol/L (ref 96–106)
Creatinine, Ser: 0.98 mg/dL (ref 0.57–1.00)
Glucose: 167 mg/dL — ABNORMAL HIGH (ref 70–99)
Potassium: 4.7 mmol/L (ref 3.5–5.2)
Sodium: 143 mmol/L (ref 134–144)
eGFR: 63 mL/min/{1.73_m2} (ref >59)

## 2022-04-26 LAB — MICROALBUMIN / CREATININE URINE RATIO
Creatinine, Urine: 369.6 mg/dL
Microalb/Creat Ratio: 5 mg/g creat (ref 0–29)
Microalbumin, Urine: 19.3 ug/mL

## 2022-04-29 ENCOUNTER — Ambulatory Visit: Payer: Medicare PPO | Admitting: Internal Medicine

## 2022-04-30 DIAGNOSIS — H25813 Combined forms of age-related cataract, bilateral: Secondary | ICD-10-CM | POA: Diagnosis not present

## 2022-04-30 DIAGNOSIS — E119 Type 2 diabetes mellitus without complications: Secondary | ICD-10-CM | POA: Diagnosis not present

## 2022-04-30 DIAGNOSIS — H35033 Hypertensive retinopathy, bilateral: Secondary | ICD-10-CM | POA: Diagnosis not present

## 2022-04-30 DIAGNOSIS — H527 Unspecified disorder of refraction: Secondary | ICD-10-CM | POA: Diagnosis not present

## 2022-04-30 DIAGNOSIS — H1045 Other chronic allergic conjunctivitis: Secondary | ICD-10-CM | POA: Diagnosis not present

## 2022-04-30 LAB — HM DIABETES EYE EXAM

## 2022-05-09 ENCOUNTER — Ambulatory Visit: Payer: Medicare PPO | Attending: Internal Medicine | Admitting: Internal Medicine

## 2022-05-09 DIAGNOSIS — Z0000 Encounter for general adult medical examination without abnormal findings: Secondary | ICD-10-CM | POA: Diagnosis not present

## 2022-05-09 NOTE — Patient Instructions (Signed)
Tracey Gallegos , Thank you for taking time to come for your Medicare Wellness Visit. I appreciate your ongoing commitment to your health goals. Please review the following plan we discussed and let me know if I can assist you in the future.   These are the goals we discussed:  Goals   None     This is a list of the screening recommended for you and due dates:  Health Maintenance  Topic Date Due   Medicare Annual Wellness Visit  Never done   Pap Smear  09/18/2021   COVID-19 Vaccine (3 - Pfizer series) 09/07/2022*   Flu Shot  10/19/2022*   Zoster (Shingles) Vaccine (1 of 2) 03/24/2023*   Complete foot exam   08/22/2022   Hemoglobin A1C  10/25/2022   Yearly kidney function blood test for diabetes  04/26/2023   Yearly kidney health urinalysis for diabetes  04/26/2023   Eye exam for diabetics  05/01/2023   Mammogram  05/14/2023   Pneumonia Vaccine (3 - PPSV23 or PCV20) 07/21/2023   Colon Cancer Screening  02/02/2024   Tetanus Vaccine  07/20/2028   DEXA scan (bone density measurement)  Completed   Hepatitis C Screening: USPSTF Recommendation to screen - Ages 57-79 yo.  Completed   HPV Vaccine  Aged Out  *Topic was postponed. The date shown is not the original due date.        Health Maintenance, Female Adopting a healthy lifestyle and getting preventive care are important in promoting health and wellness. Ask your health care provider about: The right schedule for you to have regular tests and exams. Things you can do on your own to prevent diseases and keep yourself healthy. What should I know about diet, weight, and exercise? Eat a healthy diet  Eat a diet that includes plenty of vegetables, fruits, low-fat dairy products, and lean protein. Do not eat a lot of foods that are high in solid fats, added sugars, or sodium. Maintain a healthy weight Body mass index (BMI) is used to identify weight problems. It estimates body fat based on height and weight. Your health care provider  can help determine your BMI and help you achieve or maintain a healthy weight. Get regular exercise Get regular exercise. This is one of the most important things you can do for your health. Most adults should: Exercise for at least 150 minutes each week. The exercise should increase your heart rate and make you sweat (moderate-intensity exercise). Do strengthening exercises at least twice a week. This is in addition to the moderate-intensity exercise. Spend less time sitting. Even light physical activity can be beneficial. Watch cholesterol and blood lipids Have your blood tested for lipids and cholesterol at 67 years of age, then have this test every 5 years. Have your cholesterol levels checked more often if: Your lipid or cholesterol levels are high. You are older than 67 years of age. You are at high risk for heart disease. What should I know about cancer screening? Depending on your health history and family history, you may need to have cancer screening at various ages. This may include screening for: Breast cancer. Cervical cancer. Colorectal cancer. Skin cancer. Lung cancer. What should I know about heart disease, diabetes, and high blood pressure? Blood pressure and heart disease High blood pressure causes heart disease and increases the risk of stroke. This is more likely to develop in people who have high blood pressure readings or are overweight. Have your blood pressure checked: Every 3-5 years if  you are 17-59 years of age. Every year if you are 46 years old or older. Diabetes Have regular diabetes screenings. This checks your fasting blood sugar level. Have the screening done: Once every three years after age 16 if you are at a normal weight and have a low risk for diabetes. More often and at a younger age if you are overweight or have a high risk for diabetes. What should I know about preventing infection? Hepatitis B If you have a higher risk for hepatitis B, you  should be screened for this virus. Talk with your health care provider to find out if you are at risk for hepatitis B infection. Hepatitis C Testing is recommended for: Everyone born from [birth year] through 1966. Anyone with known risk factors for hepatitis C. Sexually transmitted infections (STIs) Get screened for STIs, including gonorrhea and chlamydia, if: You are sexually active and are younger than 67 years of age. You are older than 67 years of age and your health care provider tells you that you are at risk for this type of infection. Your sexual activity has changed since you were last screened, and you are at increased risk for chlamydia or gonorrhea. Ask your health care provider if you are at risk. Ask your health care provider about whether you are at high risk for HIV. Your health care provider may recommend a prescription medicine to help prevent HIV infection. If you choose to take medicine to prevent HIV, you should first get tested for HIV. You should then be tested every 3 months for as long as you are taking the medicine. Pregnancy If you are about to stop having your period (premenopausal) and you may become pregnant, seek counseling before you get pregnant. Take 400 to 800 micrograms (mcg) of folic acid every day if you become pregnant. Ask for birth control (contraception) if you want to prevent pregnancy. Osteoporosis and menopause Osteoporosis is a disease in which the bones lose minerals and strength with aging. This can result in bone fractures. If you are 77 years old or older, or if you are at risk for osteoporosis and fractures, ask your health care provider if you should: Be screened for bone loss. Take a calcium or vitamin D supplement to lower your risk of fractures. Be given hormone replacement therapy (HRT) to treat symptoms of menopause. Follow these instructions at home: Alcohol use Do not drink alcohol if: Your health care provider tells you not to drink. You  are pregnant, may be pregnant, or are planning to become pregnant. If you drink alcohol: Limit how much you have to: 0-1 drink a day. Know how much alcohol is in your drink. In the [place] one drink equals one 12 oz bottle of beer (355 mL), one 5 oz glass of wine (148 mL), or one 1 oz glass of hard liquor (44 mL). Lifestyle Do not use any products that contain nicotine or tobacco. These products include cigarettes, chewing tobacco, and vaping devices, such as e-cigarettes. If you need help quitting, ask your health care provider. Do not use street drugs. Do not share needles. Ask your health care provider for help if you need support or information about quitting drugs. General instructions Schedule regular health, dental, and eye exams. Stay current with your vaccines. Tell your health care provider if: You often feel depressed. You have ever been abused or do not feel safe at home. Summary Adopting a healthy lifestyle and getting preventive care are important in promoting health and wellness.  Follow your health care provider's instructions about healthy diet, exercising, and getting tested or screened for diseases. Follow your health care provider's instructions on monitoring your cholesterol and blood pressure. This information is not intended to replace advice given to you by your health care provider. Make sure you discuss any questions you have with your health care provider. Document Revised: 11/26/2020 Document Reviewed: 11/26/2020 Elsevier Patient Education  2023 [employer].

## 2022-05-09 NOTE — Progress Notes (Signed)
Subjective:   Tracey Gallegos is a 66 y.o. female who presents for Medicare Annual (Subsequent) preventive examination.  I connected with  [name] on 05/13/22 by a audio enabled telemedicine application and verified that I am speaking with the correct person using two identifiers.  Patient Location: Home  Provider Location: Home Office  I discussed the limitations of evaluation and management by telemedicine. The patient expressed understanding and agreed to proceed.  Nutrition Risk Assessment:  Has the patient had any N/V/D within the last 2 months?  Yes , one day Does the patient have any non-healing wounds?  No  Has the patient had any unintentional weight loss or weight gain?  No   Diabetes:  Is the patient diabetic?  yes If diabetic, was a CBG obtained today?  No. But was obtained yesterday. Reading was 140. Did the patient bring in their glucometer from home?  No  How often do you monitor your CBG's? daily. daily  Financial Strains and Diabetes Management:  Are you having any financial strains with the device, your supplies or your medication? No .  Does the patient want to be seen by Chronic Care Management for management of their diabetes?  No  Would the patient like to be referred to a Nutritionist or for Diabetic Management?  No   Diabetic Exams:  Diabetic Eye Exam: Completed 04/30/2022  Dr. Katy Fitch Diabetic Foot Exam: Completed 08/22/2021        Objective:    There were no vitals filed for this visit. There is no height or weight on file to calculate BMI.     05/09/2022    4:40 PM 05/09/2022    1:09 PM 02/04/2022    1:21 PM 03/23/2021   11:13 AM 06/09/2020    8:20 PM 06/24/2018   11:03 PM 06/24/2018   11:44 AM  Advanced Directives  Does Patient Have a Medical Advance Directive? No No No No No  No  Would patient like information on creating a medical advance directive? No - Patient declined No - Patient declined No - Patient declined No - Patient declined   No - Patient declined     Current Medications (verified) Outpatient Encounter Medications as of 05/09/2022  Medication Sig   Accu-Chek Softclix Lancets lancets Use to check blood sugar daily. E11.9   amLODipine (NORVASC) 10 MG tablet Take 1 tablet (10 mg total) by mouth daily.   aspirin (ASPIRIN 81) 81 MG EC tablet Take 1 tablet (81 mg total) by mouth daily.   atorvastatin (LIPITOR) 80 MG tablet Take 1 tablet (80 mg total) by mouth daily.   Blood Glucose Monitoring Suppl (ACCU-CHEK GUIDE ME) w/Device KIT Use to check blood sugar daily. E11.9   butalbital-acetaminophen-caffeine (FIORICET) 50-325-40 MG tablet Take 1-2 tablets by mouth 2 (two) times daily as needed for headache.   diclofenac Sodium (VOLTAREN) 1 % GEL Apply 2 g topically 4 (four) times daily.   furosemide (LASIX) 40 MG tablet Take 1 tablet (40 mg total) by mouth daily.   glipiZIDE (GLUCOTROL) 5 MG tablet Take 1 tablet (5 mg total) by mouth 2 (two) times daily before a meal.   glucose blood (ACCU-CHEK GUIDE) test strip USE TO CHECK BLOOD SUGAR DAILY.   metoprolol succinate (TOPROL-XL) 50 MG 24 hr tablet Take 1 tablet (50 mg total) by mouth daily.   Multiple Vitamins-Minerals (MULTIVITAMIN WITH MINERALS) tablet Take 1 tablet by mouth daily.   Omega-3 Fatty Acids (FISH OIL PO) Take 1,000 mg by mouth daily.  potassium chloride (KLOR-CON) 10 MEQ tablet TAKE 1 TABLET TWICE DAILY   sacubitril-valsartan (ENTRESTO) 97-103 MG Take 1 tablet by mouth 2 (two) times daily.   Semaglutide (RYBELSUS) 3 MG TABS Take 3 mg by mouth daily.   No facility-administered encounter medications on file as of 05/09/2022.    Allergies (verified) Lisinopril   History: Past Medical History:  Diagnosis Date   CHF (congestive heart failure) (HCC)    Coronary artery disease    Diabetes mellitus without complication ([place])    Dyspnea    Hypertension    Past Surgical History:  Procedure Laterality Date   CARDIAC CATHETERIZATION  06/28/2018    CORONARY STENT INTERVENTION N/A 06/28/2018   Procedure: CORONARY STENT INTERVENTION;  Surgeon: Leonie Man, MD;  Location: [facility];  Service: Cardiovascular;  Laterality: N/A;   CORONARY STENT PLACEMENT  06/28/2018   RIGHT/LEFT HEART CATH AND CORONARY ANGIOGRAPHY N/A 06/28/2018   Procedure: RIGHT/LEFT HEART CATH AND CORONARY ANGIOGRAPHY;  Surgeon: Leonie Man, MD;  Location: [facility];  Service: Cardiovascular;  Laterality: N/A;   TOTAL KNEE ARTHROPLASTY Left 05/2016   Family History  Problem Relation Age of Onset   Diabetes Mother    Colon cancer Brother    Social History   Socioeconomic History   Marital status: Single    Spouse name: Not on file   Number of children: 0   Years of education: Not on file   Highest education level: Not on file  Occupational History   Occupation: retired  Tobacco Use   Smoking status: Never   Smokeless tobacco: Never  Vaping Use   Vaping Use: Never used  Substance and Sexual Activity   Alcohol use: Yes    Comment: occasionally   Drug use: Never   Sexual activity: Not Currently  Other Topics Concern   Not on file  Social History Narrative   Right handed   Caffeine Intake 1 cup of coffee occassionally   Social Determinants of Health   Financial Resource Strain: Low Risk  (05/09/2022)   Overall Financial Resource Strain (CARDIA)    Difficulty of Paying Living Expenses: Not very hard  Food Insecurity: No Food Insecurity (05/09/2022)   Hunger Vital Sign    Worried About Running Out of Food in the Last Year: Never true    [place] in the Last Year: Never true  Transportation Needs: Unknown (05/09/2022)   PRAPARE - Hydrologist (Medical): Not on file    Lack of Transportation (Non-Medical): No  Physical Activity: Insufficiently Active (05/09/2022)   Exercise Vital Sign    Days of Exercise per Week: 1 day    Minutes of Exercise per Session: 20 min  Stress: No Stress Concern  Present (05/09/2022)   [place]    Feeling of Stress : Not at all  Social Connections: Moderately Integrated (05/09/2022)   Social Connection and Isolation Panel [NHANES]    Frequency of Communication with Friends and Family: More than three times a week    Frequency of Social Gatherings with Friends and Family: Once a week    Attends Religious Services: 1 to 4 times per year    Active Member of [employer] or Organizations: No    Attends Archivist Meetings: 1 to 4 times per year    Marital Status: Never married    Tobacco Counseling Counseling given: Not Answered   Clinical Intake:  Diabetic? Yes         Activities of Daily Living    05/09/2022   12:30 PM  In your present state of health, do you have any difficulty performing the following activities:  Hearing? 0  Vision? 0  Difficulty concentrating or making decisions? 0  Walking or climbing stairs? 0  Dressing or bathing? 0  Doing errands, shopping? 0  Preparing Food and eating ? N  Using the Toilet? N  In the past six months, have you accidently leaked urine? N  Do you have problems with loss of bowel control? N  Managing your Medications? N  Managing your Finances? N  Housekeeping or managing your Housekeeping? N    Patient Care Team: Ladell Pier, MD as PCP - General (Internal Medicine) Debara Pickett Nadean Corwin, MD as PCP - Cardiology (Cardiology)  Indicate any recent Medical Services you may have received from other than [facility] providers in the past year (date may be approximate).     Assessment:   This is a routine wellness examination for [facility].  Hearing/Vision screen No results found.  Dietary issues and exercise activities discussed:     Goals Addressed   None   Depression Screen    05/09/2022   12:15 PM 01/16/2022    2:51 PM 12/20/2021    3:43 PM 08/22/2021    2:40 PM 03/23/2021   11:05 AM 01/17/2021     4:06 PM 10/19/2020   10:18 AM  PHQ 2/9 Scores  PHQ - 2 Score 0 0 0 0 0 0 0  PHQ- 9 Score  0 1    0    Fall Risk    05/09/2022   12:15 PM 04/25/2022    1:42 PM 01/16/2022    2:51 PM 12/20/2021    3:44 PM 08/22/2021    2:40 PM  [place] in the past year? 0 0 0 0 0  Number falls in past yr: 0 0 0 0 0  Injury with Fall? 0 0 0 0 0  Risk for fall due to : No Fall Risks No Fall Risks  No Fall Risks No Fall Risks  Follow up Falls evaluation completed Falls evaluation completed  Falls evaluation completed     [place]:  Any stairs in or around the home? No  If so, are there any without handrails? No  Home free of loose throw rugs in walkways, pet beds, electrical cords, etc? Yes  Adequate lighting in your home to reduce risk of falls? Yes   ASSISTIVE DEVICES UTILIZED TO PREVENT FALLS:  Life alert? No  Use of a cane, walker or w/c? No  Grab bars in the bathroom? No  Shower chair or bench in shower? No  Elevated toilet seat or a handicapped toilet? No     Cognitive Function:        05/09/2022    1:11 PM 03/23/2021   11:17 AM  6CIT Screen  What Year? 0 points 0 points  What month? 0 points 0 points  What time? 0 points 0 points  Count back from 20 0 points 0 points  Months in reverse 0 points 0 points  Repeat phrase 0 points 0 points  Total Score 0 points 0 points    Immunizations Immunization History  Administered Date(s) Administered   PFIZER(Purple Top)SARS-COV-2 Vaccination 10/14/2019, 11/09/2019   Pneumococcal Conjugate-13 01/31/2020   Pneumococcal Polysaccharide-23 07/20/2018   Tdap 07/20/2018    TDAP status: Up  to date  Flu Vaccine status: Declined, Education has been provided regarding the importance of this vaccine but patient still declined. Advised may receive this vaccine at local pharmacy or Health Dept. Aware to provide a copy of the vaccination record if obtained from local pharmacy or Health Dept. Verbalized  acceptance and understanding.  Declines flu vaccines annually.   Pneumococcal vaccine status: Up to date  Covid-19 vaccine status: Completed vaccines  Qualifies for Shingles Vaccine? Yes   Zostavax completed No   Shingrix Completed?: No.    Education has been provided regarding the importance of this vaccine. Patient has been advised to call insurance company to determine out of pocket expense if they have not yet received this vaccine. Advised may also receive vaccine at local pharmacy or Health Dept. Verbalized acceptance and understanding.  Screening Tests Health Maintenance  Topic Date Due   PAP SMEAR-Modifier  09/18/2021   COVID-19 Vaccine (3 - Pfizer series) 09/07/2022 (Originally 01/04/2020)   INFLUENZA VACCINE  10/19/2022 (Originally 02/18/2022)   Zoster Vaccines- Shingrix (1 of 2) 03/24/2023 (Originally 12/21/2004)   FOOT EXAM  08/22/2022   HEMOGLOBIN A1C  10/25/2022   Diabetic kidney evaluation - GFR measurement  04/26/2023   Diabetic kidney evaluation - Urine ACR  04/26/2023   OPHTHALMOLOGY EXAM  05/01/2023   MAMMOGRAM  05/14/2023   Pneumonia Vaccine 65+ Years old (3 - PPSV23 or PCV20) 07/21/2023   COLONOSCOPY (Pts 45-44yrs Insurance coverage will need to be confirmed)  02/02/2024   TETANUS/TDAP  07/20/2028   DEXA SCAN  Completed   Hepatitis C Screening  Completed   HPV VACCINES  Aged Out    Health Maintenance  Health Maintenance Due  Topic Date Due   PAP SMEAR-Modifier  09/18/2021    Colorectal cancer screening: Type of screening: Colonoscopy. Completed 02/01/2014. Repeat every 10 years  Mammogram status: Completed 05/13/2021. Repeat every year  Bone Density status: Completed 10/23/2020. Results reflect: Bone density results: NORMAL. Repeat every   years.  Lung Cancer Screening: (Low Dose CT Chest recommended if Age 81-80 years, 30 pack-year currently smoking OR have quit w/in 15years.) does not qualify.   Lung Cancer Screening Referral: n/a   Additional  Screening:  Hepatitis C Screening: does qualify; Completed 01/17/2019  Vision Screening: Recommended annual ophthalmology exams for early detection of glaucoma and other disorders of the eye. Is the patient up to date with their annual eye exam?  Yes  Who is the provider or what is the name of the office in which the patient attends annual eye exams?  If pt is not established with a provider, would they like to be referred to a provider to establish care? No .   Dental Screening: Recommended annual dental exams for proper oral hygiene. Last dental appointment was in March 2023   Community Resource Referral / Chronic Care Management: CRR required this visit?  No   CCM required this visit?  No      Plan:     I have personally reviewed and noted the following in the patient's chart:   Medical and social history Use of alcohol, tobacco or illicit drugs  Current medications and supplements including opioid prescriptions. Patient is not currently taking opioid prescriptions. Functional ability and status Nutritional status Physical activity Advanced directives List of other physicians Hospitalizations, surgeries, and ER visits in previous 12 months Vitals Screenings to include cognitive, depression, and falls Referrals and appointments  In addition, I have reviewed and discussed with patient certain preventive protocols, quality metrics,  and best practice recommendations. A written personalized care plan for preventive services as well as general preventive health recommendations were provided to patient.     Carilyn Goodpasture, RN   05/13/2022   Nurse Notes: Non-Face-to-Face 15 minutes

## 2022-05-14 ENCOUNTER — Other Ambulatory Visit: Payer: Self-pay

## 2022-05-22 ENCOUNTER — Other Ambulatory Visit: Payer: Self-pay

## 2022-05-23 ENCOUNTER — Other Ambulatory Visit: Payer: Self-pay | Admitting: Internal Medicine

## 2022-05-23 ENCOUNTER — Other Ambulatory Visit: Payer: Self-pay

## 2022-05-23 DIAGNOSIS — I10 Essential (primary) hypertension: Secondary | ICD-10-CM

## 2022-05-23 MED ORDER — AMLODIPINE BESYLATE 10 MG PO TABS
10.0000 mg | ORAL_TABLET | Freq: Every day | ORAL | 1 refills | Status: DC
  Filled 2022-05-23: qty 90, 90d supply, fill #0
  Filled 2022-08-29 – 2022-09-05 (×2): qty 90, 90d supply, fill #1

## 2022-05-23 NOTE — Telephone Encounter (Signed)
Requested Prescriptions  Pending Prescriptions Disp Refills   amLODipine (NORVASC) 10 MG tablet 90 tablet 1    Sig: Take 1 tablet (10 mg total) by mouth daily.     Cardiovascular: Calcium Channel Blockers 2 Passed - 05/23/2022 12:48 PM      Passed - Last BP in normal range    BP Readings from Last 1 Encounters:  04/25/22 139/82         Passed - Last Heart Rate in normal range    Pulse Readings from Last 1 Encounters:  04/25/22 63         Passed - Valid encounter within last 6 months    Recent Outpatient Visits           2 weeks ago Encounter for [employer] annual wellness exam   [place], Neoma Laming B, MD   4 weeks ago Type 2 diabetes mellitus with morbid obesity [facility])   [place], MD   1 month ago Essential hypertension   [place], Jarome Matin, RPH-CPP   2 months ago Essential hypertension   [place], Jarome Matin, RPH-CPP   3 months ago Essential hypertension   [place], RPH-CPP       Future Appointments             In 3 months Wynetta Emery, Dalbert Batman, MD [place]

## 2022-06-13 ENCOUNTER — Other Ambulatory Visit: Payer: Self-pay

## 2022-06-20 ENCOUNTER — Other Ambulatory Visit: Payer: Self-pay

## 2022-06-30 ENCOUNTER — Other Ambulatory Visit: Payer: Self-pay

## 2022-07-01 ENCOUNTER — Other Ambulatory Visit: Payer: Self-pay

## 2022-07-07 ENCOUNTER — Other Ambulatory Visit: Payer: Self-pay

## 2022-07-08 ENCOUNTER — Other Ambulatory Visit: Payer: Self-pay

## 2022-07-16 ENCOUNTER — Other Ambulatory Visit: Payer: Self-pay

## 2022-07-17 ENCOUNTER — Other Ambulatory Visit: Payer: Self-pay | Admitting: Internal Medicine

## 2022-07-17 DIAGNOSIS — I5042 Chronic combined systolic (congestive) and diastolic (congestive) heart failure: Secondary | ICD-10-CM

## 2022-08-01 ENCOUNTER — Other Ambulatory Visit: Payer: Self-pay

## 2022-08-12 ENCOUNTER — Ambulatory Visit: Payer: Medicare PPO | Admitting: Psychiatry

## 2022-08-15 ENCOUNTER — Other Ambulatory Visit: Payer: Self-pay

## 2022-08-26 ENCOUNTER — Ambulatory Visit: Payer: Medicare PPO | Admitting: Internal Medicine

## 2022-08-29 ENCOUNTER — Other Ambulatory Visit: Payer: Self-pay | Admitting: Internal Medicine

## 2022-08-29 ENCOUNTER — Other Ambulatory Visit: Payer: Self-pay

## 2022-08-29 DIAGNOSIS — I10 Essential (primary) hypertension: Secondary | ICD-10-CM

## 2022-08-29 MED ORDER — METOPROLOL SUCCINATE ER 50 MG PO TB24
50.0000 mg | ORAL_TABLET | Freq: Every day | ORAL | 0 refills | Status: DC
  Filled 2022-08-29 – 2022-09-05 (×2): qty 90, 90d supply, fill #0

## 2022-08-29 NOTE — Telephone Encounter (Signed)
Requested Prescriptions  Pending Prescriptions Disp Refills   metoprolol succinate (TOPROL-XL) 50 MG 24 hr tablet 90 tablet 0    Sig: Take 1 tablet (50 mg total) by mouth daily.     Cardiovascular:  Beta Blockers Passed - 08/29/2022 11:16 AM      Passed - Last BP in normal range    BP Readings from Last 1 Encounters:  04/25/22 139/82         Passed - Last Heart Rate in normal range    Pulse Readings from Last 1 Encounters:  04/25/22 63         Passed - Valid encounter within last 6 months    Recent Outpatient Visits           3 months ago Encounter for [employer] annual wellness exam   [place] Karle Plumber B, MD   4 months ago Type 2 diabetes mellitus with morbid obesity [facility])   [place], MD   4 months ago Essential hypertension   [place], Jarome Matin, RPH-CPP   5 months ago Essential hypertension   [place], RPH-CPP   6 months ago Essential hypertension   [place], RPH-CPP       Future Appointments             In 3 weeks Wynetta Emery, Dalbert Batman, MD [place]

## 2022-09-04 ENCOUNTER — Other Ambulatory Visit: Payer: Self-pay

## 2022-09-05 ENCOUNTER — Other Ambulatory Visit: Payer: Self-pay

## 2022-09-19 ENCOUNTER — Ambulatory Visit: Payer: Medicare HMO | Attending: Family Medicine | Admitting: Internal Medicine

## 2022-09-19 ENCOUNTER — Encounter: Payer: Self-pay | Admitting: Internal Medicine

## 2022-09-19 ENCOUNTER — Other Ambulatory Visit: Payer: Self-pay

## 2022-09-19 DIAGNOSIS — E1159 Type 2 diabetes mellitus with other circulatory complications: Secondary | ICD-10-CM | POA: Diagnosis not present

## 2022-09-19 DIAGNOSIS — E1169 Type 2 diabetes mellitus with other specified complication: Secondary | ICD-10-CM

## 2022-09-19 DIAGNOSIS — B977 Papillomavirus as the cause of diseases classified elsewhere: Secondary | ICD-10-CM | POA: Diagnosis not present

## 2022-09-19 DIAGNOSIS — I2510 Atherosclerotic heart disease of native coronary artery without angina pectoris: Secondary | ICD-10-CM | POA: Diagnosis not present

## 2022-09-19 DIAGNOSIS — I152 Hypertension secondary to endocrine disorders: Secondary | ICD-10-CM | POA: Diagnosis not present

## 2022-09-19 DIAGNOSIS — I5042 Chronic combined systolic (congestive) and diastolic (congestive) heart failure: Secondary | ICD-10-CM

## 2022-09-19 LAB — GLUCOSE, POCT (MANUAL RESULT ENTRY): POC Glucose: 186 mg/dl — AB (ref 70–99)

## 2022-09-19 LAB — POCT GLYCOSYLATED HEMOGLOBIN (HGB A1C): HbA1c, POC (controlled diabetic range): 8.2 % — AB (ref 0.0–7.0)

## 2022-09-19 MED ORDER — RYBELSUS 3 MG PO TABS
3.0000 mg | ORAL_TABLET | Freq: Every day | ORAL | 5 refills | Status: DC
  Filled 2022-09-19: qty 30, 30d supply, fill #0
  Filled 2022-10-13 – 2022-10-20 (×2): qty 30, 30d supply, fill #1
  Filled 2022-11-14: qty 30, 30d supply, fill #2
  Filled 2022-12-15: qty 30, 30d supply, fill #3
  Filled 2023-01-09: qty 30, 30d supply, fill #4

## 2022-09-19 MED ORDER — METOPROLOL SUCCINATE ER 50 MG PO TB24
50.0000 mg | ORAL_TABLET | Freq: Every day | ORAL | 1 refills | Status: DC
  Filled 2022-09-19 – 2022-12-01 (×2): qty 90, 90d supply, fill #0
  Filled 2023-03-13 (×2): qty 90, 90d supply, fill #1

## 2022-09-19 MED ORDER — AMLODIPINE BESYLATE 10 MG PO TABS
10.0000 mg | ORAL_TABLET | Freq: Every day | ORAL | 1 refills | Status: DC
  Filled 2022-09-19 – 2022-12-01 (×2): qty 90, 90d supply, fill #0
  Filled 2023-03-13 (×2): qty 90, 90d supply, fill #1

## 2022-09-19 MED ORDER — GLIPIZIDE 5 MG PO TABS
5.0000 mg | ORAL_TABLET | Freq: Two times a day (BID) | ORAL | 1 refills | Status: DC
  Filled 2022-09-19: qty 180, 90d supply, fill #0
  Filled 2023-01-08: qty 180, 90d supply, fill #1

## 2022-09-19 NOTE — Patient Instructions (Signed)
Please make sure when you go downstairs to the pharmacy today he is specifically ask for the Rybelsus which we will prescribe for the diabetes and to help with weight loss.  If you develop any vomiting, pain in the upper abdomen or significant diarrhea on Rybelsus, please stop the medication and let me know.

## 2022-09-19 NOTE — Progress Notes (Signed)
Patient ID: Tracey Gallegos, female    DOB: Oct 30, 1955  MRN: [id number]  CC: Diabetes (DM f/u. Med refill. Orion Crook semaflutide - pt is unfamiliar with it)   Subjective: Tracey Gallegos is a 67 y.o. female who presents for chronic ds management Her concerns today include:  Patient with history of HTN, DM type II, CAD with DES to LT CXM, Diastolic and systolic CHF with EF of 99991111, moderate pulmonary hypertension on echo 04/2018, morbid obesity, silent lacunar infarcts on MRI 12/2021, cervical DDD, tension headache  DM Results for orders placed or performed in visit on 09/19/22  POCT glucose (manual entry)  Result Value Ref Range   POC Glucose 186 (A) 70 - 99 mg/dl  POCT glycosylated hemoglobin (Hb A1C)  Result Value Ref Range   Hemoglobin A1C     HbA1c POC (<> result, manual entry)     HbA1c, POC (prediabetic range)     HbA1c, POC (controlled diabetic range) 8.2 (A) 0.0 - 7.0 %  On last visit, we discussed adding Rybelsus to help not only with better diabetes control but with weight loss.  She was to continue the glipizide.  Never filled the Rybelsus, said pharmacy never said anything about it Checks BS in a.m before BF 140-180 Reports eating habits have not been good. Works 6-9 p.m.  Once she gets home she wants to snack on chips, [employer].  Also had company.  HTN/CAD/combined CHF: Reports compliance with Entresto 97/103 mg twice a day, amlodipine 10 mg daily, metoprolol XL 50 mg daily, Lipitor 80 mg daily, Zetia 10 mg daily, ASA and furosemide 4 times a week Does not check BP -no CP/SOB.  LE edema which goes and comes. Limits salt  Due for repeat PAP even though she is over age 1.  Last PAP was 09/2020 which was HPV positive but subtype negative. Saw Dr. Rip Harbour who recommended repeat PAP/HPV in 1 yr.  Patient Active Problem List   Diagnosis Date Noted   Spondylosis without myelopathy or radiculopathy, cervical region 02/19/2022   Postmenopausal bleeding 10/19/2020   HPV in  female 10/19/2020   Dyslipidemia, goal LDL below 70 12/09/2019   Papanicolaou smear of cervix with low risk human papillomavirus (HPV) DNA test positive 01/21/2019   Influenza vaccination declined 07/20/2018   Chronic combined systolic and diastolic CHF (congestive heart failure) ([place]) 07/20/2018   Positive depression screening 07/20/2018   CAD (coronary artery disease) 07/09/2018   Type 2 diabetes mellitus without complication, without long-term current use of insulin ([place]) 06/24/2018   Morbid obesity ([place])      Current Outpatient Medications on File Prior to Visit  Medication Sig Dispense Refill   Accu-Chek Softclix Lancets lancets Use to check blood sugar daily. E11.9 100 each 2   amLODipine (NORVASC) 10 MG tablet Take 1 tablet (10 mg total) by mouth daily. 90 tablet 1   aspirin (ASPIRIN 81) 81 MG EC tablet Take 1 tablet (81 mg total) by mouth daily. 100 tablet 2   atorvastatin (LIPITOR) 80 MG tablet Take 1 tablet (80 mg total) by mouth daily. 30 tablet 6   Blood Glucose Monitoring Suppl (ACCU-CHEK GUIDE ME) w/Device KIT Use to check blood sugar daily. E11.9 1 kit 0   butalbital-acetaminophen-caffeine (FIORICET) 50-325-40 MG tablet Take 1-2 tablets by mouth 2 (two) times daily as needed for headache. 20 tablet 0   diclofenac Sodium (VOLTAREN) 1 % GEL Apply 2 g topically 4 (four) times daily. 100 g 1   furosemide (LASIX) 40 MG  tablet Take 1 tablet (40 mg total) by mouth daily. 90 tablet 1   glipiZIDE (GLUCOTROL) 5 MG tablet Take 1 tablet (5 mg total) by mouth 2 (two) times daily before a meal. 180 tablet 1   glucose blood (ACCU-CHEK GUIDE) test strip USE TO CHECK BLOOD SUGAR DAILY. 100 strip 2   metoprolol succinate (TOPROL-XL) 50 MG 24 hr tablet Take 1 tablet (50 mg total) by mouth daily. 90 tablet 0   Multiple Vitamins-Minerals (MULTIVITAMIN WITH MINERALS) tablet Take 1 tablet by mouth daily.     Omega-3 Fatty Acids (FISH OIL PO) Take 1,000 mg by mouth daily.      potassium chloride  (KLOR-CON) 10 MEQ tablet TAKE 1 TABLET TWICE DAILY 180 tablet 1   sacubitril-valsartan (ENTRESTO) 97-103 MG Take 1 tablet by mouth 2 (two) times daily. 60 tablet 6   Semaglutide (RYBELSUS) 3 MG TABS Take 3 mg by mouth daily. (Patient not taking: Reported on 09/19/2022) 30 tablet 5   No current facility-administered medications on file prior to visit.    Allergies  Allergen Reactions   Lisinopril Cough    Social History   Socioeconomic History   Marital status: Single    Spouse name: Not on file   Number of children: 0   Years of education: Not on file   Highest education level: Not on file  Occupational History   Occupation: retired  Tobacco Use   Smoking status: Never   Smokeless tobacco: Never  Vaping Use   Vaping Use: Never used  Substance and Sexual Activity   Alcohol use: Yes    Comment: occasionally   Drug use: Never   Sexual activity: Not Currently  Other Topics Concern   Not on file  Social History Narrative   Right handed   Caffeine Intake 1 cup of coffee occassionally   Social Determinants of Health   Financial Resource Strain: Low Risk  (05/09/2022)   Overall Financial Resource Strain (CARDIA)    Difficulty of Paying Living Expenses: Not very hard  Food Insecurity: No Food Insecurity (05/09/2022)   Hunger Vital Sign    Worried About Running Out of Food in the Last Year: Never true    Baring in the Last Year: Never true  Transportation Needs: Unknown (05/09/2022)   PRAPARE - Hydrologist (Medical): Not on file    Lack of Transportation (Non-Medical): No  Physical Activity: Insufficiently Active (05/09/2022)   Exercise Vital Sign    Days of Exercise per Week: 1 day    Minutes of Exercise per Session: 20 min  Stress: No Stress Concern Present (05/09/2022)   [place]    Feeling of Stress : Not at all  Social Connections: Moderately Integrated  (05/09/2022)   Social Connection and Isolation Panel [NHANES]    Frequency of Communication with Friends and Family: More than three times a week    Frequency of Social Gatherings with Friends and Family: Once a week    Attends Religious Services: 1 to 4 times per year    Active Member of [employer] or Organizations: No    Attends Archivist Meetings: 1 to 4 times per year    Marital Status: Never married  Intimate Partner Violence: Not At Risk (05/09/2022)   Humiliation, Afraid, Rape, and Kick questionnaire    Fear of Current or Ex-Partner: No    Emotionally Abused: No    Physically Abused: No  Sexually Abused: No    Family History  Problem Relation Age of Onset   Diabetes Mother    Colon cancer Brother     Past Surgical History:  Procedure Laterality Date   CARDIAC CATHETERIZATION  06/28/2018   CORONARY STENT INTERVENTION N/A 06/28/2018   Procedure: CORONARY STENT INTERVENTION;  Surgeon: Leonie Man, MD;  Location: [facility];  Service: Cardiovascular;  Laterality: N/A;   CORONARY STENT PLACEMENT  06/28/2018   RIGHT/LEFT HEART CATH AND CORONARY ANGIOGRAPHY N/A 06/28/2018   Procedure: RIGHT/LEFT HEART CATH AND CORONARY ANGIOGRAPHY;  Surgeon: Leonie Man, MD;  Location: [facility];  Service: Cardiovascular;  Laterality: N/A;   TOTAL KNEE ARTHROPLASTY Left 05/2016    ROS: Review of Systems Negative except as stated above  PHYSICAL EXAM: BP 132/73 (BP Location: Left Arm, Patient Position: Sitting, Cuff Size: Large)   Pulse 62   Temp 98 F (36.7 C) (Oral)   Ht '5\' 8"'$  (1.727 m)   Wt 290 lb (131.5 kg)   SpO2 99%   BMI 44.09 kg/m   Wt Readings from Last 3 Encounters:  09/19/22 290 lb (131.5 kg)  04/25/22 295 lb 12.8 oz (134.2 kg)  04/22/22 297 lb 6.4 oz (134.9 kg)    Physical Exam  General appearance - alert, well appearing, older AAF and in no distress Mental status - alert, oriented to person, place, and time, normal mood, behavior,  speech, dress, motor activity, and thought processes Neck - supple, no significant adenopathy Chest - clear to auscultation, no wheezes, rales or rhonchi, symmetric air entry Heart - normal rate, regular rhythm, normal S1, S2, no murmurs, rubs, clicks or gallops Extremities - trace LE edema      Latest Ref Rng & Units 04/25/2022    2:26 PM 04/22/2022   10:54 AM 12/20/2021    4:43 PM  CMP  Glucose 70 - 99 mg/dL 167   135   BUN 8 - 27 mg/dL 15   16   Creatinine 0.57 - 1.00 mg/dL 0.98   0.97   Sodium 134 - 144 mmol/L 143   144   Potassium 3.5 - 5.2 mmol/L 4.7   4.9   Chloride 96 - 106 mmol/L 103   103   CO2 20 - 29 mmol/L 25   24   Calcium 8.7 - 10.3 mg/dL 10.0   9.9   Total Protein 6.0 - 8.5 g/dL  6.9  7.6   Total Bilirubin 0.0 - 1.2 mg/dL  0.5  0.5   Alkaline Phos 44 - 121 IU/L  121  115   AST 0 - 40 IU/L  25  26   ALT 0 - 32 IU/L  30  18    Lipid Panel     Component Value Date/Time   CHOL 157 04/22/2022 1054   TRIG 99 04/22/2022 1054   HDL 48 04/22/2022 1054   CHOLHDL 3.3 04/22/2022 1054   CHOLHDL 5.5 04/27/2018 1513   VLDL 17 04/27/2018 1513   LDLCALC 91 04/22/2022 1054    CBC    Component Value Date/Time   WBC 5.4 12/20/2021 1643   WBC 5.4 06/30/2018 0401   RBC 5.25 12/20/2021 1643   RBC 5.42 (H) 06/30/2018 0401   HGB 14.8 12/20/2021 1643   HCT 44.5 12/20/2021 1643   PLT 238 12/20/2021 1643   MCV 85 12/20/2021 1643   MCH 28.2 12/20/2021 1643   MCH 24.7 (L) 06/30/2018 0401   MCHC 33.3 12/20/2021 1643  MCHC 30.3 06/30/2018 0401   RDW 14.0 12/20/2021 1643   LYMPHSABS 1.7 07/09/2018 0937   MONOABS 0.5 06/24/2018 1305   EOSABS 0.6 (H) 07/09/2018 0937   BASOSABS 0.1 07/09/2018 0937    ASSESSMENT AND PLAN:  1. Type 2 diabetes mellitus with morbid obesity ([place]) Not at goal Dietary counseling given Continue Glipizide.  Still willing to try Rybelsus.  I went over with her how the medication works and possible side effects including nausea, vomiting, diarrhea,  bowel obstruction and pancreatitis. Advised that if she develops any vomiting or pain in the upper abdomen she should stop the medication and call me as this can be indicative of pancreatitis.  - POCT glucose (manual entry) - POCT glycosylated hemoglobin (Hb A1C) - glipiZIDE (GLUCOTROL) 5 MG tablet; Take 1 tablet (5 mg total) by mouth 2 (two) times daily before a meal.  Dispense: 180 tablet; Refill: 1 - Semaglutide (RYBELSUS) 3 MG TABS; Take 1 tablet (3 mg total) by mouth daily.  Dispense: 30 tablet; Refill: 5  2. Chronic combined systolic and diastolic CHF (congestive heart failure) ([facility]) Stable and compensated. Continue Entresto, metoprolol and furosemide dosages mentioned above.  3. Hypertension associated with type 2 diabetes mellitus ([place]) Close to goal.  Continue current medications listed above - amLODipine (NORVASC) 10 MG tablet; Take 1 tablet (10 mg total) by mouth daily.  Dispense: 90 tablet; Refill: 1 - metoprolol succinate (TOPROL-XL) 50 MG 24 hr tablet; Take 1 tablet (50 mg total) by mouth daily.  Dispense: 90 tablet; Refill: 1  4. Coronary artery disease involving native coronary artery of native heart without angina pectoris Stable.  Continue Lipitor, metoprolol and aspirin - metoprolol succinate (TOPROL-XL) 50 MG 24 hr tablet; Take 1 tablet (50 mg total) by mouth daily.  Dispense: 90 tablet; Refill: 1  5. HPV in female Will schedule patient for repeat Pap smear in 6 weeks.  Will send message to Dr. Danne Baxter to inquire whether it is still necessary to do so given pt's age.     Patient was given the opportunity to ask questions.  Patient verbalized understanding of the plan and was able to repeat key elements of the plan.   This documentation was completed using Radio producer.  Any transcriptional errors are unintentional.  Orders Placed This Encounter  Procedures   POCT glucose (manual entry)   POCT glycosylated hemoglobin (Hb A1C)     Requested  Prescriptions    No prescriptions requested or ordered in this encounter    No follow-ups on file.  Karle Plumber, MD, FACP

## 2022-09-24 ENCOUNTER — Other Ambulatory Visit: Payer: Self-pay

## 2022-09-24 ENCOUNTER — Telehealth: Payer: Self-pay | Admitting: Internal Medicine

## 2022-09-24 NOTE — Telephone Encounter (Signed)
Called & spoke to the patient. Verified name & DOB. Informed that pap is no longer needed per gynecologist. Patient expressed verbal understanding. Follow up appointment for chronic disease management scheduled for 01/26/2023.

## 2022-09-24 NOTE — Telephone Encounter (Signed)
-----   Message from Chancy Milroy, MD sent at 09/22/2022  8:47 AM EST ----- I am assuming that the cytology was negative. If that is the case, I would stop with pap smears since she is past 64. Hope that is helpful. Have a great day. Legrand Como   ----- Message ----- From: Ladell Pier, MD Sent: 09/19/2022   5:23 PM EST To: Chancy Milroy, MD  Good afternoon. I have a question for you regarding this pt. She had pap in 2020 and 2022 both of which were +HPV but negative subtypes.  You saw her in 2022 for this and recommended repeat cytology with HPV in 1 yr.  We did not get in done in 2023.  Pt is now 70.  Should I still move forward in doing a repeat pap and HPV?

## 2022-10-13 ENCOUNTER — Other Ambulatory Visit: Payer: Self-pay | Admitting: Psychiatry

## 2022-10-14 ENCOUNTER — Other Ambulatory Visit: Payer: Self-pay

## 2022-10-14 MED ORDER — ATORVASTATIN CALCIUM 80 MG PO TABS
80.0000 mg | ORAL_TABLET | Freq: Every day | ORAL | 0 refills | Status: DC
  Filled 2022-10-14 – 2022-10-20 (×2): qty 30, 30d supply, fill #0

## 2022-10-14 NOTE — Telephone Encounter (Signed)
Last seen on 01/23/22 No 6 month follow up scheduled  Per 02/25/22 Ct scan results "I recommend increasing your cholesterol medication Lipitor (atorvastatin) to 80 mg daily. "

## 2022-10-20 ENCOUNTER — Other Ambulatory Visit: Payer: Self-pay

## 2022-11-10 ENCOUNTER — Ambulatory Visit: Payer: Medicare HMO | Admitting: Internal Medicine

## 2022-11-14 ENCOUNTER — Other Ambulatory Visit: Payer: Self-pay

## 2022-11-26 NOTE — Progress Notes (Signed)
Cardiology Clinic Note   Patient Name: Tracey Gallegos Date of Encounter: 11/28/2022  Primary Care Provider:  Marcine Matar, MD Primary Cardiologist:  Chrystie Nose, MD  Patient Profile    Tracey Gallegos 67 year old female presents the clinic today for follow-up evaluation of her coronary artery disease and hypertension.  Past Medical History    Past Medical History:  Diagnosis Date   CHF (congestive heart failure) (HCC)    Coronary artery disease    Diabetes mellitus without complication (HCC)    Dyspnea    Hypertension    Past Surgical History:  Procedure Laterality Date   CARDIAC CATHETERIZATION  06/28/2018   CORONARY STENT INTERVENTION N/A 06/28/2018   Procedure: CORONARY STENT INTERVENTION;  Surgeon: Marykay Lex, MD;  Location: [facility] INVASIVE CV LAB;  Service: Cardiovascular;  Laterality: N/A;   CORONARY STENT PLACEMENT  06/28/2018   RIGHT/LEFT HEART CATH AND CORONARY ANGIOGRAPHY N/A 06/28/2018   Procedure: RIGHT/LEFT HEART CATH AND CORONARY ANGIOGRAPHY;  Surgeon: Marykay Lex, MD;  Location: [facility] INVASIVE CV LAB;  Service: Cardiovascular;  Laterality: N/A;   TOTAL KNEE ARTHROPLASTY Left 05/2016    Allergies  Allergies  Allergen Reactions   Lisinopril Cough    History of Present Illness    Tracey Gallegos has a PMH of hypertension, CAD, chronic combined systolic and diastolic CHF, type 2 diabetes, obesity, depression, hyperlipidemia, and postmenopausal bleeding.  She was seen in follow-up by Dr. Rennis Golden on 09/05/2021.  She presented for follow-up evaluation of her CHF.  She reported that she had been out of her Entresto for around 1 month.  She was noticing increasing lower extremity edema and weight gain.  She was felt to be euvolemic and her primary care provider's office.  She reported that she did not get short of breath.  She did previously note short of breath with low EF.  Her EF had normalized on her echocardiogram in 2020.  She reported compliance with  amlodipine, metoprolol and furosemide.  There was a cost issue with her Entresto.  She had been initially getting free with paperwork.  She had reapplied for assistance but had not yet heard a reply.  It was felt that her symptoms of CHF were stable.  Her blood pressure was elevated.  She had previously been on 49/51 twice daily of Entresto.  She was given samples.  Follow-up was planned for 6 months.  She presented to the clinic  04/22/22 for follow-up evaluation and stated she was tolerating her medications well.  She reported slight lower extremity swelling.  She had been trying to get to the [facility] 2-3 times per week.  She enjoyed walking on the track and using a stationary bike.  She did have some dietary indiscretion related to sodium when she eats out.  Follow-up in 6 months was planned.    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  She has not been as physically active due to her work schedule.  We reviewed her October lipid panel and LDL.  She expressed understanding.  I recommended that she start ezetimibe.  We used shared decision making and she wishes to defer adding ezetimibe at this time.  She will continue to work on her diet and exercise.  Her EKG today shows normal sinus rhythm incomplete left bundle branch block 70 bpm.  Her blood pressure is well-controlled.  We will plan follow-up in 6-9  months and repeat fasting lipids and LFTs in October.  Today she denies chest pain, shortness of breath, fatigue, palpitations, melena, hematuria, hemoptysis, diaphoresis, weakness, presyncope, syncope, orthopnea, and PND.    Home Medications    Prior to Admission medications   Medication Sig Start Date End Date Taking? Authorizing Provider  furosemide (LASIX) 40 MG tablet Take 1 tablet (40 mg total) by mouth daily. 04/02/22   Marcine Matar, MD  glipiZIDE (GLUCOTROL) 5 MG tablet Take 1 tablet (5 mg  total) by mouth 2 (two) times daily before a meal. 04/02/22   Marcine Matar, MD  Accu-Chek Softclix Lancets lancets Use to check blood sugar daily. E11.9 03/20/20   Marcine Matar, MD  amLODipine (NORVASC) 10 MG tablet Take 1 tablet (10 mg total) by mouth daily. 08/22/21   Marcine Matar, MD  aspirin (ASPIRIN 81) 81 MG EC tablet Take 1 tablet (81 mg total) by mouth daily. 01/31/20   Marcine Matar, MD  atorvastatin (LIPITOR) 80 MG tablet Take 1 tablet (80 mg total) by mouth daily. 02/26/22   Ocie Doyne, MD  Blood Glucose Monitoring Suppl (ACCU-CHEK GUIDE ME) w/Device KIT Use to check blood sugar daily. E11.9 03/20/20   Marcine Matar, MD  butalbital-acetaminophen-caffeine (FIORICET) [id number] MG tablet Take 1-2 tablets by mouth 2 (two) times daily as needed for headache. 12/20/21   Marcine Matar, MD  diclofenac Sodium (VOLTAREN) 1 % GEL Apply 2 g topically 4 (four) times daily. 12/20/21   Marcine Matar, MD  glucose blood (ACCU-CHEK GUIDE) test strip USE TO CHECK BLOOD SUGAR DAILY. 02/24/22 02/21/23  Marcine Matar, MD  metoprolol succinate (TOPROL-XL) 50 MG 24 hr tablet Take 1 tablet (50 mg total) by mouth daily. 01/16/22   Marcine Matar, MD  Multiple Vitamins-Minerals (MULTIVITAMIN WITH MINERALS) tablet Take 1 tablet by mouth daily.    [provider]  Omega-3 Fatty Acids (FISH OIL PO) Take 1,000 mg by mouth daily.     [provider]  potassium chloride (KLOR-CON) 10 MEQ tablet TAKE 1 TABLET TWICE DAILY 10/01/21   Marcine Matar, MD  PRENAT-FECBN-FEBISG-FA-FISHOIL PO Take by mouth. Patient not taking: Reported on 01/16/2022    [provider]  sacubitril-valsartan (ENTRESTO) 97-103 MG Take 1 tablet by mouth 2 (two) times daily. 02/11/22   Marcine Matar, MD    Family History    Family History  Problem Relation Age of Onset   Diabetes Mother    Colon cancer Brother    She indicated that her mother is alive. She indicated that her  father is deceased. She indicated that her brother is deceased.  Social History    Social History   Socioeconomic History   Marital status: Single    Spouse name: Not on file   Number of children: 0   Years of education: Not on file   Highest education level: Not on file  Occupational History   Occupation: retired  Tobacco Use   Smoking status: Never   Smokeless tobacco: Never  Vaping Use   Vaping Use: Never used  Substance and Sexual Activity   Alcohol use: Yes    Comment: occasionally   Drug use: Never   Sexual activity: Not Currently  Other Topics Concern   Not on file  Social History Narrative   Right handed   Caffeine Intake 1 cup of coffee occassionally   Social Determinants of Health   Financial Resource Strain: Low Risk  (05/09/2022)  Overall Financial Resource Strain (CARDIA)    Difficulty of Paying Living Expenses: Not very hard  Food Insecurity: No Food Insecurity (05/09/2022)   Hunger Vital Sign    Worried About Running Out of Food in the Last Year: Never true    Ran Out of Food in the Last Year: Never true  Transportation Needs: Unknown (05/09/2022)   PRAPARE - Administrator, Civil Service (Medical): Not on file    Lack of Transportation (Non-Medical): No  Physical Activity: Insufficiently Active (05/09/2022)   Exercise Vital Sign    Days of Exercise per Week: 1 day    Minutes of Exercise per Session: 20 min  Stress: No Stress Concern Present (05/09/2022)   [employer] of Occupational Health - Occupational Stress Questionnaire    Feeling of Stress : Not at all  Social Connections: Moderately Integrated (05/09/2022)   Social Connection and Isolation Panel [NHANES]    Frequency of Communication with Friends and Family: More than three times a week    Frequency of Social Gatherings with Friends and Family: Once a week    Attends Religious Services: 1 to 4 times per year    Active Member of [employer] or Organizations: No    Attends Occupational hygienist Meetings: 1 to 4 times per year    Marital Status: Never married  Intimate Partner Violence: Not At Risk (05/09/2022)   Humiliation, Afraid, Rape, and Kick questionnaire    Fear of Current or Ex-Partner: No    Emotionally Abused: No    Physically Abused: No    Sexually Abused: No     Review of Systems    General:  No chills, fever, night sweats or weight changes.  Cardiovascular:  No chest pain, dyspnea on exertion, edema, orthopnea, palpitations, paroxysmal nocturnal dyspnea. Dermatological: No rash, lesions/masses Respiratory: No cough, dyspnea Urologic: No hematuria, dysuria Abdominal:   No nausea, vomiting, diarrhea, bright red blood per rectum, melena, or hematemesis Neurologic:  No visual changes, wkns, changes in mental status. All other systems reviewed and are otherwise negative except as noted above.  Physical Exam    VS:  BP 134/72 (BP Location: Left Arm, Patient Position: Sitting, Cuff Size: Large)   Ht 5\' 8"  (1.727 m)   Wt 291 lb 9.6 oz (132.3 kg)   SpO2 97%   BMI 44.34 kg/m  , BMI Body mass index is 44.34 kg/m. GEN: Well nourished, well developed, in no acute distress. HEENT: normal. Neck: Supple, no JVD, carotid bruits, or masses. Cardiac: RRR, no murmurs, rubs, or gallops. No clubbing, cyanosis, edema.  Radials/DP/PT 2+ and equal bilaterally.  Respiratory:  Respirations regular and unlabored, clear to auscultation bilaterally. GI: Soft, nontender, nondistended, BS + x 4. MS: no deformity or atrophy. Skin: warm and dry, no rash. Neuro:  Strength and sensation are intact. Psych: Normal affect.  Accessory Clinical Findings    Recent Labs: 12/20/2021: Hemoglobin 14.8; Platelets 238 04/22/2022: ALT 30 04/25/2022: BUN 15; Creatinine, Ser 0.98; Potassium 4.7; Sodium 143   Recent Lipid Panel    Component Value Date/Time   CHOL 157 04/22/2022 1054   TRIG 99 04/22/2022 1054   HDL 48 04/22/2022 1054   CHOLHDL 3.3 04/22/2022 1054   CHOLHDL 5.5  04/27/2018 1513   VLDL 17 04/27/2018 1513   LDLCALC 91 04/22/2022 1054         ECG personally reviewed by me today-normal sinus rhythm low voltage QRS, incomplete left bundle branch block 70 bpm  EKG 04/22/2022 Normal sinus  rhythm 67 bpm- No acute changes  Echocardiogram 05/18/2019 IMPRESSIONS     1. EF now normal ~55% compared with 12/29/2018.   2. Left ventricular ejection fraction, by visual estimation, is 55 to  60%. The left ventricle has normal function. There is mildly increased  left ventricular hypertrophy.   3. LVEF by 3D 55%.   4. Global right ventricle has normal systolic function.The right  ventricular size is normal. No increase in right ventricular wall  thickness.   5. Right atrial size was normal.   6. The pericardial effusion is circumferential.   7. Trivial pericardial effusion is present.   8. The tricuspid valve is grossly normal. Tricuspid valve regurgitation  is trivial.   9. Mild plaque invoving the ascending aorta.  10. Left atrial size was normal.  11. The aortic valve was not well visualized. Aortic valve regurgitation  is not visualized.  12. The mitral valve is normal in structure. No evidence of mitral valve  regurgitation. No evidence of mitral stenosis.  13. Mild mitral annular calcification.  14. The pulmonic valve was not well visualized. Pulmonic valve  regurgitation is not visualized.  Cardiac catheterization 06/28/2018 Prox Cx to Mid Cx lesion is 90% stenosed with 90% stenosed side branch in Ost 1st Mrg. A drug-eluting stent was successfully placed in the Ost 1st Mrg using a STENT SIERRA 2.25 X 23 MM (postdilated to 2.4 mm). Post intervention, there is a 0% residual stenosis. A drug-eluting stent was successfully placed in the p-mCx using a STENT SIERRA 2.50 X 28 MM (postdilated to 2.9 mm). Post intervention, the side branch was reduced to 0% residual stenosis. Ost Ramus to Ramus lesion is 55% stenosed. LV end diastolic pressure is  severely elevated (acute on chronic diastolic heart failure). With known severely reduced EF by Echo (acute on chronic systolic heart failure) Hemodynamic findings consistent with moderate pulmonary hypertension. -LVEDP of 29 mmHg with wedge pressure of 242-19mmHg -> consistent with acute on chronic combined systolic and diastolic heart failure   SUMMARY:  Severe DILATED CARDIOMYOPATHY per echocardiogram with evidence of ACUTE ON CHRONIC COMBINED SYSTOLIC AND DIASTOLIC HEART FAILURE: Elevated LVEDP of 29 million mercury with a wedge pressure of 24-26 mmHg. 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 Moderate 55% stenosis in prox RI. Moderate Secondary Pulmonary HTN    RECOMMENDATIONS: Transfer to 6 Central post procedure unit for post PCI care.  Was given additional 40 mg IV Lasix +10 mg IV hydralazine upon arrival to the floor for systemic hypertension and worsening dyspnea. Continue IV diuresis given severely elevated LVEDP -> we will give her a.m. dose upon arrival back to the floor.  (Suspect that she will need several more days of IV diuresis) Continue aggressive CHF management with hypertension management and rate control. Continue statin -target LDL <50   Would continue dual antiplatelet therapy aspirin and Brilinta for at least 6 months.  Would be okay to stop aspirin after 3 months if necessary,.  Okay to hold Brilinta at 6 months for procedures, but would preferably.  Continue on for a minimum 1 year at treatment dose and at reduced dose for an additional year based on bifurcation stenting.   Anticipate that she will require diuresis for several days prior to discharge.  Bryan Lemma, M.D., M.S. Interventional Cardiologist   Pager # [phone] Phone #  [phone] [address] [place], [place] [address]  Diagnostic Dominance: Right  Intervention      Assessment & Plan   1.  Chronic combined systolic and diastolic CHF-  Weight stable.  Continues to be euvolemic.  Tolerating her medications well and denies side effects.  Continue Entresto, metoprolol, furosemide Heart healthy low-sodium diet Maintain physical activity  Coronary artery disease-  Denies recent episodes of arm neck back or chest discomfort.  Cardiac catheterization with PCI and DES to her first marginal and circumflex vessels 12/19. Continue current medical therapy  Hyperlipidemia-04/22/2022: Cholesterol, Total 157; HDL 48; LDL Chol Calc (NIH) 91; Triglycerides 99 Continue aspirin, atorvastatin, omega-3 fatty acids Heart healthy  high-fiber diet Increase physical activity as tolerated  Recommended adding ezetimibe-wishes to defer- work on diet and exercise, fiber supplement Repeat lipids and LFTs 10/24   Essential hypertension-BP today 134/72. Continue amlodipine, Entresto, metoprolol, furosemide Heart healthy low-sodium diet-salty 6 given Increase physical activity as tolerated   Disposition: Follow-up with Dr. Rennis Golden or me in 6-9 months.   Thomasene Ripple. Lissa Rowles NP-C     11/28/2022, 9:30 AM [facility] [address] Office [phone] Fax [phone]  Notice: This dictation was prepared with Dragon dictation along with smaller phrase technology. Any transcriptional errors that result from this process are unintentional and may not be corrected upon review.  I spent 14 minutes examining this patient, reviewing medications, and using patient centered shared decision making involving her cardiac care.  Prior to her visit I spent greater than 20 minutes reviewing her past medical history,  medications, and prior cardiac tests.

## 2022-11-27 ENCOUNTER — Ambulatory Visit: Payer: Medicare HMO | Admitting: General Practice

## 2022-11-28 ENCOUNTER — Ambulatory Visit: Payer: Medicare HMO | Attending: General Practice | Admitting: General Practice

## 2022-11-28 ENCOUNTER — Encounter: Payer: Self-pay | Admitting: General Practice

## 2022-11-28 ENCOUNTER — Ambulatory Visit: Payer: Medicare PPO | Admitting: Internal Medicine

## 2022-11-28 VITALS — BP 134/72 | Ht 68.0 in | Wt 291.6 lb

## 2022-11-28 DIAGNOSIS — E785 Hyperlipidemia, unspecified: Secondary | ICD-10-CM | POA: Diagnosis not present

## 2022-11-28 DIAGNOSIS — I2510 Atherosclerotic heart disease of native coronary artery without angina pectoris: Secondary | ICD-10-CM

## 2022-11-28 DIAGNOSIS — I10 Essential (primary) hypertension: Secondary | ICD-10-CM

## 2022-11-28 DIAGNOSIS — I5042 Chronic combined systolic (congestive) and diastolic (congestive) heart failure: Secondary | ICD-10-CM

## 2022-11-28 NOTE — Patient Instructions (Signed)
Medication Instructions:  The current medical regimen is effective;  continue present plan and medications as directed. Please refer to the Current Medication list given to you today.  *If you need a refill on your cardiac medications before your next appointment, please call your pharmacy*  Lab Work: FASTING LIPID AND LFT IN OCTOBER If you have labs (blood work) drawn today and your tests are completely normal, you will receive your results only by:  MyChart Message (if you have MyChart) OR  A paper copy in the mail If you have any lab test that is abnormal or we need to change your treatment, we will call you to review the results.  Testing/Procedures: NONE  Follow-Up: At [facility], you and your health needs are our priority.  As part of our continuing mission to provide you with exceptional heart care, we have created designated Provider Care Teams.  These Care Teams include your primary Cardiologist (physician) and Advanced Practice Providers (APPs -  Physician Assistants and Nurse Practitioners) who all work together to provide you with the care you need, when you need it.  Your next appointment:   6-9 month(s)  Provider:   Chrystie Nose, MD     Other Instructions  High-Fiber Eating Plan Fiber, also called dietary fiber, is a type of carbohydrate. It is found foods such as fruits, vegetables, whole grains, and beans. A high-fiber diet can have many health benefits. Your health care provider may recommend a high-fiber diet to help: Prevent constipation. Fiber can make your bowel movements more regular. Lower your cholesterol. Relieve the following conditions: Inflammation of veins in the anus (hemorrhoids). Inflammation of specific areas of the digestive tract (uncomplicated diverticulosis). A problem of the large intestine, also called the colon, that sometimes causes pain and diarrhea (irritable bowel syndrome, or IBS). Prevent overeating as part of a weight-loss  plan. Prevent heart disease, type 2 diabetes, and certain cancers. What are tips for following this plan? Reading food labels  Check the nutrition facts label on food products for the amount of dietary fiber. Choose foods that have 5 grams of fiber or more per serving. The goals for recommended daily fiber intake include: Men (age 76 or younger): 34-38 g. Men (over age 52): 28-34 g. Women (age 97 or younger): 25-28 g. Women (over age 18): 22-25 g. Your daily fiber goal is _____________ g. Shopping Choose whole fruits and vegetables instead of processed forms, such as apple juice or applesauce. Choose a wide variety of high-fiber foods such as avocados, lentils, oats, and kidney beans. Read the nutrition facts label of the foods you choose. Be aware of foods with added fiber. These foods often have high sugar and sodium amounts per serving. Cooking Use whole-grain flour for baking and cooking. Cook with brown rice instead of white rice. Meal planning Start the day with a breakfast that is high in fiber, such as a cereal that contains 5 g of fiber or more per serving. Eat breads and cereals that are made with whole-grain flour instead of refined flour or white flour. Eat brown rice, bulgur wheat, or millet instead of white rice. Use beans in place of meat in soups, salads, and pasta dishes. Be sure that half of the grains you eat each day are whole grains. General information You can get the recommended daily intake of dietary fiber by: Eating a variety of fruits, vegetables, grains, nuts, and beans. Taking a fiber supplement if you are not able to take in enough  fiber in your diet. It is better to get fiber through food than from a supplement. Gradually increase how much fiber you consume. If you increase your intake of dietary fiber too quickly, you may have bloating, cramping, or gas. Drink plenty of water to help you digest fiber. Choose high-fiber snacks, such as berries, raw  vegetables, nuts, and popcorn. What foods should I eat? Fruits Berries. Pears. Apples. Oranges. Avocado. Prunes and raisins. Dried figs. Vegetables Sweet potatoes. Spinach. Kale. Artichokes. Cabbage. Broccoli. Cauliflower. Green peas. Carrots. Squash. Grains Whole-grain breads. Multigrain cereal. Oats and oatmeal. Brown rice. Barley. Bulgur wheat. Millet. Quinoa. Bran muffins. Popcorn. Rye wafer crackers. Meats and other proteins Navy beans, kidney beans, and pinto beans. Soybeans. Split peas. Lentils. Nuts and seeds. Dairy Fiber-fortified yogurt. Beverages Fiber-fortified soy milk. Fiber-fortified orange juice. Other foods Fiber bars. The items listed above may not be a complete list of recommended foods and beverages. Contact a dietitian for more information. What foods should I avoid? Fruits Fruit juice. Cooked, strained fruit. Vegetables Fried potatoes. Canned vegetables. Well-cooked vegetables. Grains White bread. Pasta made with refined flour. White rice. Meats and other proteins Fatty cuts of meat. Fried chicken or fried fish. Dairy Milk. Yogurt. Cream cheese. Sour cream. Fats and oils Butters. Beverages Soft drinks. Other foods Cakes and pastries. The items listed above may not be a complete list of foods and beverages to avoid. Talk with your dietitian about what choices are best for you. Summary Fiber is a type of carbohydrate. It is found in foods such as fruits, vegetables, whole grains, and beans. A high-fiber diet has many benefits. It can help to prevent constipation, lower blood cholesterol, aid weight loss, and reduce your risk of heart disease, diabetes, and certain cancers. Increase your intake of fiber gradually. Increasing fiber too quickly may cause cramping, bloating, and gas. Drink plenty of water while you increase the amount of fiber you consume. The best sources of fiber include whole fruits and vegetables, whole grains, nuts, seeds, and beans. This  information is not intended to replace advice given to you by your health care provider. Make sure you discuss any questions you have with your health care provider. Document Revised: 11/10/2019 Document Reviewed: 11/10/2019 Elsevier Patient Education  2023 [employer].

## 2022-12-01 ENCOUNTER — Other Ambulatory Visit: Payer: Self-pay | Admitting: Psychiatry

## 2022-12-01 ENCOUNTER — Other Ambulatory Visit: Payer: Self-pay

## 2022-12-01 MED ORDER — ATORVASTATIN CALCIUM 80 MG PO TABS
80.0000 mg | ORAL_TABLET | Freq: Every day | ORAL | 0 refills | Status: DC
  Filled 2022-12-01: qty 30, 30d supply, fill #0

## 2022-12-03 ENCOUNTER — Other Ambulatory Visit: Payer: Self-pay

## 2022-12-15 ENCOUNTER — Other Ambulatory Visit: Payer: Self-pay

## 2022-12-16 ENCOUNTER — Other Ambulatory Visit: Payer: Self-pay

## 2023-01-08 ENCOUNTER — Other Ambulatory Visit: Payer: Self-pay | Admitting: Psychiatry

## 2023-01-10 ENCOUNTER — Other Ambulatory Visit: Payer: Self-pay | Admitting: Internal Medicine

## 2023-01-10 DIAGNOSIS — I5042 Chronic combined systolic (congestive) and diastolic (congestive) heart failure: Secondary | ICD-10-CM

## 2023-01-12 ENCOUNTER — Other Ambulatory Visit: Payer: Self-pay

## 2023-01-12 MED ORDER — ATORVASTATIN CALCIUM 80 MG PO TABS
80.0000 mg | ORAL_TABLET | Freq: Every day | ORAL | 0 refills | Status: DC
  Filled 2023-01-12: qty 30, 30d supply, fill #0

## 2023-01-12 NOTE — Telephone Encounter (Signed)
Requested Prescriptions  Pending Prescriptions Disp Refills   potassium chloride (KLOR-CON) 10 MEQ tablet [Pharmacy Med Name: POTASSIUM CHLORIDE ER 10 MEQ Tablet Extended Release] 180 tablet 0    Sig: TAKE 1 TABLET TWICE DAILY     Endocrinology:  Minerals - Potassium Supplementation Passed - 01/10/2023 12:52 AM      Passed - K in normal range and within 360 days    Potassium  Date Value Ref Range Status  04/25/2022 4.7 3.5 - 5.2 mmol/L Final         Passed - Cr in normal range and within 360 days    Creatinine, Ser  Date Value Ref Range Status  04/25/2022 0.98 0.57 - 1.00 mg/dL Final         Passed - Valid encounter within last 12 months    Recent Outpatient Visits           3 months ago Type 2 diabetes mellitus with morbid obesity (HCC)   [place] [facility] Marcine Matar, MD   8 months ago Encounter for [employer] annual wellness exam   [place] [facility] Jonah Blue B, MD   8 months ago Type 2 diabetes mellitus with morbid [facility]   [place] [facility] Marcine Matar, MD   8 months ago Essential hypertension   [facility] [place], Cornelius Moras, RPH-CPP   9 months ago Essential hypertension   [facility] [place], Cornelius Moras, RPH-CPP       Future Appointments             In 2 weeks Marcine Matar, MD [place] [facility]

## 2023-01-26 ENCOUNTER — Ambulatory Visit: Payer: Medicare HMO | Attending: Internal Medicine | Admitting: Internal Medicine

## 2023-01-26 ENCOUNTER — Encounter: Payer: Self-pay | Admitting: Internal Medicine

## 2023-01-26 ENCOUNTER — Other Ambulatory Visit: Payer: Self-pay

## 2023-01-26 DIAGNOSIS — E1159 Type 2 diabetes mellitus with other circulatory complications: Secondary | ICD-10-CM

## 2023-01-26 DIAGNOSIS — E1169 Type 2 diabetes mellitus with other specified complication: Secondary | ICD-10-CM

## 2023-01-26 DIAGNOSIS — Z6841 Body Mass Index (BMI) 40.0 and over, adult: Secondary | ICD-10-CM | POA: Diagnosis not present

## 2023-01-26 DIAGNOSIS — I5042 Chronic combined systolic (congestive) and diastolic (congestive) heart failure: Secondary | ICD-10-CM

## 2023-01-26 DIAGNOSIS — Z7984 Long term (current) use of oral hypoglycemic drugs: Secondary | ICD-10-CM

## 2023-01-26 DIAGNOSIS — I152 Hypertension secondary to endocrine disorders: Secondary | ICD-10-CM | POA: Diagnosis not present

## 2023-01-26 DIAGNOSIS — M1711 Unilateral primary osteoarthritis, right knee: Secondary | ICD-10-CM

## 2023-01-26 DIAGNOSIS — I2510 Atherosclerotic heart disease of native coronary artery without angina pectoris: Secondary | ICD-10-CM

## 2023-01-26 DIAGNOSIS — Z9181 History of falling: Secondary | ICD-10-CM

## 2023-01-26 LAB — GLUCOSE, POCT (MANUAL RESULT ENTRY): POC Glucose: 171 mg/dl — AB (ref 70–99)

## 2023-01-26 LAB — POCT GLYCOSYLATED HEMOGLOBIN (HGB A1C): HbA1c, POC (controlled diabetic range): 7 % (ref 0.0–7.0)

## 2023-01-26 MED ORDER — RYBELSUS 7 MG PO TABS
7.0000 mg | ORAL_TABLET | Freq: Every day | ORAL | 6 refills | Status: DC
  Filled 2023-01-26 – 2023-03-19 (×3): qty 30, 30d supply, fill #0
  Filled 2023-04-17: qty 120, 120d supply, fill #0
  Filled 2023-09-07: qty 30, 30d supply, fill #1
  Filled 2023-11-03 (×2): qty 30, 30d supply, fill #2
  Filled 2023-11-30: qty 30, 30d supply, fill #3

## 2023-01-26 NOTE — Progress Notes (Signed)
Patient ID: Tracey Gallegos, female    DOB: 09-27-1955  MRN: [id number]  CC: Diabetes (DM f/u. Nicki Reaper 2 falls in the past month - injured mouth on first fall and injured L arm & R leg on second fall.)   Subjective: Tracey Gallegos is a 67 y.o. 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  DM: Results for orders placed or performed in visit on 01/26/23  POCT glucose (manual entry)  Result Value Ref Range   POC Glucose 171 (A) 70 - 99 mg/dl  POCT glycosylated hemoglobin (Hb A1C)  Result Value Ref Range   Hemoglobin A1C     HbA1c POC (<> result, manual entry)     HbA1c, POC (prediabetic range)     HbA1c, POC (controlled diabetic range) 7.0 0.0 - 7.0 %  -Reports compliance with Rybelsus 3 mg daily and glipizide 5 mg twice a day.  No nausea, vomiting or abdominal pain with Rybelsus. Slight decrease in appetite since being on Rybelsus.  Weight has not changed.  Feels she can do better with her eating habits.  Some dietary indiscretions this past weekend over the Independence Day holiday. Checks blood sugars in the mornings before breakfast but not every morning.  Range 120-140.   HTN/CAD/CHF: Reports compliance with Entresto 97/103 mg twice a day, amlodipine 10 mg daily, metoprolol XL 50 mg daily, Lipitor 80 mg daily, , ASA and furosemide 40 mg daily  -No chest pains, shortness of breath.  Little lower extremity edema. She tries to limit salt in the foods.  She has had 2 falls within the past month.  First fall occurred about a month ago when she tripped on something in her mom's basement.  She did hit the back of her  head.  No loss of consciousness.  Had headache the following day.  Also noted that her dentures were ill fitting and painful to wear for several weeks after that incident.  Just within the last week has been able to wear her dentures and chew her food with them again. -Second fall occurred this past weekend when she was stepping down off of a curb/sidewalk to cross the street.  Her right knee gave out on her.  She is having some soreness in the knee. -She has known arthritis in this knee.  Reports intermittent ache/pain in the knee for the past 3 years after she fell on it 3 years ago.  Tylenol does not help.  She does have Voltaren gel at home but had not been using it.  Did apply some this morning.   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/19/2020   Dyslipidemia, goal LDL below 70 12/09/2019   Papanicolaou smear of cervix with low risk human papillomavirus (HPV) DNA test positive 01/21/2019   Influenza vaccination declined 07/20/2018   Chronic combined systolic and diastolic CHF (congestive heart failure) (HCC) 07/20/2018   Positive depression screening 07/20/2018   CAD (coronary artery disease) 07/09/2018   Type 2 diabetes mellitus without complication, without  long-term current use of insulin (HCC) 06/24/2018   Morbid obesity (HCC)      Current Outpatient Medications on File Prior to Visit  Medication Sig Dispense Refill   Accu-Chek Softclix Lancets lancets Use to check blood sugar daily. E11.9 100 each 2   amLODipine (NORVASC) 10 MG tablet Take 1 tablet (10 mg total) by mouth daily. 90 tablet 1   aspirin (ASPIRIN 81) 81 MG EC tablet Take 1 tablet (81 mg total) by mouth daily. 100 tablet 2   atorvastatin (LIPITOR) 80 MG tablet Take 1 tablet (80 mg total) by mouth daily. 30 tablet 0   Blood Glucose Monitoring Suppl (ACCU-CHEK GUIDE ME) w/Device  KIT Use to check blood sugar daily. E11.9 1 kit 0   butalbital-acetaminophen-caffeine (FIORICET) 50-325-40 MG tablet Take 1-2 tablets by mouth 2 (two) times daily as needed for headache. 20 tablet 0   diclofenac Sodium (VOLTAREN) 1 % GEL Apply 2 g topically 4 (four) times daily. 100 g 1   furosemide (LASIX) 40 MG tablet Take 1 tablet (40 mg total) by mouth daily. 90 tablet 1   glipiZIDE (GLUCOTROL) 5 MG tablet Take 1 tablet (5 mg total) by mouth 2 (two) times daily before a meal. 180 tablet 1   glucose blood (ACCU-CHEK GUIDE) test strip USE TO CHECK BLOOD SUGAR DAILY. 100 strip 2   metoprolol succinate (TOPROL-XL) 50 MG 24 hr tablet Take 1 tablet (50 mg total) by mouth daily. 90 tablet 1   Multiple Vitamins-Minerals (MULTIVITAMIN WITH MINERALS) tablet Take 1 tablet by mouth daily.     Omega-3 Fatty Acids (FISH OIL PO) Take 1,000 mg by mouth daily.      potassium chloride (KLOR-CON) 10 MEQ tablet TAKE 1 TABLET TWICE DAILY 180 tablet 0   sacubitril-valsartan (ENTRESTO) 97-103 MG Take 1 tablet by mouth 2 (two) times daily. 60 tablet 6   No current facility-administered medications on file prior to visit.    Allergies  Allergen Reactions   Lisinopril Cough    Social History   Socioeconomic History   Marital status: Single    Spouse name: Not on file   Number of children: 0   Years of education: Not on file   Highest education level: Master's degree (e.g., MA, MS, MEng, MEd, MSW, MBA)  Occupational History   Occupation: retired  Tobacco Use   Smoking status: Never   Smokeless tobacco: Never  Vaping Use   Vaping Use: Never used  Substance and Sexual Activity   Alcohol use: Yes    Comment: occasionally   Drug use: Never   Sexual activity: Not Currently  Other Topics Concern   Not on file  Social History Narrative   Right handed   Caffeine Intake 1 cup of coffee occassionally   Social Determinants of Health   Financial Resource Strain: Low Risk  (01/22/2023)   Overall Financial  Resource Strain (CARDIA)    Difficulty of Paying Living Expenses: Not hard at all  Food Insecurity: No Food Insecurity (01/22/2023)   Hunger Vital Sign    Worried About Running Out of Food in the Last Year: Never true    Ran Out of Food in the Last Year: Never true  Transportation Needs: No Transportation Needs (01/22/2023)   PRAPARE - Administrator, Civil Service (Medical): No    Lack of Transportation (Non-Medical): No  Physical Activity: Inactive (01/22/2023)   Exercise Vital Sign    Days of Exercise per Week: 0 days    Minutes of  Exercise per Session: 20 min  Stress: No Stress Concern Present (01/22/2023)   [employer] of Occupational Health - Occupational Stress Questionnaire    Feeling of Stress : Not at all  Social Connections: Moderately Isolated (01/22/2023)   Social Connection and Isolation Panel [NHANES]    Frequency of Communication with Friends and Family: More than three times a week    Frequency of Social Gatherings with Friends and Family: Never    Attends Religious Services: Never    Database administrator or Organizations: No    Attends Banker Meetings: 1 to 4 times per year    Marital Status: Never married  Intimate Partner Violence: Not At Risk (05/09/2022)   Humiliation, Afraid, Rape, and Kick questionnaire    Fear of Current or Ex-Partner: No    Emotionally Abused: No    Physically Abused: No    Sexually Abused: No    Family History  Problem Relation Age of Onset   Diabetes Mother    Colon cancer Brother     Past Surgical History:  Procedure Laterality Date   CARDIAC CATHETERIZATION  06/28/2018   CORONARY STENT INTERVENTION N/A 06/28/2018   Procedure: CORONARY STENT INTERVENTION;  Surgeon: Marykay Lex, MD;  Location: [facility] INVASIVE CV LAB;  Service: Cardiovascular;  Laterality: N/A;   CORONARY STENT PLACEMENT  06/28/2018   RIGHT/LEFT HEART CATH AND CORONARY ANGIOGRAPHY N/A 06/28/2018   Procedure: RIGHT/LEFT HEART CATH AND  CORONARY ANGIOGRAPHY;  Surgeon: Marykay Lex, MD;  Location: [facility] INVASIVE CV LAB;  Service: Cardiovascular;  Laterality: N/A;   TOTAL KNEE ARTHROPLASTY Left 05/2016    ROS: Review of Systems Negative except as stated above  PHYSICAL EXAM: BP 115/77 (BP Location: Right Arm, Patient Position: Sitting, Cuff Size: Large)   Pulse 79   Temp 97.7 F (36.5 C) (Oral)   Ht 5\' 8"  (1.727 m)   Wt 289 lb (131.1 kg)   SpO2 96%   BMI 43.94 kg/m   Wt Readings from Last 3 Encounters:  01/26/23 289 lb (131.1 kg)  11/28/22 291 lb 9.6 oz (132.3 kg)  09/19/22 290 lb (131.5 kg)    Physical Exam  General appearance - alert, well appearing, and in no distress Mental status - normal mood, behavior, speech, dress, motor activity, and thought processes Chest - clear to auscultation, no wheezes, rales or rhonchi, symmetric air entry Heart - normal rate, regular rhythm, normal S1, S2, no murmurs, rubs, clicks or gallops Extremities - trace-1+ edema MSK:  RT knee:  jt enlargement.  No signs to suggest effusion.  No tenderness along the medial and lateral jt lines.  Walks with slight limp favoring the right knee. Diabetic Foot Exam - Simple   Simple Foot Form Diabetic Foot exam was performed with the following findings: Yes 01/26/2023 12:25 PM  Visual Inspection See comments: Yes Sensation Testing Intact to touch and monofilament testing bilaterally: Yes Pulse Check Posterior Tibialis and Dorsalis pulse intact bilaterally: Yes Comments Patient with hammertoes of the second third and fourth toes bilaterally.  Small callus on right second and fourth toes dorsal surface and left fourth and fifth toes dorsal surface.  Slight callus forming on the plantar lateral surface of both feet.          Latest Ref Rng & Units 04/25/2022    2:26 PM 04/22/2022   10:54 AM 12/20/2021    4:43 PM  CMP  Glucose 70 - 99 mg/dL 403   474   BUN 8 -  27 mg/dL 15   16   Creatinine 1.61 - 1.00 mg/dL 0.96   0.45   Sodium 409  - 144 mmol/L 143   144   Potassium 3.5 - 5.2 mmol/L 4.7   4.9   Chloride 96 - 106 mmol/L 103   103   CO2 20 - 29 mmol/L 25   24   Calcium 8.7 - 10.3 mg/dL 81.1   9.9   Total Protein 6.0 - 8.5 g/dL  6.9  7.6   Total Bilirubin 0.0 - 1.2 mg/dL  0.5  0.5   Alkaline Phos 44 - 121 IU/L  121  115   AST 0 - 40 IU/L  25  26   ALT 0 - 32 IU/L  30  18    Lipid Panel     Component Value Date/Time   CHOL 157 04/22/2022 1054   TRIG 99 04/22/2022 1054   HDL 48 04/22/2022 1054   CHOLHDL 3.3 04/22/2022 1054   CHOLHDL 5.5 04/27/2018 1513   VLDL 17 04/27/2018 1513   LDLCALC 91 04/22/2022 1054    CBC    Component Value Date/Time   WBC 5.4 12/20/2021 1643   WBC 5.4 06/30/2018 0401   RBC 5.25 12/20/2021 1643   RBC 5.42 (H) 06/30/2018 0401   HGB 14.8 12/20/2021 1643   HCT 44.5 12/20/2021 1643   PLT 238 12/20/2021 1643   MCV 85 12/20/2021 1643   MCH 28.2 12/20/2021 1643   MCH 24.7 (L) 06/30/2018 0401   MCHC 33.3 12/20/2021 1643   MCHC 30.3 06/30/2018 0401   RDW 14.0 12/20/2021 1643   LYMPHSABS 1.7 07/09/2018 0937   MONOABS 0.5 06/24/2018 1305   EOSABS 0.6 (H) 07/09/2018 0937   BASOSABS 0.1 07/09/2018 0937    ASSESSMENT AND PLAN: 1. Type 2 diabetes mellitus with morbid obesity (HCC) Close to goal with A1C of 7; improved from previous. Encourage healthy eating habits. Discussed increasing the Rybelsus from 3 mg daily to 7 mg daily to help get A1c less than 7 and also hopefully achieve some weight loss.  Patient is agreeable to this.  I went over with her again possible side effects of the medication (including nausea, vomiting, pancreatitis, bowel blockage) and signs and symptoms that would suggest that she needs to stop the medication and give me a call.  Advised to stop the medicine if she develops any nausea, unexplained abdominal pain or unable to pass bowels. - POCT glucose (manual entry) - POCT glycosylated hemoglobin (Hb A1C) - Semaglutide (RYBELSUS) 7 MG TABS; Take 1 tablet (7 mg  total) by mouth daily.  Dispense: 30 tablet; Refill: 6  2. Hypertension associated with type 2 diabetes mellitus (HCC) At goal.  Continue Norvasc 10 mg daily, furosemide 40 mg daily, Toprol-XL 50 mg daily And Entresto 97/103 mg twice a day.  3. Chronic combined systolic and diastolic CHF (congestive heart failure) (HCC) Stable and compensated.  Continue furosemide, metoprolol and Entresto.  4. Coronary artery disease involving native coronary artery of native heart without angina pectoris Stable.  Continue aspirin, Toprol and atorvastatin  5. History of recent fall Discussed referral to physical therapy for gait safety training and to assess need for any assistive device.  Patient declines at this time.  She will let me know if she changes her mind.  6. Osteoarthritis of right knee, unspecified osteoarthritis type Advised that she can use the Voltaren gel up to 4 times a day as needed.  Let me know if pain becomes more  consistent at which point we can refer her to orthopedics.    Patient was given the opportunity to ask questions.  Patient verbalized understanding of the plan and was able to repeat key elements of the plan.   This documentation was completed using Paediatric nurse.  Any transcriptional errors are unintentional.  Orders Placed This Encounter  Procedures   POCT glucose (manual entry)   POCT glycosylated hemoglobin (Hb A1C)     Requested Prescriptions   Signed Prescriptions Disp Refills   Semaglutide (RYBELSUS) 7 MG TABS 30 tablet 6    Sig: Take 1 tablet (7 mg total) by mouth daily.    Return in about 4 months (around 05/29/2023).  Jonah Blue, MD, FACP

## 2023-01-26 NOTE — Patient Instructions (Signed)
Increase Rybelsus to 7 mg daily. Decrease Glipizide to 5 mg once a day. You can use the Voltaren Gel 2-4 times a day.

## 2023-01-27 ENCOUNTER — Other Ambulatory Visit: Payer: Self-pay

## 2023-01-30 ENCOUNTER — Other Ambulatory Visit: Payer: Self-pay

## 2023-02-03 ENCOUNTER — Other Ambulatory Visit: Payer: Self-pay | Admitting: Internal Medicine

## 2023-02-03 ENCOUNTER — Other Ambulatory Visit: Payer: Self-pay | Admitting: Psychiatry

## 2023-02-03 DIAGNOSIS — I5042 Chronic combined systolic (congestive) and diastolic (congestive) heart failure: Secondary | ICD-10-CM

## 2023-02-04 ENCOUNTER — Other Ambulatory Visit: Payer: Self-pay

## 2023-02-04 MED ORDER — FUROSEMIDE 40 MG PO TABS
40.0000 mg | ORAL_TABLET | Freq: Every day | ORAL | 1 refills | Status: DC
  Filled 2023-02-04: qty 90, 90d supply, fill #0
  Filled 2023-06-29: qty 90, 90d supply, fill #1

## 2023-02-05 ENCOUNTER — Other Ambulatory Visit: Payer: Self-pay

## 2023-02-05 ENCOUNTER — Encounter: Payer: Self-pay | Admitting: Pharmacist

## 2023-02-09 ENCOUNTER — Other Ambulatory Visit: Payer: Self-pay

## 2023-02-24 ENCOUNTER — Ambulatory Visit: Payer: Medicare HMO | Attending: Internal Medicine

## 2023-02-24 VITALS — Ht 68.0 in | Wt 289.0 lb

## 2023-02-24 DIAGNOSIS — Z0000 Encounter for general adult medical examination without abnormal findings: Secondary | ICD-10-CM

## 2023-02-24 DIAGNOSIS — Z1231 Encounter for screening mammogram for malignant neoplasm of breast: Secondary | ICD-10-CM

## 2023-02-24 NOTE — Patient Instructions (Signed)
Tracey Gallegos , Thank you for taking time to come for your Medicare Wellness Visit. I appreciate your ongoing commitment to your health goals. Please review the following plan we discussed and let me know if I can assist you in the future.   Referrals/Orders/Follow-Ups/Clinician Recommendations: Aim for 30 minutes of exercise or brisk walking, 6-8 glasses of water, and 5 servings of fruits and vegetables each day.  You have an order for:  []   2D Mammogram  [x]   3D Mammogram  []   Bone Density     Please call for appointment:  [facility] [address] [place], [place] [address] [phone]    Make sure to wear two-piece clothing.  No lotions, powders, or deodorants the day of the appointment. Make sure to bring picture ID and insurance card.  Bring list of medications you are currently taking including any supplements.   Schedule your [facility] screening mammogram through MyChart!   Log into your MyChart account.  Go to 'Visit' (or 'Appointments' if on mobile App) --> Schedule an Appointment  Under 'Select a Reason for Visit' choose the Mammogram Screening option.  Complete the pre-visit questions and select the time and place that best fits your schedule.    This is a list of the screening recommended for you and due dates:  Health Maintenance  Topic Date Due   COVID-19 Vaccine (3 - 2023-24 season) 03/12/2023*   Zoster (Shingles) Vaccine (1 of 2) 03/24/2023*   Flu Shot  10/19/2023*   Yearly kidney function blood test for diabetes  04/26/2023   Yearly kidney health urinalysis for diabetes  04/26/2023   Eye exam for diabetics  05/01/2023   Mammogram  05/14/2023   Hemoglobin A1C  07/29/2023   Complete foot exam   01/26/2024   Colon Cancer Screening  02/02/2024   Medicare Annual Wellness Visit  02/24/2024   Pneumonia Vaccine (3 of 3 - PPSV23 or PCV20) 01/30/2025   DTaP/Tdap/Td vaccine (2 - Td or Tdap) 07/20/2028   DEXA scan (bone density  measurement)  Completed   Hepatitis C Screening  Completed   HPV Vaccine  Aged Out   Pap Smear  Discontinued  *Topic was postponed. The date shown is not the original due date.    Advanced directives: (ACP Link)Information on Advanced Care Planning can be found at [facility] Advance Health Care Directives Advance Health Care Directives ([email or web address]   Next Medicare Annual Wellness Visit scheduled for next year: Yes  Preventive Care 65 Years and Older, Female Preventive care refers to lifestyle choices and visits with your health care provider that can promote health and wellness. What does preventive care include? A yearly physical exam. This is also called an annual well check. Dental exams once or twice a year. Routine eye exams. Ask your health care provider how often you should have your eyes checked. Personal lifestyle choices, including: Daily care of your teeth and gums. Regular physical activity. Eating a healthy diet. Avoiding tobacco and drug use. Limiting alcohol use. Practicing safe sex. Taking low-dose aspirin every day. Taking vitamin and mineral supplements as recommended by your health care provider. What happens during an annual well check? The services and screenings done by your health care provider during your annual well check will depend on your age, overall health, lifestyle risk factors, and family history of disease. Counseling  Your health care provider may ask you questions about your: Alcohol use. Tobacco use. Drug use. Emotional well-being. Home and  relationship well-being. Sexual activity. Eating habits. History of falls. Memory and ability to understand (cognition). Work and work Astronomer. Reproductive health. Screening  You may have the following tests or measurements: Height, weight, and BMI. Blood pressure. Lipid and cholesterol levels. These may be checked every 5 years, or more frequently if you are over 68 years  old. Skin check. Lung cancer screening. You may have this screening every year starting at age 68 if you have a 30-pack-year history of smoking and currently smoke or have quit within the past 15 years. Fecal occult blood test (FOBT) of the stool. You may have this test every year starting at age 65. Flexible sigmoidoscopy or colonoscopy. You may have a sigmoidoscopy every 5 years or a colonoscopy every 10 years starting at age 68. Hepatitis C blood test. Hepatitis B blood test. Sexually transmitted disease (STD) testing. Diabetes screening. This is done by checking your blood sugar (glucose) after you have not eaten for a while (fasting). You may have this done every 1-3 years. Bone density scan. This is done to screen for osteoporosis. You may have this done starting at age 68. Mammogram. This may be done every 1-2 years. Talk to your health care provider about how often you should have regular mammograms. Talk with your health care provider about your test results, treatment options, and if necessary, the need for more tests. Vaccines  Your health care provider may recommend certain vaccines, such as: Influenza vaccine. This is recommended every year. Tetanus, diphtheria, and acellular pertussis (Tdap, Td) vaccine. You may need a Td booster every 10 years. Zoster vaccine. You may need this after age 68. Pneumococcal 13-valent conjugate (PCV13) vaccine. One dose is recommended after age 68. Pneumococcal polysaccharide (PPSV23) vaccine. One dose is recommended after age 68. Talk to your health care provider about which screenings and vaccines you need and how often you need them. This information is not intended to replace advice given to you by your health care provider. Make sure you discuss any questions you have with your health care provider. Document Released: 08/03/2015 Document Revised: 03/26/2016 Document Reviewed: 05/08/2015 Elsevier Interactive Patient Education  2017 [employer].  Fall Prevention in the Home Falls can cause injuries. They can happen to people of all ages. There are many things you can do to make your home safe and to help prevent falls. What can I do on the outside of my home? Regularly fix the edges of walkways and driveways and fix any cracks. Remove anything that might make you trip as you walk through a door, such as a raised step or threshold. Trim any bushes or trees on the path to your home. Use bright outdoor lighting. Clear any walking paths of anything that might make someone trip, such as rocks or tools. Regularly check to see if handrails are loose or broken. Make sure that both sides of any steps have handrails. Any raised decks and porches should have guardrails on the edges. Have any leaves, snow, or ice cleared regularly. Use sand or salt on walking paths during winter. Clean up any spills in your garage right away. This includes oil or grease spills. What can I do in the bathroom? Use night lights. Install grab bars by the toilet and in the tub and shower. Do not use towel bars as grab bars. Use non-skid mats or decals in the tub or shower. If you need to sit down in the shower, use a plastic, non-slip stool. Keep the floor dry.  Clean up any water that spills on the floor as soon as it happens. Remove soap buildup in the tub or shower regularly. Attach bath mats securely with double-sided non-slip rug tape. Do not have throw rugs and other things on the floor that can make you trip. What can I do in the bedroom? Use night lights. Make sure that you have a light by your bed that is easy to reach. Do not use any sheets or blankets that are too big for your bed. They should not hang down onto the floor. Have a firm chair that has side arms. You can use this for support while you get dressed. Do not have throw rugs and other things on the floor that can make you trip. What can I do in the kitchen? Clean up any spills right  away. Avoid walking on wet floors. Keep items that you use a lot in easy-to-reach places. If you need to reach something above you, use a strong step stool that has a grab bar. Keep electrical cords out of the way. Do not use floor polish or wax that makes floors slippery. If you must use wax, use non-skid floor wax. Do not have throw rugs and other things on the floor that can make you trip. What can I do with my stairs? Do not leave any items on the stairs. Make sure that there are handrails on both sides of the stairs and use them. Fix handrails that are broken or loose. Make sure that handrails are as long as the stairways. Check any carpeting to make sure that it is firmly attached to the stairs. Fix any carpet that is loose or worn. Avoid having throw rugs at the top or bottom of the stairs. If you do have throw rugs, attach them to the floor with carpet tape. Make sure that you have a light switch at the top of the stairs and the bottom of the stairs. If you do not have them, ask someone to add them for you. What else can I do to help prevent falls? Wear shoes that: Do not have high heels. Have rubber bottoms. Are comfortable and fit you well. Are closed at the toe. Do not wear sandals. If you use a stepladder: Make sure that it is fully opened. Do not climb a closed stepladder. Make sure that both sides of the stepladder are locked into place. Ask someone to hold it for you, if possible. Clearly mark and make sure that you can see: Any grab bars or handrails. First and last steps. Where the edge of each step is. Use tools that help you move around (mobility aids) if they are needed. These include: Canes. Walkers. Scooters. Crutches. Turn on the lights when you go into a dark area. Replace any light bulbs as soon as they burn out. Set up your furniture so you have a clear path. Avoid moving your furniture around. If any of your floors are uneven, fix them. If there are any  pets around you, be aware of where they are. Review your medicines with your doctor. Some medicines can make you feel dizzy. This can increase your chance of falling. Ask your doctor what other things that you can do to help prevent falls. This information is not intended to replace advice given to you by your health care provider. Make sure you discuss any questions you have with your health care provider. Document Released: 05/03/2009 Document Revised: 12/13/2015 Document Reviewed: 08/11/2014 Elsevier Interactive Patient Education  2017 [employer]

## 2023-02-24 NOTE — Progress Notes (Signed)
Subjective:   Tracey Gallegos is a 67 y.o. female who presents for Medicare Annual (Subsequent) preventive examination.  Visit Complete: Virtual  I connected with  Tracey Gallegos on 02/24/23 by a audio enabled telemedicine application and verified that I am speaking with the correct person using two identifiers.  Patient Location: Home  Provider Location: Home Office  I discussed the limitations of evaluation and management by telemedicine. The patient expressed understanding and agreed to proceed.  Vital Signs: Per patient no change in vitals since last visit.  Review of Systems     Cardiac Risk Factors include: advanced age (>70men, >51 women);diabetes mellitus;hypertension;dyslipidemia     Objective:    Today's Vitals   02/24/23 2116  Weight: 289 lb (131.1 kg)  Height: 5\' 8"  (1.727 m)   Body mass index is 43.94 kg/m.     02/24/2023    9:30 PM 05/09/2022    4:40 PM 05/09/2022    1:09 PM 02/04/2022    1:21 PM 03/23/2021   11:13 AM 06/09/2020    8:20 PM 06/24/2018   11:03 PM  Advanced Directives  Does Patient Have a Medical Advance Directive? No No No No No No   Would patient like information on creating a medical advance directive? Yes (MAU/Ambulatory/Procedural Areas - Information given) No - Patient declined No - Patient declined No - Patient declined No - Patient declined  No - Patient declined    Current Medications (verified) Outpatient Encounter Medications as of 02/24/2023  Medication Sig   Accu-Chek Softclix Lancets lancets Use to check blood sugar daily. E11.9   amLODipine (NORVASC) 10 MG tablet Take 1 tablet (10 mg total) by mouth daily.   aspirin (ASPIRIN 81) 81 MG EC tablet Take 1 tablet (81 mg total) by mouth daily.   atorvastatin (LIPITOR) 80 MG tablet Take 1 tablet (80 mg total) by mouth daily.   Blood Glucose Monitoring Suppl (ACCU-CHEK GUIDE ME) w/Device KIT Use to check blood sugar daily. E11.9   butalbital-acetaminophen-caffeine (FIORICET) 50-325-40 MG  tablet Take 1-2 tablets by mouth 2 (two) times daily as needed for headache.   diclofenac Sodium (VOLTAREN) 1 % GEL Apply 2 g topically 4 (four) times daily.   furosemide (LASIX) 40 MG tablet Take 1 tablet (40 mg total) by mouth daily.   glipiZIDE (GLUCOTROL) 5 MG tablet Take 1 tablet (5 mg total) by mouth 2 (two) times daily before a meal.   metoprolol succinate (TOPROL-XL) 50 MG 24 hr tablet Take 1 tablet (50 mg total) by mouth daily.   Multiple Vitamins-Minerals (MULTIVITAMIN WITH MINERALS) tablet Take 1 tablet by mouth daily.   Omega-3 Fatty Acids (FISH OIL PO) Take 1,000 mg by mouth daily.    potassium chloride (KLOR-CON) 10 MEQ tablet TAKE 1 TABLET TWICE DAILY   sacubitril-valsartan (ENTRESTO) 97-103 MG Take 1 tablet by mouth 2 (two) times daily.   Semaglutide (RYBELSUS) 7 MG TABS Take 1 tablet (7 mg total) by mouth daily.   No facility-administered encounter medications on file as of 02/24/2023.    Allergies (verified) Lisinopril   History: Past Medical History:  Diagnosis Date   CHF (congestive heart failure) (HCC)    Coronary artery disease    Diabetes mellitus without complication (HCC)    Dyspnea    Hypertension    Past Surgical History:  Procedure Laterality Date   CARDIAC CATHETERIZATION  06/28/2018   CORONARY STENT INTERVENTION N/A 06/28/2018   Procedure: CORONARY STENT INTERVENTION;  Surgeon: Marykay Lex, MD;  Location: [facility] INVASIVE  CV LAB;  Service: Cardiovascular;  Laterality: N/A;   CORONARY STENT PLACEMENT  06/28/2018   RIGHT/LEFT HEART CATH AND CORONARY ANGIOGRAPHY N/A 06/28/2018   Procedure: RIGHT/LEFT HEART CATH AND CORONARY ANGIOGRAPHY;  Surgeon: Marykay Lex, MD;  Location: [facility] INVASIVE CV LAB;  Service: Cardiovascular;  Laterality: N/A;   TOTAL KNEE ARTHROPLASTY Left 05/2016   Family History  Problem Relation Age of Onset   Diabetes Mother    Colon cancer Brother    Social History   Socioeconomic History   Marital status: Single    Spouse name:  Not on file   Number of children: 0   Years of education: Not on file   Highest education level: Master's degree (e.g., MA, MS, MEng, MEd, MSW, MBA)  Occupational History   Occupation: retired  Tobacco Use   Smoking status: Never   Smokeless tobacco: Never  Vaping Use   Vaping status: Never Used  Substance and Sexual Activity   Alcohol use: Yes    Comment: occasionally   Drug use: Never   Sexual activity: Not Currently  Other Topics Concern   Not on file  Social History Narrative   Right handed   Caffeine Intake 1 cup of coffee occassionally   Social Determinants of Health   Financial Resource Strain: Low Risk  (02/24/2023)   Overall Financial Resource Strain (CARDIA)    Difficulty of Paying Living Expenses: Not hard at all  Food Insecurity: No Food Insecurity (02/24/2023)   Hunger Vital Sign    Worried About Running Out of Food in the Last Year: Never true    Ran Out of Food in the Last Year: Never true  Transportation Needs: No Transportation Needs (02/24/2023)   PRAPARE - Administrator, Civil Service (Medical): No    Lack of Transportation (Non-Medical): No  Physical Activity: Inactive (02/24/2023)   Exercise Vital Sign    Days of Exercise per Week: 0 days    Minutes of Exercise per Session: 0 min  Stress: No Stress Concern Present (02/24/2023)   [employer] of Occupational Health - Occupational Stress Questionnaire    Feeling of Stress : Not at all  Social Connections: Socially Isolated (02/24/2023)   Social Connection and Isolation Panel [NHANES]    Frequency of Communication with Friends and Family: More than three times a week    Frequency of Social Gatherings with Friends and Family: Once a week    Attends Religious Services: Never    Database administrator or Organizations: No    Attends Engineer, structural: Never    Marital Status: Never married    Tobacco Counseling Counseling given: Not Answered   Clinical Intake:  Pre-visit  preparation completed: Yes  Pain : No/denies pain     Diabetes: Yes CBG done?: No Did pt. bring in CBG monitor from home?: No  How often do you need to have someone help you when you read instructions, pamphlets, or other written materials from your doctor or pharmacy?: 1 - Never  Interpreter Needed?: No  Information entered by :: Kandis Fantasia LPN   Activities of Daily Living    02/24/2023    9:30 PM 05/09/2022   12:30 PM  In your present state of health, do you have any difficulty performing the following activities:  Hearing? 0 0  Vision? 0 0  Difficulty concentrating or making decisions? 0 0  Walking or climbing stairs? 0 0  Dressing or bathing? 0 0  Doing errands, shopping? 0  0  Preparing Food and eating ? N N  Using the Toilet? N N  In the past six months, have you accidently leaked urine? N N  Do you have problems with loss of bowel control? N N  Managing your Medications? N N  Managing your Finances? N N  Housekeeping or managing your Housekeeping? N N    Patient Care Team: Marcine Matar, MD as PCP - General (Internal Medicine) Rennis Golden Lisette Abu, MD as PCP - Cardiology (Cardiology)  Indicate any recent Medical Services you may have received from other than [facility] providers in the past year (date may be approximate).     Assessment:   This is a routine wellness examination for [facility].  Hearing/Vision screen Hearing Screening - Comments:: Denies hearing difficulties   Vision Screening - Comments:: Wears rx glasses - up to date with routine eye exams with [facility]    Dietary issues and exercise activities discussed:     Goals Addressed             This Visit's Progress    Remain active and independent        Depression Screen    02/24/2023    9:28 PM 01/26/2023   11:11 AM 09/19/2022    1:45 PM 05/09/2022   12:15 PM 01/16/2022    2:51 PM 12/20/2021    3:43 PM 08/22/2021    2:40 PM  PHQ 2/9 Scores  PHQ - 2 Score 0 0 0 0 0 0 0  PHQ- 9 Score 0  1 0  0 1     Fall Risk    02/24/2023    9:29 PM 01/26/2023   11:05 AM 09/19/2022    1:38 PM 05/09/2022   12:15 PM 04/25/2022    1:42 PM  Fall Risk   Falls in the past year? 1 1 0 0 0  Number falls in past yr: 1 1 0 0 0  Injury with Fall? 0 0 0 0 0  Risk for fall due to : History of fall(s);Impaired balance/gait;Impaired mobility History of fall(s) No Fall Risks No Fall Risks No Fall Risks  Follow up Education provided;Falls prevention discussed;Falls evaluation completed   Falls evaluation completed Falls evaluation completed    MEDICARE RISK AT HOME:  Medicare Risk at Home - 02/24/23 2129     Any stairs in or around the home? No    If so, are there any without handrails? No    Home free of loose throw rugs in walkways, pet beds, electrical cords, etc? Yes    Adequate lighting in your home to reduce risk of falls? Yes    Life alert? No    Use of a cane, walker or w/c? No    Grab bars in the bathroom? Yes    Shower chair or bench in shower? No    Elevated toilet seat or a handicapped toilet? No             TIMED UP AND GO:  Was the test performed?  No    Cognitive Function:        02/24/2023    9:30 PM 05/09/2022    1:11 PM 03/23/2021   11:17 AM  6CIT Screen  What Year? 0 points 0 points 0 points  What month? 0 points 0 points 0 points  What time? 0 points 0 points 0 points  Count back from 20 0 points 0 points 0 points  Months in reverse 0 points 0 points  0 points  Repeat phrase 0 points 0 points 0 points  Total Score 0 points 0 points 0 points    Immunizations Immunization History  Administered Date(s) Administered   PFIZER(Purple Top)SARS-COV-2 Vaccination 10/14/2019, 11/09/2019   Pneumococcal Conjugate-13 01/31/2020   Pneumococcal Polysaccharide-23 07/20/2018   Tdap 07/20/2018    TDAP status: Up to date  Flu Vaccine status: Declined, Education has been provided regarding the importance of this vaccine but patient still declined. Advised may receive this  vaccine at local pharmacy or Health Dept. Aware to provide a copy of the vaccination record if obtained from local pharmacy or Health Dept. Verbalized acceptance and understanding.  Pneumococcal vaccine status: Up to date  Covid-19 vaccine status: Information provided on how to obtain vaccines.   Qualifies for Shingles Vaccine? Yes   Zostavax completed No   Shingrix Completed?: No.    Education has been provided regarding the importance of this vaccine. Patient has been advised to call insurance company to determine out of pocket expense if they have not yet received this vaccine. Advised may also receive vaccine at local pharmacy or Health Dept. Verbalized acceptance and understanding.  Screening Tests Health Maintenance  Topic Date Due   COVID-19 Vaccine (3 - 2023-24 season) 03/12/2023 (Originally 03/21/2022)   Zoster Vaccines- Shingrix (1 of 2) 03/24/2023 (Originally 12/21/2004)   INFLUENZA VACCINE  10/19/2023 (Originally 02/19/2023)   Diabetic kidney evaluation - eGFR measurement  04/26/2023   Diabetic kidney evaluation - Urine ACR  04/26/2023   OPHTHALMOLOGY EXAM  05/01/2023   MAMMOGRAM  05/14/2023   HEMOGLOBIN A1C  07/29/2023   FOOT EXAM  01/26/2024   Colonoscopy  02/02/2024   Medicare Annual Wellness (AWV)  02/24/2024   Pneumonia Vaccine 6+ Years old (3 of 3 - PPSV23 or PCV20) 01/30/2025   DTaP/Tdap/Td (2 - Td or Tdap) 07/20/2028   DEXA SCAN  Completed   Hepatitis C Screening  Completed   HPV VACCINES  Aged Out   PAP SMEAR-Modifier  Discontinued    Health Maintenance  There are no preventive care reminders to display for this patient.   Colorectal cancer screening: Type of screening: Colonoscopy. Completed 02/01/14. Repeat every 10 years  Mammogram status: Ordered today. Pt provided with contact info and advised to call to schedule appt.   Bone Density status: Completed 10/23/20. Results reflect: Bone density results: OSTEOPENIA. Repeat every 2 years.  Lung Cancer Screening:  (Low Dose CT Chest recommended if Age 35-80 years, 20 pack-year currently smoking OR have quit w/in 15years.) does not qualify.   Lung Cancer Screening Referral: n/a  Additional Screening:  Hepatitis C Screening: does qualify; Completed 01/17/19  Vision Screening: Recommended annual ophthalmology exams for early detection of glaucoma and other disorders of the eye. Is the patient up to date with their annual eye exam?  Yes  Who is the provider or what is the name of the office in which the patient attends annual eye exams? [facility] If pt is not established with a provider, would they like to be referred to a provider to establish care? No .   Dental Screening: Recommended annual dental exams for proper oral hygiene  Diabetic Foot Exam: Diabetic Foot Exam: Completed 78/24  Community Resource Referral / Chronic Care Management: CRR required this visit?  No   CCM required this visit?  No     Plan:     I have personally reviewed and noted the following in the patient's chart:   Medical and social history Use of  alcohol, tobacco or illicit drugs  Current medications and supplements including opioid prescriptions. Patient is not currently taking opioid prescriptions. Functional ability and status Nutritional status Physical activity Advanced directives List of other physicians Hospitalizations, surgeries, and ER visits in previous 12 months Vitals Screenings to include cognitive, depression, and falls Referrals and appointments  In addition, I have reviewed and discussed with patient certain preventive protocols, quality metrics, and best practice recommendations. A written personalized care plan for preventive services as well as general preventive health recommendations were provided to patient.     Kandis Fantasia [place], [place]   4/0/9811   After Visit Summary: (MyChart) Due to this being a telephonic visit, the after visit summary with patients personalized plan  was offered to patient via MyChart   Nurse Notes:No concerns at this time

## 2023-02-27 ENCOUNTER — Ambulatory Visit
Admission: RE | Admit: 2023-02-27 | Discharge: 2023-02-27 | Disposition: A | Discharge status: Home / Self Care | Payer: Medicare HMO | Source: Ambulatory Visit | Attending: Internal Medicine | Admitting: Internal Medicine

## 2023-02-27 DIAGNOSIS — Z1231 Encounter for screening mammogram for malignant neoplasm of breast: Secondary | ICD-10-CM | POA: Diagnosis not present

## 2023-03-13 ENCOUNTER — Other Ambulatory Visit: Payer: Self-pay

## 2023-03-13 ENCOUNTER — Other Ambulatory Visit: Payer: Self-pay | Admitting: Internal Medicine

## 2023-03-13 DIAGNOSIS — M542 Cervicalgia: Secondary | ICD-10-CM

## 2023-03-13 MED ORDER — DICLOFENAC SODIUM 1 % EX GEL
2.0000 g | Freq: Four times a day (QID) | CUTANEOUS | 1 refills | Status: DC
  Filled 2023-03-13: qty 100, 13d supply, fill #0
  Filled 2023-11-30: qty 100, 13d supply, fill #1

## 2023-03-16 ENCOUNTER — Other Ambulatory Visit: Payer: Self-pay

## 2023-03-18 ENCOUNTER — Telehealth: Payer: Self-pay | Admitting: Pharmacist

## 2023-03-18 ENCOUNTER — Other Ambulatory Visit: Payer: Self-pay | Admitting: Pharmacist

## 2023-03-18 ENCOUNTER — Other Ambulatory Visit: Payer: Self-pay

## 2023-03-18 MED ORDER — ATORVASTATIN CALCIUM 80 MG PO TABS
80.0000 mg | ORAL_TABLET | Freq: Every day | ORAL | 1 refills | Status: DC
  Filled 2023-03-18: qty 90, 90d supply, fill #0
  Filled 2023-06-29: qty 90, 90d supply, fill #1

## 2023-03-18 NOTE — Telephone Encounter (Signed)
Hey friend,   This patient showed up on an adherence report for Rybelsus and atorvastatin. I sent a 90-ds of atorvastatin today. Regarding the Rybelsus, Dr. Laural Benes increased to 7mg  daily 01/26/23 but I don't think she's picked this up. I wanted to make sure this wasn't an insurance or PA issue.

## 2023-03-18 NOTE — Progress Notes (Signed)
Pharmacy Quality Measure Review  This patient is appearing on a report for being at risk of failing the adherence measure for cholesterol (statin) and diabetes medications this calendar year.   Medication: atorvastatin 80 mg daily Last fill date: 01/12/2023 for 30 day supply  Medication: Rybelsus 3 mg daily  Last fill date: 01/12/2023 for 30 day supply Of note, Dr. Laural Benes increased to 7mg  daily dose last month during an office visit. Pt has not picked this up yet. Message sent to our patient advocate in [facility] pharmacy to make sure this isn't a PA or insurance issue.   I sent a 90-ds with refills of the atorvastatin.   MyChart message sent to patient.   Butch Penny, PharmD, P[place]y [place], CPP Clinical Pharmacist [facility] [phone]

## 2023-03-19 ENCOUNTER — Other Ambulatory Visit: Payer: Self-pay

## 2023-03-20 ENCOUNTER — Other Ambulatory Visit: Payer: Self-pay

## 2023-03-25 ENCOUNTER — Other Ambulatory Visit: Payer: Self-pay

## 2023-03-27 ENCOUNTER — Other Ambulatory Visit: Payer: Self-pay

## 2023-03-27 ENCOUNTER — Telehealth: Payer: Self-pay

## 2023-03-27 NOTE — Telephone Encounter (Signed)
Submitted application for RYBELSUS to [employer] for patient assistance.   Phone: [phone]  

## 2023-03-31 ENCOUNTER — Other Ambulatory Visit: Payer: Self-pay

## 2023-04-03 ENCOUNTER — Telehealth: Payer: Self-pay

## 2023-04-03 ENCOUNTER — Other Ambulatory Visit: Payer: Self-pay

## 2023-04-03 NOTE — Telephone Encounter (Signed)
Received notification from [employer] regarding approval for RYBELSUS. Patient assistance approved from 03/31/2023 to 07/21/2023.  Phone: [phone]  PT ID: [id number]  Patient is aware.

## 2023-04-17 ENCOUNTER — Other Ambulatory Visit: Payer: Self-pay

## 2023-04-20 ENCOUNTER — Other Ambulatory Visit: Payer: Self-pay

## 2023-04-27 ENCOUNTER — Other Ambulatory Visit: Payer: Self-pay

## 2023-04-27 ENCOUNTER — Other Ambulatory Visit: Payer: Self-pay | Admitting: Internal Medicine

## 2023-04-27 DIAGNOSIS — E1159 Type 2 diabetes mellitus with other circulatory complications: Secondary | ICD-10-CM

## 2023-04-27 DIAGNOSIS — E1169 Type 2 diabetes mellitus with other specified complication: Secondary | ICD-10-CM

## 2023-04-27 MED ORDER — GLIPIZIDE 5 MG PO TABS
5.0000 mg | ORAL_TABLET | Freq: Two times a day (BID) | ORAL | 1 refills | Status: DC
  Filled 2023-04-27: qty 180, 90d supply, fill #0
  Filled 2023-10-30: qty 180, 90d supply, fill #1

## 2023-04-27 MED ORDER — ACCU-CHEK GUIDE VI STRP
ORAL_STRIP | 2 refills | Status: AC
  Filled 2023-04-27: qty 50, 50d supply, fill #0

## 2023-04-28 ENCOUNTER — Other Ambulatory Visit: Payer: Self-pay

## 2023-05-06 DIAGNOSIS — H1045 Other chronic allergic conjunctivitis: Secondary | ICD-10-CM | POA: Diagnosis not present

## 2023-05-06 DIAGNOSIS — H35033 Hypertensive retinopathy, bilateral: Secondary | ICD-10-CM | POA: Diagnosis not present

## 2023-05-06 DIAGNOSIS — E119 Type 2 diabetes mellitus without complications: Secondary | ICD-10-CM | POA: Diagnosis not present

## 2023-05-06 DIAGNOSIS — H25813 Combined forms of age-related cataract, bilateral: Secondary | ICD-10-CM | POA: Diagnosis not present

## 2023-05-06 LAB — HM DIABETES EYE EXAM

## 2023-05-29 ENCOUNTER — Other Ambulatory Visit: Payer: Self-pay

## 2023-05-29 ENCOUNTER — Ambulatory Visit: Payer: Medicare HMO | Admitting: Internal Medicine

## 2023-06-02 ENCOUNTER — Other Ambulatory Visit: Payer: Self-pay

## 2023-06-24 ENCOUNTER — Other Ambulatory Visit: Payer: Self-pay

## 2023-06-25 ENCOUNTER — Other Ambulatory Visit: Payer: Self-pay

## 2023-06-29 ENCOUNTER — Other Ambulatory Visit: Payer: Self-pay

## 2023-06-29 ENCOUNTER — Other Ambulatory Visit: Payer: Self-pay | Admitting: Internal Medicine

## 2023-06-29 DIAGNOSIS — I2510 Atherosclerotic heart disease of native coronary artery without angina pectoris: Secondary | ICD-10-CM

## 2023-06-29 DIAGNOSIS — E1159 Type 2 diabetes mellitus with other circulatory complications: Secondary | ICD-10-CM

## 2023-06-29 MED ORDER — METOPROLOL SUCCINATE ER 50 MG PO TB24
50.0000 mg | ORAL_TABLET | Freq: Every day | ORAL | 0 refills | Status: DC
  Filled 2023-06-29: qty 90, 90d supply, fill #0

## 2023-06-29 MED ORDER — AMLODIPINE BESYLATE 10 MG PO TABS
10.0000 mg | ORAL_TABLET | Freq: Every day | ORAL | 0 refills | Status: DC
  Filled 2023-06-29: qty 90, 90d supply, fill #0

## 2023-06-30 ENCOUNTER — Other Ambulatory Visit: Payer: Self-pay

## 2023-07-06 ENCOUNTER — Other Ambulatory Visit: Payer: Self-pay

## 2023-07-17 ENCOUNTER — Other Ambulatory Visit: Payer: Self-pay

## 2023-07-31 ENCOUNTER — Other Ambulatory Visit: Payer: Self-pay

## 2023-08-04 ENCOUNTER — Ambulatory Visit: Payer: Medicare HMO | Admitting: Internal Medicine

## 2023-08-07 ENCOUNTER — Other Ambulatory Visit: Payer: Self-pay

## 2023-08-10 ENCOUNTER — Telehealth: Payer: Self-pay

## 2023-08-10 ENCOUNTER — Other Ambulatory Visit: Payer: Self-pay

## 2023-08-10 NOTE — Telephone Encounter (Signed)
Received notification from [employer] regarding patient assistance DENIAL for ENTRESTO 97/103.   [phone]

## 2023-08-14 ENCOUNTER — Other Ambulatory Visit: Payer: Self-pay

## 2023-08-31 ENCOUNTER — Other Ambulatory Visit: Payer: Self-pay

## 2023-09-07 ENCOUNTER — Other Ambulatory Visit: Payer: Self-pay

## 2023-09-10 ENCOUNTER — Other Ambulatory Visit: Payer: Self-pay

## 2023-09-21 ENCOUNTER — Ambulatory Visit: Payer: Medicare HMO | Admitting: Internal Medicine

## 2023-09-23 ENCOUNTER — Other Ambulatory Visit: Payer: Self-pay

## 2023-10-30 ENCOUNTER — Other Ambulatory Visit: Payer: Self-pay | Admitting: Internal Medicine

## 2023-10-30 ENCOUNTER — Other Ambulatory Visit: Payer: Self-pay

## 2023-10-30 DIAGNOSIS — I5042 Chronic combined systolic (congestive) and diastolic (congestive) heart failure: Secondary | ICD-10-CM

## 2023-10-30 DIAGNOSIS — I2510 Atherosclerotic heart disease of native coronary artery without angina pectoris: Secondary | ICD-10-CM

## 2023-10-30 DIAGNOSIS — E1159 Type 2 diabetes mellitus with other circulatory complications: Secondary | ICD-10-CM

## 2023-10-30 MED ORDER — FUROSEMIDE 40 MG PO TABS
40.0000 mg | ORAL_TABLET | Freq: Every day | ORAL | 0 refills | Status: DC
  Filled 2023-10-30: qty 30, 30d supply, fill #0

## 2023-10-30 MED ORDER — ATORVASTATIN CALCIUM 80 MG PO TABS
80.0000 mg | ORAL_TABLET | Freq: Every day | ORAL | 0 refills | Status: DC
  Filled 2023-10-30: qty 30, 30d supply, fill #0

## 2023-10-30 MED ORDER — AMLODIPINE BESYLATE 10 MG PO TABS
10.0000 mg | ORAL_TABLET | Freq: Every day | ORAL | 0 refills | Status: DC
  Filled 2023-10-30: qty 30, 30d supply, fill #0

## 2023-10-30 MED ORDER — METOPROLOL SUCCINATE ER 50 MG PO TB24
50.0000 mg | ORAL_TABLET | Freq: Every day | ORAL | 0 refills | Status: DC
  Filled 2023-10-30: qty 30, 30d supply, fill #0

## 2023-11-03 ENCOUNTER — Other Ambulatory Visit: Payer: Self-pay

## 2023-11-16 ENCOUNTER — Other Ambulatory Visit: Payer: Self-pay

## 2023-11-17 ENCOUNTER — Other Ambulatory Visit: Payer: Self-pay

## 2023-11-17 ENCOUNTER — Ambulatory Visit: Payer: Medicare HMO | Attending: Internal Medicine | Admitting: Internal Medicine

## 2023-11-17 DIAGNOSIS — E1159 Type 2 diabetes mellitus with other circulatory complications: Secondary | ICD-10-CM | POA: Diagnosis not present

## 2023-11-17 DIAGNOSIS — I152 Hypertension secondary to endocrine disorders: Secondary | ICD-10-CM

## 2023-11-17 DIAGNOSIS — E1169 Type 2 diabetes mellitus with other specified complication: Secondary | ICD-10-CM

## 2023-11-17 DIAGNOSIS — I5042 Chronic combined systolic (congestive) and diastolic (congestive) heart failure: Secondary | ICD-10-CM | POA: Diagnosis not present

## 2023-11-17 DIAGNOSIS — E119 Type 2 diabetes mellitus without complications: Secondary | ICD-10-CM | POA: Diagnosis not present

## 2023-11-17 DIAGNOSIS — I2510 Atherosclerotic heart disease of native coronary artery without angina pectoris: Secondary | ICD-10-CM

## 2023-11-17 DIAGNOSIS — Z1211 Encounter for screening for malignant neoplasm of colon: Secondary | ICD-10-CM

## 2023-11-17 DIAGNOSIS — Z7984 Long term (current) use of oral hypoglycemic drugs: Secondary | ICD-10-CM

## 2023-11-17 LAB — GLUCOSE, POCT (MANUAL RESULT ENTRY): POC Glucose: 122 mg/dL — AB (ref 70–99)

## 2023-11-17 LAB — POCT GLYCOSYLATED HEMOGLOBIN (HGB A1C): HbA1c, POC (controlled diabetic range): 8.2 % — AB (ref 0.0–7.0)

## 2023-11-17 MED ORDER — AMLODIPINE BESYLATE 10 MG PO TABS
10.0000 mg | ORAL_TABLET | Freq: Every day | ORAL | 1 refills | Status: DC
  Filled 2023-11-17 – 2023-11-30 (×2): qty 90, 90d supply, fill #0
  Filled 2024-02-24: qty 90, 90d supply, fill #1

## 2023-11-17 MED ORDER — FUROSEMIDE 40 MG PO TABS
40.0000 mg | ORAL_TABLET | Freq: Every day | ORAL | 1 refills | Status: DC
  Filled 2023-11-17 – 2023-11-30 (×2): qty 90, 90d supply, fill #0
  Filled 2024-02-24: qty 90, 90d supply, fill #1

## 2023-11-17 MED ORDER — POTASSIUM CHLORIDE ER 10 MEQ PO TBCR
10.0000 meq | EXTENDED_RELEASE_TABLET | Freq: Two times a day (BID) | ORAL | 1 refills | Status: DC
  Filled 2023-11-17: qty 180, 90d supply, fill #0
  Filled 2024-02-24: qty 180, 90d supply, fill #1

## 2023-11-17 MED ORDER — METOPROLOL SUCCINATE ER 50 MG PO TB24
50.0000 mg | ORAL_TABLET | Freq: Every day | ORAL | 1 refills | Status: DC
  Filled 2023-11-17 – 2023-11-30 (×2): qty 90, 90d supply, fill #0
  Filled 2024-02-24: qty 90, 90d supply, fill #1

## 2023-11-17 MED ORDER — ENTRESTO 97-103 MG PO TABS
1.0000 | ORAL_TABLET | Freq: Two times a day (BID) | ORAL | 1 refills | Status: DC
  Filled 2023-11-17: qty 180, 90d supply, fill #0

## 2023-11-17 MED ORDER — GLIPIZIDE 5 MG PO TABS
5.0000 mg | ORAL_TABLET | Freq: Two times a day (BID) | ORAL | 1 refills | Status: DC
  Filled 2023-11-17 – 2024-01-27 (×2): qty 180, 90d supply, fill #0
  Filled 2024-04-25: qty 180, 90d supply, fill #1

## 2023-11-17 MED ORDER — ATORVASTATIN CALCIUM 80 MG PO TABS
80.0000 mg | ORAL_TABLET | Freq: Every day | ORAL | 1 refills | Status: DC
  Filled 2023-11-17 – 2023-12-23 (×2): qty 90, 90d supply, fill #0
  Filled 2024-03-18: qty 90, 90d supply, fill #1

## 2023-11-17 NOTE — Patient Instructions (Signed)
 VISIT SUMMARY:  Tracey Gallegos, you had a follow-up appointment today to discuss your diabetes, congestive heart failure, and hypertension. We also reviewed your general health maintenance and discussed the impact of recent stress on your health.  YOUR PLAN:  -CONGESTIVE HEART FAILURE: Congestive heart failure means your heart is not pumping blood as well as it should. We will send a prescription for Entresto  to your pharmacy and encourage you to limit your salt intake and use furosemide  consistently.  -HYPERTENSION: Hypertension means high blood pressure. Your blood pressure was elevated today, likely due to not taking Entresto . We will send a prescription for Entresto  and refills for your other medications. Please monitor your blood pressure regularly and limit your salt intake.  -EDEMA: Edema means swelling caused by excess fluid trapped in your body's tissues. This may be related to your medication Amlodipine  and lack of Entresto . We encourage you to use furosemide  consistently and limit your salt intake.  -TYPE 2 DIABETES MELLITUS WITH HYPERGLYCEMIA: Type 2 diabetes with hyperglycemia means your blood sugar levels are too high. Your A1c has increased, and you have not been taking your medications consistently. We encourage you to take glipizide  and Rybelsus  regularly, monitor your blood sugar before breakfast and dinner, and make dietary changes to improve your eating habits.  -GENERAL HEALTH MAINTENANCE: You are due for several routine tests, including cholesterol, kidney, liver function tests, and a urine test for protein. We also discussed using the Cologuard test for colon cancer screening due to transportation issues for a colonoscopy.  INSTRUCTIONS:  Please schedule a follow-up appointment in 4 months. If any problems arise before then, contact us  immediately.

## 2023-11-17 NOTE — Progress Notes (Signed)
 Patient ID: Tracey Gallegos, female    DOB: Sep 23, 1955  MRN: [id number]  CC: Medical Management of Chronic Issues   Subjective: Tracey Gallegos is a 68 y.o. female who presents for chronic ds management. Her concerns today include:  Patient with history of HTN, DM type II, CAD with DES to LT CXM, Diastolic and systolic CHF with EF of 50-55%, moderate pulmonary hypertension on echo 04/2018, morbid obesity, silent lacunar infarcts on MRI 12/2021, cervical DDD, tension headache   Discussed the use of AI scribe software for clinical note transcription with the patient, who gave verbal consent to proceed.  History of Present Illness   Tracey Gallegos is a 68 year old female with diabetes,HTN, CAD and congestive heart failure who presents for follow-up of her chronic conditions.  She has not been seen since July of last year.  Patient states she has had a lot going on in the past 8 months.  She has had several deaths in the family including that of her brother and her mother.  Mother died last month of leukemia.  She was going back and forth to [place]  to help with her care.  DM: Results for orders placed or performed in visit on 11/17/23  POCT glucose (manual entry)   Collection Time: 11/17/23  2:15 PM  Result Value Ref Range   POC Glucose 122 (A) 70 - 99 mg/dl  POCT glycosylated hemoglobin (Hb A1C)   Collection Time: 11/17/23  2:20 PM  Result Value Ref Range   Hemoglobin A1C     HbA1c POC (<> result, manual entry)     HbA1c, POC (prediabetic range)     HbA1c, POC (controlled diabetic range) 8.2 (A) 0.0 - 7.0 %   She is prescribed glipizide  5 mg twice daily and Rybelsus  7 mg but has not been taking them consistently due to increased life stresses over the past several months.  Eating habits have not been the best for the same reason.  Rybelsus  did not decrease appetite much initially but she feels that the appetite has decreased some over the past month.  She has not been checking her blood  sugars regularly after forgetting her device during a trip.  She has been getting back on track over the past few weeks with taking medications and trying to eat better..  CHF/CAD/HTN: She should be on furosemide  40 mg, atorvastatin  80 mg, aspirin , metoprolol  XL 50 mg daily, Entresto  97/103 mg daily and amlodipine  10 mg daily. She has been out of Entresto  for about one and a half months due to cost issues.  She needs to get signed up for the patient assistance program again.  She experiences leg swelling but no chest pain or shortness of breath. She is mindful of her salt intake but does not check her blood pressure at home due to lack of a device.  HM: Due for colon cancer screening.  Last colonoscopy was 10 years ago.  Reports no polyps were removed.    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/19/2020   Dyslipidemia, goal LDL below 70 12/09/2019   Papanicolaou smear of cervix with low risk human papillomavirus (HPV) DNA test positive 01/21/2019   Influenza vaccination declined 07/20/2018   Chronic combined systolic and diastolic CHF (congestive heart failure) (HCC) 07/20/2018   Positive depression screening 07/20/2018   CAD (coronary artery disease)  07/09/2018   Type 2 diabetes mellitus without complication, without long-term current use of insulin  (HCC) 06/24/2018   Morbid obesity (HCC)      Current Outpatient Medications on File Prior to Visit  Medication Sig Dispense Refill   Accu-Chek Softclix Lancets lancets Use to check blood sugar daily. E11.9 100 each 2   aspirin  (ASPIRIN  81) 81 MG EC tablet Take 1 tablet (81 mg total) by mouth daily. 100 tablet 2   Blood Glucose Monitoring Suppl (ACCU-CHEK GUIDE ME) w/Device KIT Use to check blood sugar daily. E11.9 1 kit 0   butalbital -acetaminophen -caffeine  (FIORICET ) 50-325-40 MG tablet Take 1-2  tablets by mouth 2 (two) times daily as needed for headache. 20 tablet 0   diclofenac  Sodium (VOLTAREN ) 1 % GEL Apply 2 g topically 4 (four) times daily. 100 g 1   glucose blood (ACCU-CHEK GUIDE) test strip USE TO CHECK BLOOD SUGAR DAILY. 100 strip 2   Multiple Vitamins-Minerals (MULTIVITAMIN WITH MINERALS) tablet Take 1 tablet by mouth daily.     Omega-3 Fatty Acids (FISH OIL PO) Take 1,000 mg by mouth daily.      Semaglutide  (RYBELSUS ) 7 MG TABS Take 1 tablet (7 mg total) by mouth daily. 30 tablet 6   No current facility-administered medications on file prior to visit.    Allergies  Allergen Reactions   Lisinopril  Cough    Social History   Socioeconomic History   Marital status: Single    Spouse name: Not on file   Number of children: 0   Years of education: Not on file   Highest education level: Master's degree (e.g., MA, MS, MEng, MEd, MSW, MBA)  Occupational History   Occupation: retired  Tobacco Use   Smoking status: Never   Smokeless tobacco: Never  Vaping Use   Vaping status: Never Used  Substance and Sexual Activity   Alcohol use: Yes    Comment: occasionally   Drug use: Never   Sexual activity: Not Currently  Other Topics Concern   Not on file  Social History Narrative   Right handed   Caffeine  Intake 1 cup of coffee occassionally   Social Drivers of Corporate investment banker Strain: Low Risk  (11/17/2023)   Overall Financial Resource Strain (CARDIA)    Difficulty of Paying Living Expenses: Not hard at all  Food Insecurity: No Food Insecurity (11/17/2023)   Hunger Vital Sign    Worried About Running Out of Food in the Last Year: Never true    Ran Out of Food in the Last Year: Never true  Transportation Needs: No Transportation Needs (11/17/2023)   PRAPARE - Administrator, Civil Service (Medical): No    Lack of Transportation (Non-Medical): No  Physical Activity: Insufficiently Active (11/17/2023)   Exercise Vital Sign    Days of Exercise per  Week: 1 day    Minutes of Exercise per Session: 20 min  Stress: No Stress Concern Present (11/17/2023)   [employer] of Occupational Health - Occupational Stress Questionnaire    Feeling of Stress : Only a little  Social Connections: Moderately Isolated (11/17/2023)   Social Connection and Isolation Panel [NHANES]    Frequency of Communication with Friends and Family: Twice a week    Frequency of Social Gatherings with Friends and Family: Once a week    Attends Religious Services: 1 to 4 times per year    Active Member of [employer] or Organizations: No    Attends Banker Meetings: Never  Marital Status: Never married  Intimate Partner Violence: Not At Risk (02/24/2023)   Humiliation, Afraid, Rape, and Kick questionnaire    Fear of Current or Ex-Partner: No    Emotionally Abused: No    Physically Abused: No    Sexually Abused: No    Family History  Problem Relation Age of Onset   Diabetes Mother    Leukemia Mother    Colon cancer Brother     Past Surgical History:  Procedure Laterality Date   CARDIAC CATHETERIZATION  06/28/2018   CORONARY STENT INTERVENTION N/A 06/28/2018   Procedure: CORONARY STENT INTERVENTION;  Surgeon: Arleen Lacer, MD;  Location: [facility] INVASIVE CV LAB;  Service: Cardiovascular;  Laterality: N/A;   CORONARY STENT PLACEMENT  06/28/2018   RIGHT/LEFT HEART CATH AND CORONARY ANGIOGRAPHY N/A 06/28/2018   Procedure: RIGHT/LEFT HEART CATH AND CORONARY ANGIOGRAPHY;  Surgeon: Arleen Lacer, MD;  Location: [facility] INVASIVE CV LAB;  Service: Cardiovascular;  Laterality: N/A;   TOTAL KNEE ARTHROPLASTY Left 05/2016    ROS: Review of Systems Negative except as stated above  PHYSICAL EXAM: BP (!) 141/81   Pulse 78   Ht 5\' 8"  (1.727 m)   Wt 296 lb 6.4 oz (134.4 kg)   SpO2 97%   BMI 45.07 kg/m   Wt Readings from Last 3 Encounters:  11/17/23 296 lb 6.4 oz (134.4 kg)  02/24/23 289 lb (131.1 kg)  01/26/23 289 lb (131.1 kg)    Physical Exam  General  appearance - alert, well appearing, older AAF, obese and in no distress Mental status - normal mood, behavior, speech, dress, motor activity, and thought processes Eyes - pupils equal and reactive, extraocular eye movements intact Neck - supple, no significant adenopathy Chest - clear to auscultation, no wheezes, rales or rhonchi, symmetric air entry Heart - normal rate, regular rhythm, normal S1, S2, no murmurs, rubs, clicks or gallops Extremities -       Latest Ref Rng & Units 04/25/2022    2:26 PM 04/22/2022   10:54 AM 12/20/2021    4:43 PM  CMP  Glucose 70 - 99 mg/dL 657   846   BUN 8 - 27 mg/dL 15   16   Creatinine 9.62 - 1.00 mg/dL 9.52   8.41   Sodium 324 - 144 mmol/L 143   144   Potassium 3.5 - 5.2 mmol/L 4.7   4.9   Chloride 96 - 106 mmol/L 103   103   CO2 20 - 29 mmol/L 25   24   Calcium  8.7 - 10.3 mg/dL 40.1   9.9   Total Protein 6.0 - 8.5 g/dL  6.9  7.6   Total Bilirubin 0.0 - 1.2 mg/dL  0.5  0.5   Alkaline Phos 44 - 121 IU/L  121  115   AST 0 - 40 IU/L  25  26   ALT 0 - 32 IU/L  30  18    Lipid Panel     Component Value Date/Time   CHOL 157 04/22/2022 1054   TRIG 99 04/22/2022 1054   HDL 48 04/22/2022 1054   CHOLHDL 3.3 04/22/2022 1054   CHOLHDL 5.5 04/27/2018 1513   VLDL 17 04/27/2018 1513   LDLCALC 91 04/22/2022 1054    CBC    Component Value Date/Time   WBC 5.4 12/20/2021 1643   WBC 5.4 06/30/2018 0401   RBC 5.25 12/20/2021 1643   RBC 5.42 (H) 06/30/2018 0401   HGB 14.8 12/20/2021 1643   HCT 44.5  12/20/2021 1643   PLT 238 12/20/2021 1643   MCV 85 12/20/2021 1643   MCH 28.2 12/20/2021 1643   MCH 24.7 (L) 06/30/2018 0401   MCHC 33.3 12/20/2021 1643   MCHC 30.3 06/30/2018 0401   RDW 14.0 12/20/2021 1643   LYMPHSABS 1.7 07/09/2018 0937   MONOABS 0.5 06/24/2018 1305   EOSABS 0.6 (H) 07/09/2018 0937   BASOSABS 0.1 07/09/2018 0937    ASSESSMENT AND PLAN: 1. Type 2 diabetes mellitus with morbid obesity (HCC) (Primary) Not at goal.  Patient has had  some life stressors over the past 8 months that has affected her adherence to dietary recommendations and medications.  We decided not to make any changes in medications today.  She will continue the Rybelsus  and Glucotrol  and will start taking consistently.  She also set a goal to get back on track with her eating habits. - POCT glycosylated hemoglobin (Hb A1C) - POCT glucose (manual entry) - CBC - Comprehensive metabolic panel with GFR - Microalbumin / creatinine urine ratio - glipiZIDE  (GLUCOTROL ) 5 MG tablet; Take 1 tablet (5 mg total) by mouth 2 (two) times daily before a meal.  Dispense: 180 tablet; Refill: 1  2. Diabetes mellitus treated with oral medication (HCC) See #1 above  3. Hypertension associated with type 2 diabetes mellitus (HCC) Not at goal but repeat blood pressure better.  She has been out of Entresto .  Refill sent on Entresto .  Continue amlodipine  10 mg daily, Toprol -XL 50 mg daily - metoprolol  succinate (TOPROL -XL) 50 MG 24 hr tablet; Take 1 tablet (50 mg total) by mouth daily.  Dispense: 90 tablet; Refill: 1 - amLODipine  (NORVASC ) 10 MG tablet; Take 1 tablet (10 mg total) by mouth daily.  Dispense: 90 tablet; Refill: 1  4. Coronary artery disease involving native coronary artery of native heart without angina pectoris Stable.  Continue metoprolol , atorvastatin  80 mg daily, aspirin  81 mg daily - Lipid panel - metoprolol  succinate (TOPROL -XL) 50 MG 24 hr tablet; Take 1 tablet (50 mg total) by mouth daily.  Dispense: 90 tablet; Refill: 1  5. Chronic combined systolic and diastolic CHF (congestive heart failure) (HCC) Stable and compensated with some lower extremity edema for which amlodipine  may be contributing.  Refill sent on Entresto .  Continue metoprolol , furosemide , potassium. - sacubitril -valsartan  (ENTRESTO ) 97-103 MG; Take 1 tablet by mouth 2 (two) times daily.  Dispense: 180 tablet; Refill: 1 - metoprolol  succinate (TOPROL -XL) 50 MG 24 hr tablet; Take 1 tablet (50  mg total) by mouth daily.  Dispense: 90 tablet; Refill: 1 - furosemide  (LASIX ) 40 MG tablet; Take 1 tablet (40 mg total) by mouth daily.  Dispense: 90 tablet; Refill: 1 - potassium chloride  (KLOR-CON ) 10 MEQ tablet; Take 1 tablet (10 mEq total) by mouth 2 (two) times daily.  Dispense: 180 tablet; Refill: 1  6. Screening for colon cancer We discussed colon cancer screening methods.  She prefers to go with Cologuard test instead of colonoscopy at this time. - Cologuard    Patient was given the opportunity to ask questions.  Patient verbalized understanding of the plan and was able to repeat key elements of the plan.   This documentation was completed using Paediatric nurse.  Any transcriptional errors are unintentional.  Orders Placed This Encounter  Procedures   CBC   Comprehensive metabolic panel with GFR   Lipid panel   Microalbumin / creatinine urine ratio   Cologuard   POCT glycosylated hemoglobin (Hb A1C)   POCT glucose (manual entry)  Requested Prescriptions   Signed Prescriptions Disp Refills   sacubitril -valsartan  (ENTRESTO ) 97-103 MG 180 tablet 1    Sig: Take 1 tablet by mouth 2 (two) times daily.   metoprolol  succinate (TOPROL -XL) 50 MG 24 hr tablet 90 tablet 1    Sig: Take 1 tablet (50 mg total) by mouth daily.   furosemide  (LASIX ) 40 MG tablet 90 tablet 1    Sig: Take 1 tablet (40 mg total) by mouth daily.   atorvastatin  (LIPITOR) 80 MG tablet 90 tablet 1    Sig: Take 1 tablet (80 mg total) by mouth daily.   amLODipine  (NORVASC ) 10 MG tablet 90 tablet 1    Sig: Take 1 tablet (10 mg total) by mouth daily.   potassium chloride  (KLOR-CON ) 10 MEQ tablet 180 tablet 1    Sig: Take 1 tablet (10 mEq total) by mouth 2 (two) times daily.   glipiZIDE  (GLUCOTROL ) 5 MG tablet 180 tablet 1    Sig: Take 1 tablet (5 mg total) by mouth 2 (two) times daily before a meal.    Return in about 4 months (around 03/18/2024).  Concetta Dee, MD, FACP

## 2023-11-18 LAB — CBC
Hematocrit: 45.7 % (ref 34.0–46.6)
Hemoglobin: 15 g/dL (ref 11.1–15.9)
MCH: 27.8 pg (ref 26.6–33.0)
MCHC: 32.8 g/dL (ref 31.5–35.7)
MCV: 85 fL (ref 79–97)
Platelets: 249 10*3/uL (ref 150–450)
RBC: 5.39 x10E6/uL — ABNORMAL HIGH (ref 3.77–5.28)
RDW: 13.8 % (ref 11.7–15.4)
WBC: 6.5 10*3/uL (ref 3.4–10.8)

## 2023-11-18 LAB — LIPID PANEL
Chol/HDL Ratio: 2.9 ratio (ref 0.0–4.4)
Cholesterol, Total: 168 mg/dL (ref 100–199)
HDL: 57 mg/dL (ref >39)
LDL Chol Calc (NIH): 94 mg/dL (ref 0–99)
Triglycerides: 92 mg/dL (ref 0–149)
VLDL Cholesterol Cal: 17 mg/dL (ref 5–40)

## 2023-11-18 LAB — COMPREHENSIVE METABOLIC PANEL WITH GFR
ALT: 21 IU/L (ref 0–32)
AST: 31 IU/L (ref 0–40)
Albumin: 4.6 g/dL (ref 3.9–4.9)
Alkaline Phosphatase: 127 IU/L — ABNORMAL HIGH (ref 44–121)
BUN/Creatinine Ratio: 15 (ref 12–28)
BUN: 17 mg/dL (ref 8–27)
Bilirubin Total: 0.6 mg/dL (ref 0.0–1.2)
CO2: 23 mmol/L (ref 20–29)
Calcium: 10 mg/dL (ref 8.7–10.3)
Chloride: 101 mmol/L (ref 96–106)
Creatinine, Ser: 1.17 mg/dL — ABNORMAL HIGH (ref 0.57–1.00)
Globulin, Total: 3.3 g/dL (ref 1.5–4.5)
Glucose: 115 mg/dL — ABNORMAL HIGH (ref 70–99)
Potassium: 4.3 mmol/L (ref 3.5–5.2)
Sodium: 142 mmol/L (ref 134–144)
Total Protein: 7.9 g/dL (ref 6.0–8.5)
eGFR: 51 mL/min/{1.73_m2} — ABNORMAL LOW (ref >59)

## 2023-11-18 LAB — MICROALBUMIN / CREATININE URINE RATIO
Creatinine, Urine: 241.3 mg/dL
Microalb/Creat Ratio: 6 mg/g{creat} (ref 0–29)
Microalbumin, Urine: 15.2 ug/mL

## 2023-11-18 NOTE — Progress Notes (Signed)
 Blood cell counts are normal. Kidney function not at 100% and has declined from when it was last checked in October 2023.  We will need to continue to monitor this over time.  Avoid taking over-the-counter pain medications like Aleve, Advil , Motrin , Naprosyn and ibuprofen  as these can make kidney function worse.  Okay to use Tylenol .  Try to drink at least 4 to 8 glasses of water daily. LDL cholesterol level is 94 with goal being less than 55 in patients with known history of heart disease.  Please make sure that Tracey Gallegos takes the atorvastatin  consistently every day.

## 2023-11-25 ENCOUNTER — Other Ambulatory Visit: Payer: Self-pay

## 2023-11-26 ENCOUNTER — Other Ambulatory Visit: Payer: Self-pay

## 2023-11-30 ENCOUNTER — Other Ambulatory Visit: Payer: Self-pay

## 2023-12-01 ENCOUNTER — Other Ambulatory Visit: Payer: Self-pay

## 2023-12-24 ENCOUNTER — Other Ambulatory Visit: Payer: Self-pay

## 2023-12-25 ENCOUNTER — Other Ambulatory Visit: Payer: Self-pay

## 2023-12-28 ENCOUNTER — Other Ambulatory Visit (HOSPITAL_BASED_OUTPATIENT_CLINIC_OR_DEPARTMENT_OTHER): Payer: Self-pay | Admitting: Pharmacist

## 2023-12-28 ENCOUNTER — Other Ambulatory Visit: Payer: Self-pay

## 2023-12-28 DIAGNOSIS — Z7984 Long term (current) use of oral hypoglycemic drugs: Secondary | ICD-10-CM

## 2023-12-28 DIAGNOSIS — Z7985 Long-term (current) use of injectable non-insulin antidiabetic drugs: Secondary | ICD-10-CM

## 2023-12-28 DIAGNOSIS — E1169 Type 2 diabetes mellitus with other specified complication: Secondary | ICD-10-CM

## 2023-12-28 MED ORDER — RYBELSUS 7 MG PO TABS
7.0000 mg | ORAL_TABLET | Freq: Every day | ORAL | 6 refills | Status: DC
  Filled 2023-12-28 (×2): qty 30, 30d supply, fill #0
  Filled 2024-01-27: qty 30, 30d supply, fill #1
  Filled 2024-02-24: qty 60, 60d supply, fill #2
  Filled 2024-04-25: qty 90, 90d supply, fill #3

## 2023-12-28 NOTE — Progress Notes (Signed)
 Pharmacy TNM Diabetes Measure Review  S:  Patient was identified in a report as being at risk for failing the True [place] Metric of A1c control (<8%).. Last A1c was 8.2. Last PCP visit was 11/17/2023.  Call placed to patient to discuss diabetes control and medication management. Patient has been seen by the clinical pharmacist with most recent visit 04/22/2022.  Current diabetes medications include: Rybelsus  7 mg daily, glipizide  5mg  BID Patient reports adherence to taking all medications as prescribed. Dispense record looks good. She is due for Rybelsus  refills on 01/01/2024.   Insurance coverage: [employer]  Patient denies hypoglycemic events.  O:  Lab Results  Component Value Date   HGBA1C 8.2 (A) 11/17/2023   There were no vitals filed for this visit.  Lipid Panel     Component Value Date/Time   CHOL 168 11/17/2023 1453   TRIG 92 11/17/2023 1453   HDL 57 11/17/2023 1453   CHOLHDL 2.9 11/17/2023 1453   CHOLHDL 5.5 04/27/2018 1513   VLDL 17 04/27/2018 1513   LDLCALC 94 11/17/2023 1453    Clinical Atherosclerotic Cardiovascular Disease (ASCVD): Yes  The ASCVD Risk score (Arnett DK, et al., 2019) failed to calculate for the following reasons:   Risk score cannot be calculated because patient has a medical history suggesting prior/existing ASCVD   Patient is participating in a Managed Medicaid Plan: No   A/P: Diabetes longstanding currently above goal. Patient is able to verbalize appropriate hypoglycemia management plan. Medication adherence appears appropriate. She is due for Rybelsus  refills at the ned of this week. -Continued current regimen. -Refills for Rybelsus  sent. -Extensively discussed pathophysiology of diabetes, recommended lifestyle interventions, dietary effects on blood sugar control.  -Counseled on s/sx of and management of hypoglycemia.  -Next A1c anticipated 01/2024.   Follow-up:  Pharmacist 02/01/2024.  Marene Shape, PharmD, Becky Bowels, CPP Clinical  Pharmacist [facility] [phone]

## 2023-12-31 ENCOUNTER — Other Ambulatory Visit: Payer: Self-pay

## 2024-01-04 ENCOUNTER — Other Ambulatory Visit: Payer: Self-pay

## 2024-01-28 ENCOUNTER — Other Ambulatory Visit: Payer: Self-pay

## 2024-01-29 ENCOUNTER — Other Ambulatory Visit: Payer: Self-pay

## 2024-02-01 ENCOUNTER — Ambulatory Visit: Attending: Internal Medicine | Admitting: Pharmacist

## 2024-02-01 ENCOUNTER — Encounter: Payer: Self-pay | Admitting: Pharmacist

## 2024-02-01 DIAGNOSIS — Z7984 Long term (current) use of oral hypoglycemic drugs: Secondary | ICD-10-CM

## 2024-02-01 DIAGNOSIS — E119 Type 2 diabetes mellitus without complications: Secondary | ICD-10-CM

## 2024-02-01 LAB — POCT GLYCOSYLATED HEMOGLOBIN (HGB A1C): HbA1c, POC (controlled diabetic range): 7.6 % — AB (ref 0.0–7.0)

## 2024-02-01 NOTE — Progress Notes (Signed)
 Pharmacy TNM Diabetes Measure Review  S:  Patient was identified in a report as being at risk for failing the True [place] Metric of A1c control (<8%). Last A1c was 8.2 back in April of this year. Last PCP visit was 4/29/202 and she has an upcoming appt next month with Dr. Vicci.  Today, pt presents in good spirits. She is here today for an updated A1c reading. She has no complaints today.   Current diabetes medications include: Rybelsus  7 mg daily, glipizide  5mg  BID Patient reports adherence to taking all medications as prescribed.  Insurance coverage: [employer]  Patient denies hypoglycemic events.  Patient denies any polyuria.  Patient denies any changes in vision. Patient denies any S/sx of neuropathy.   Patient-reported dietary:  -Can tell a decreased appetite with the PO semaglutide .  Patient-reported exercise habits:  -Does 1/2 hour daily walking, stretching   O:  Lab Results  Component Value Date   HGBA1C 8.2 (A) 11/17/2023   There were no vitals filed for this visit.  Lipid Panel     Component Value Date/Time   CHOL 168 11/17/2023 1453   TRIG 92 11/17/2023 1453   HDL 57 11/17/2023 1453   CHOLHDL 2.9 11/17/2023 1453   CHOLHDL 5.5 04/27/2018 1513   VLDL 17 04/27/2018 1513   LDLCALC 94 11/17/2023 1453    Clinical Atherosclerotic Cardiovascular Disease (ASCVD): Yes  The ASCVD Risk score (Arnett DK, et al., 2019) failed to calculate for the following reasons:   Risk score cannot be calculated because patient has a medical history suggesting prior/existing ASCVD   Patient is participating in a Managed Medicaid Plan: No   A/P: Diabetes longstanding currently close to goal. She is now passing the TNM as her A1c is <8%. I commended her for this! However, we would have her A1c goal of <7%. I discussed increasing the rybelsus  dose but she prefers to continue with the 7mg  along with her current dose of glipizide . She is not symptomatic at this time. Patient is able to  verbalize appropriate hypoglycemia management plan. Medication adherence appears appropriate.  -Continued current regimen. -Extensively discussed pathophysiology of diabetes, recommended lifestyle interventions, dietary effects on blood sugar control.  -Counseled on s/sx of and management of hypoglycemia.  -Next A1c anticipated 04/2024.   Follow-up:  PCP: 03/18/2024.  Tracey Gallegos, PharmD, JAQUELINE, CPP Clinical Pharmacist [facility] [phone]

## 2024-02-25 ENCOUNTER — Other Ambulatory Visit: Payer: Self-pay

## 2024-02-26 ENCOUNTER — Other Ambulatory Visit: Payer: Self-pay

## 2024-03-05 IMAGING — MR MR HEAD WO/W CM
12 series · 48 of 48 positions shown · IV contrast (20 ML MULTIHANCE)
Comparison: No pertinent prior exams available for comparison.

CLINICAL DATA: Provided history: Headache syndrome. Headache, new
or worsening.

EXAM:
MRI HEAD WITHOUT AND WITH CONTRAST
TECHNIQUE: Multiplanar, multiecho pulse sequences of the brain and surrounding
structures were obtained without and with intravenous contrast.
CONTRAST:  20mL MULTIHANCE GADOBENATE DIMEGLUMINE 529 MG/ML IV SOLN

[Series 2: T1 · sagittal · 5.0mm · 0.45mm/px · 1 of 25 slices shown]
[im 1/25]
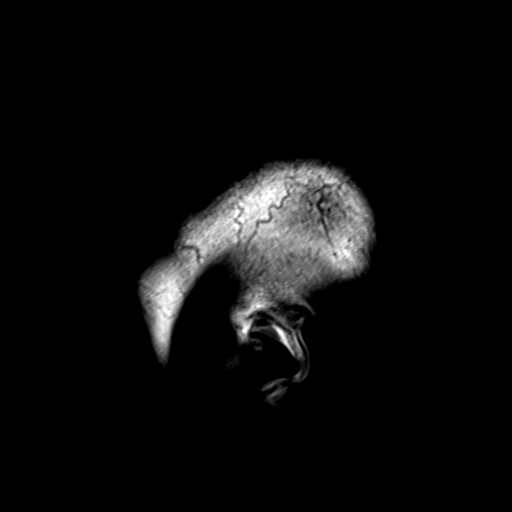

[Series 3: ax ep2d_diff_3 · axial · 3.0mm · 1.80mm/px · z∈[-69,+92]mm · 5 of 108 slices shown]
[im 1/108]
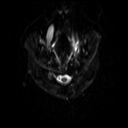
[im 27/108]
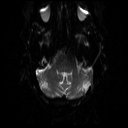
[im 54/108]
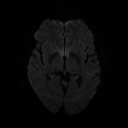
[im 81/108]
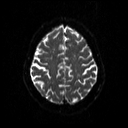
[im 108/108]
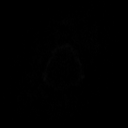

[Series 4: ax ep2d_diff_3_adc · axial · 3.0mm · 1.80mm/px · z∈[-69,+92]mm · 3 of 55 slices shown]
[im 1/55]
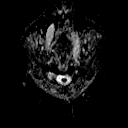
[im 28/55]
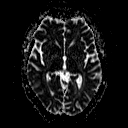
[im 55/55]
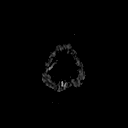

[Series 5: cor ep2d_diff · coronal · 5.0mm · 1.77mm/px · 4 of 60 slices shown]
[im 1/60]
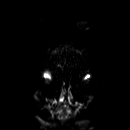
[im 20/60]
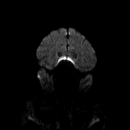
[im 40/60]
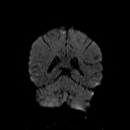
[im 60/60]
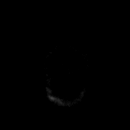

[Series 6: cor ep2d_diff_adc · coronal · 5.0mm · 1.77mm/px · 2 of 30 slices shown]
[im 1/30]
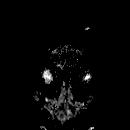
[im 30/30]
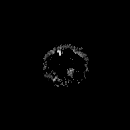

[Series 8: swi_images · axial · 2.0mm · 0.98mm/px · z∈[-59,+98]mm · 5 of 80 slices shown]
[im 1/80]
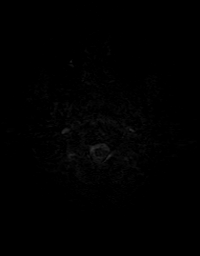
[im 20/80]
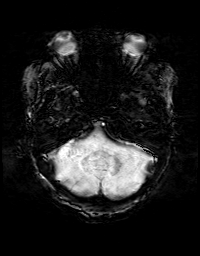
[im 40/80]
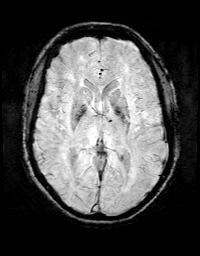
[im 60/80]
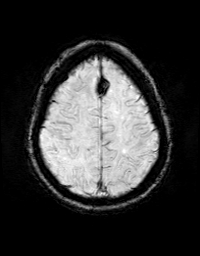
[im 80/80]
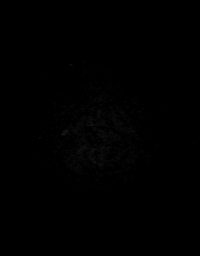

[Series 9: FLAIR · axial · 3.0mm · 0.43mm/px · z∈[-58,+94]mm · 2 of 40 slices shown]
[im 1/40]
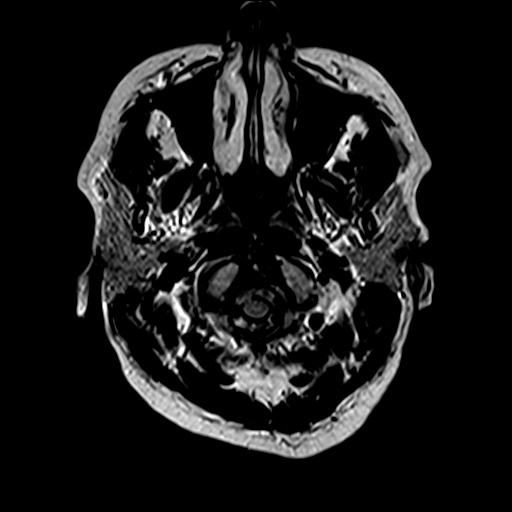
[im 40/40]
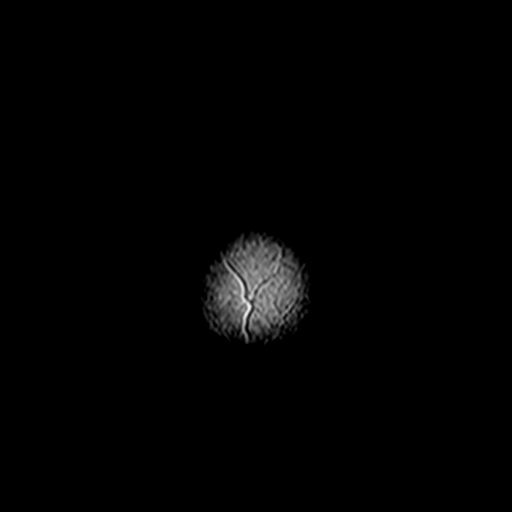

[Series 10: T2 · axial · 5.0mm · 0.65mm/px · z∈[-64,+103]mm · 2 of 29 slices shown (1 of 2)]
[im 1/29]
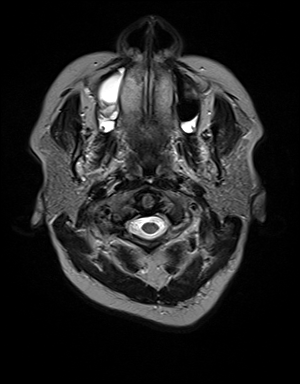
[im 29/29]
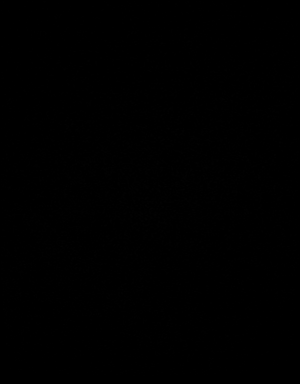

[Series 11: t1_mpr_tra · axial · 1.0mm · 0.72mm/px · z∈[-61,+97]mm · 10 of 160 slices shown]
[im 1/160]
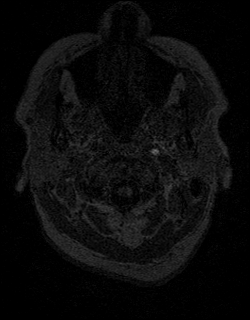
[im 18/160]
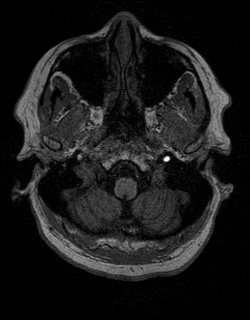
[im 36/160]
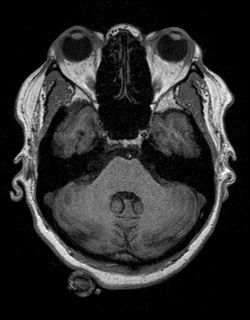
[im 54/160]
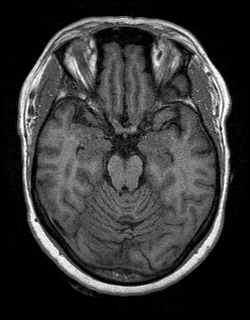
[im 71/160]
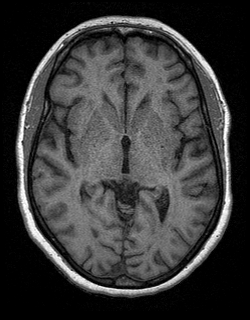
[im 89/160]
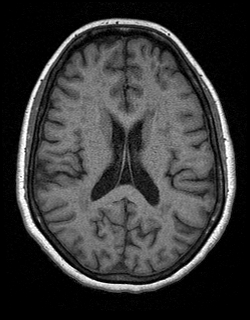
[im 107/160]
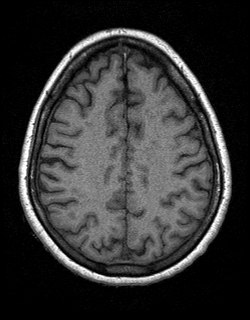
[im 124/160]
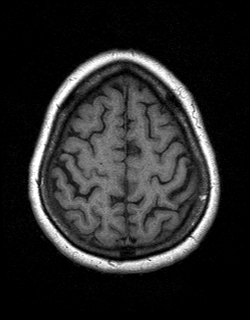
[im 142/160]
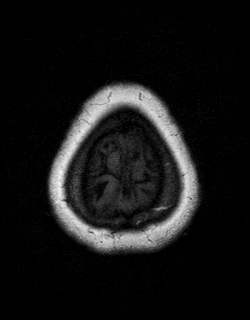
[im 160/160]
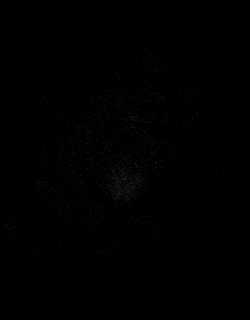

[Series 12: T2 · coronal · 5.0mm · 0.43mm/px · 2 of 28 slices shown (2 of 2)]
[im 1/28]
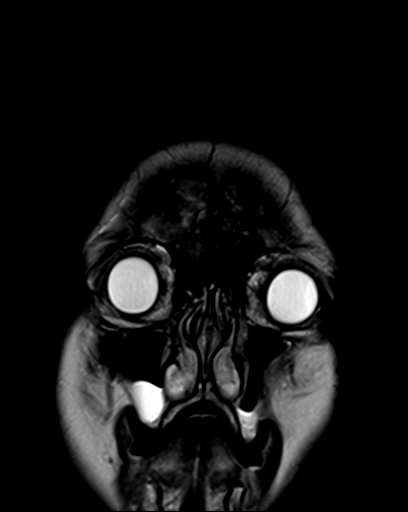
[im 28/28]
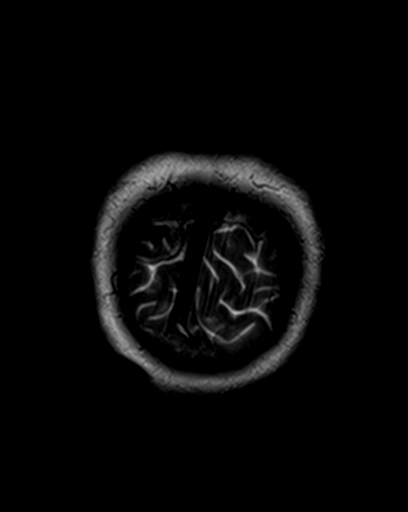

[Series 13: post t1_mpr_tra · axial · 1.0mm · 0.72mm/px · z∈[-61,+97]mm · 10 of 160 slices shown]
[im 1/160]
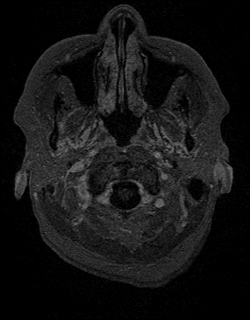
[im 18/160]
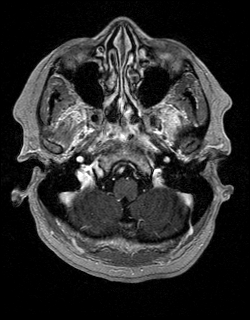
[im 36/160]
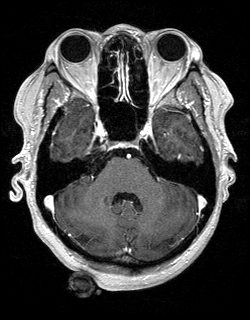
[im 54/160]
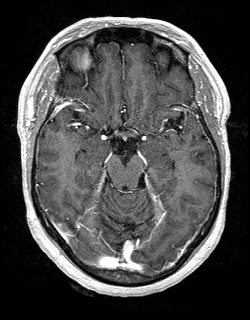
[im 71/160]
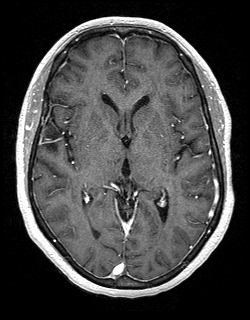
[im 89/160]
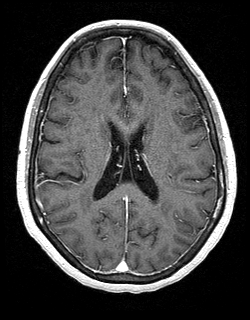
[im 107/160]
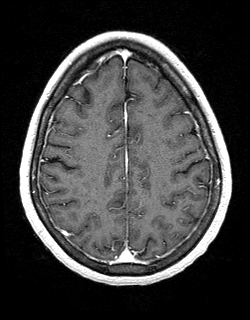
[im 124/160]
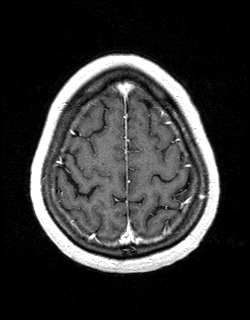
[im 142/160]
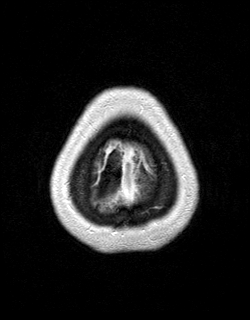
[im 160/160]
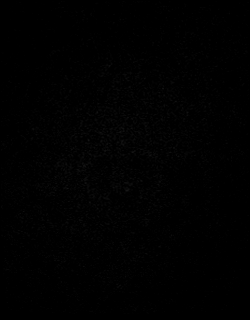

[Series 14: T1 post-contrast · coronal · 5.0mm · 0.72mm/px · 2 of 28 slices shown]
[im 1/28]
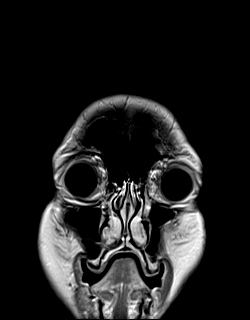
[im 28/28]
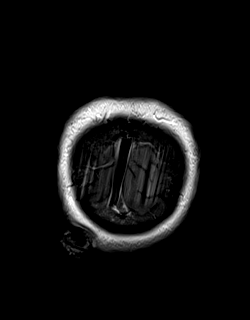

[48 of 48 positions shown; findings below may reference images not displayed]

FINDINGS: Brain:

Mild generalized cerebral atrophy.

Multifocal T2 FLAIR hyperintense signal abnormality within the
cerebral white matter, nonspecific but compatible with moderate
chronic small vessel ischemic disease.

Chronic lacunar infarcts within the right caudate and bilateral
lentiform nuclei.

There are a few supratentorial and infratentorial chronic
microhemorrhages with a central and posterior fossa predominance,
likely reflecting sequela of chronic hypertensive microangiopathy.

There is no acute infarct.

No evidence of an intracranial mass.

No extra-axial fluid collection.

No midline shift.

No pathologic intracranial enhancement identified.

Vascular: Maintained flow voids within the proximal large arterial
vessels.

Skull and upper cervical spine: No focal suspicious marrow lesion.

Sinuses/Orbits: No mass or acute finding within the imaged orbits.
Moderate mucosal thickening along the floor of the right maxillary
sinus. Mild mucosal thickening along the floor of the left maxillary
sinus.
IMPRESSION: 1. No evidence of acute intracranial abnormality.
2. Moderate chronic small vessel ischemic changes within the
cerebral white matter.
3. Chronic lacunar infarcts within the bilateral basal ganglia.
4. Few supratentorial and infratentorial chronic parenchymal
microhemorrhages in a distribution suggesting sequela of chronic
hypertensive microangiopathy.
5. Mild generalized cerebral atrophy.
6. Mucosal thickening within the bilateral maxillary sinuses
(moderate right, mild left).

## 2024-03-18 ENCOUNTER — Ambulatory Visit: Admitting: Internal Medicine

## 2024-03-23 ENCOUNTER — Other Ambulatory Visit: Payer: Self-pay

## 2024-04-15 ENCOUNTER — Ambulatory Visit: Admitting: Internal Medicine

## 2024-04-15 ENCOUNTER — Other Ambulatory Visit: Payer: Self-pay

## 2024-04-25 ENCOUNTER — Other Ambulatory Visit: Payer: Self-pay

## 2024-04-26 ENCOUNTER — Other Ambulatory Visit: Payer: Self-pay

## 2024-04-27 ENCOUNTER — Other Ambulatory Visit: Payer: Self-pay | Admitting: Internal Medicine

## 2024-04-27 DIAGNOSIS — Z1231 Encounter for screening mammogram for malignant neoplasm of breast: Secondary | ICD-10-CM

## 2024-05-05 ENCOUNTER — Encounter: Admitting: Internal Medicine

## 2024-05-13 ENCOUNTER — Ambulatory Visit
Admission: RE | Admit: 2024-05-13 | Discharge: 2024-05-13 | Disposition: A | Discharge status: Home / Self Care | Source: Ambulatory Visit | Attending: Internal Medicine | Admitting: Internal Medicine

## 2024-05-13 DIAGNOSIS — Z1231 Encounter for screening mammogram for malignant neoplasm of breast: Secondary | ICD-10-CM

## 2024-05-17 ENCOUNTER — Encounter: Payer: Self-pay | Admitting: Internal Medicine

## 2024-05-17 ENCOUNTER — Other Ambulatory Visit: Payer: Self-pay

## 2024-05-17 ENCOUNTER — Ambulatory Visit: Attending: Internal Medicine | Admitting: Internal Medicine

## 2024-05-17 DIAGNOSIS — Z6841 Body Mass Index (BMI) 40.0 and over, adult: Secondary | ICD-10-CM | POA: Diagnosis not present

## 2024-05-17 DIAGNOSIS — E119 Type 2 diabetes mellitus without complications: Secondary | ICD-10-CM

## 2024-05-17 DIAGNOSIS — N289 Disorder of kidney and ureter, unspecified: Secondary | ICD-10-CM | POA: Diagnosis not present

## 2024-05-17 DIAGNOSIS — E1159 Type 2 diabetes mellitus with other circulatory complications: Secondary | ICD-10-CM

## 2024-05-17 DIAGNOSIS — I2510 Atherosclerotic heart disease of native coronary artery without angina pectoris: Secondary | ICD-10-CM

## 2024-05-17 DIAGNOSIS — Z2821 Immunization not carried out because of patient refusal: Secondary | ICD-10-CM | POA: Diagnosis not present

## 2024-05-17 DIAGNOSIS — I5042 Chronic combined systolic (congestive) and diastolic (congestive) heart failure: Secondary | ICD-10-CM | POA: Diagnosis not present

## 2024-05-17 DIAGNOSIS — E1169 Type 2 diabetes mellitus with other specified complication: Secondary | ICD-10-CM

## 2024-05-17 DIAGNOSIS — Z1211 Encounter for screening for malignant neoplasm of colon: Secondary | ICD-10-CM

## 2024-05-17 DIAGNOSIS — I110 Hypertensive heart disease with heart failure: Secondary | ICD-10-CM

## 2024-05-17 DIAGNOSIS — Z7984 Long term (current) use of oral hypoglycemic drugs: Secondary | ICD-10-CM

## 2024-05-17 LAB — GLUCOSE, POCT (MANUAL RESULT ENTRY): POC Glucose: 228 mg/dL — AB (ref 70–99)

## 2024-05-17 LAB — POCT GLYCOSYLATED HEMOGLOBIN (HGB A1C): HbA1c, POC (controlled diabetic range): 8 % — AB (ref 0.0–7.0)

## 2024-05-17 MED ORDER — AMLODIPINE BESYLATE 10 MG PO TABS
10.0000 mg | ORAL_TABLET | Freq: Every day | ORAL | 1 refills | Status: AC
  Filled 2024-05-17: qty 90, 90d supply, fill #0
  Filled 2024-08-25: qty 90, 90d supply, fill #1

## 2024-05-17 MED ORDER — METOPROLOL SUCCINATE ER 50 MG PO TB24
50.0000 mg | ORAL_TABLET | Freq: Every day | ORAL | 1 refills | Status: AC
  Filled 2024-05-17: qty 90, 90d supply, fill #0
  Filled 2024-08-25: qty 90, 90d supply, fill #1

## 2024-05-17 MED ORDER — ATORVASTATIN CALCIUM 80 MG PO TABS
80.0000 mg | ORAL_TABLET | Freq: Every day | ORAL | 1 refills | Status: AC
  Filled 2024-05-17 – 2024-06-20 (×2): qty 90, 90d supply, fill #0

## 2024-05-17 MED ORDER — POTASSIUM CHLORIDE ER 10 MEQ PO TBCR
10.0000 meq | EXTENDED_RELEASE_TABLET | Freq: Two times a day (BID) | ORAL | 1 refills | Status: AC
  Filled 2024-05-17: qty 180, 90d supply, fill #0
  Filled 2024-08-25: qty 180, 90d supply, fill #1

## 2024-05-17 MED ORDER — FUROSEMIDE 40 MG PO TABS
40.0000 mg | ORAL_TABLET | Freq: Every day | ORAL | 1 refills | Status: AC
  Filled 2024-05-17: qty 90, 90d supply, fill #0

## 2024-05-17 MED ORDER — VALSARTAN 40 MG PO TABS
20.0000 mg | ORAL_TABLET | Freq: Every day | ORAL | 3 refills | Status: AC
  Filled 2024-05-17: qty 45, 90d supply, fill #0
  Filled 2024-07-29 – 2024-08-01 (×2): qty 45, 90d supply, fill #1

## 2024-05-17 MED ORDER — RYBELSUS 14 MG PO TABS
14.0000 mg | ORAL_TABLET | Freq: Every day | ORAL | 5 refills | Status: AC
  Filled 2024-05-17: qty 30, 30d supply, fill #0

## 2024-05-17 MED ORDER — GLIPIZIDE 5 MG PO TABS
5.0000 mg | ORAL_TABLET | Freq: Two times a day (BID) | ORAL | 1 refills | Status: AC
  Filled 2024-05-17 – 2024-08-05 (×2): qty 180, 90d supply, fill #0

## 2024-05-17 NOTE — Patient Instructions (Signed)
  VISIT SUMMARY: Today, we reviewed your chronic medical conditions, including diabetes, hypertension, heart disease, and congestive heart failure. We discussed your current medications, recent lab results, and lifestyle changes. We also talked about your screenings and vaccinations.  YOUR PLAN: -TYPE 2 DIABETES MELLITUS: Your A1c level is 8.0, which is above the target of less than 7. We will increase your Rybelsus  dose to 14 mg daily. Please be aware of potential side effects like abdominal pain, vomiting, severe diarrhea, and heart racing. Continue working on improving your diet and aim to exercise 3-4 days a week.  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 We will switch your medication from Entresto  to valsartan  due to cost. Please monitor your blood pressure regularly, aiming for a goal of 130/80 or lower. We will also recheck your kidney function with lab tests.  -HYPERTENSION: Your blood pressure readings are slightly above the target of 130/80. Please monitor your blood pressure regularly to help manage this condition.  -ATHEROSCLEROTIC HEART DISEASE OF NATIVE CORONARY ARTERY: This condition involves the narrowing of your heart's arteries. Continue taking atorvastatin  and aspirin  as prescribed.  -CHRONIC KIDNEY DISEASE, UNSPECIFIED STAGE: We need to monitor your kidney function due to previous declines. Lab tests will be ordered to recheck your kidney function.  -GENERAL HEALTH MAINTENANCE: We discussed the importance of regular screenings and vaccinations. You declined the flu vaccine. Your eye appointment has been rescheduled to January. We will submit a referral to a gastroenterologist for a colonoscopy and encourage you to seek support for transportation and assistance for this procedure.  INSTRUCTIONS: Please follow up with the lab tests for your kidney function and monitor your blood  pressure regularly. We will also submit a referral for a colonoscopy. Aim to improve your diet and exercise routine, and be aware of the side effects of your increased Rybelsus  dose. Your next eye appointment is in January.                      Contains text generated by Abridge.                                 Contains text generated by Abridge.

## 2024-05-17 NOTE — Progress Notes (Signed)
 Patient ID: Tracey Gallegos, female    DOB: 1956-03-17  MRN: [id number]  CC: Diabetes (DM f/u. Med refills. /Intermittent soreness of legs, lack of circulation X6 - 12 mo /Reports not taking Entresto  due to cost/No to flu vax. No to stool kit. Waiting for a ride for colonoscopy procedure.)   Subjective: Tracey Gallegos is a 68 y.o. female who presents for chronic ds management. Her concerns today include:  Patient with history of HTN, DM type II, CAD with DES to LT CXM, Diastolic and systolic CHF with EF of 50-55%, moderate pulmonary hypertension on echo 04/2018, morbid obesity, silent lacunar infarcts on MRI 12/2021, cervical DDD, tension headache   Discussed the use of AI scribe software for clinical note transcription with the patient, who gave verbal consent to proceed.  History of Present Illness Tracey Gallegos is a 68 year old female with diabetes, hypertension, heart disease, and congestive heart failure who presents for follow-up of her chronic medical conditions.  DM: Results for orders placed or performed in visit on 05/17/24  POCT glucose (manual entry)   Collection Time: 05/17/24  2:48 PM  Result Value Ref Range   POC Glucose 228 (A) 70 - 99 mg/dl  POCT glycosylated hemoglobin (Hb A1C)   Collection Time: 05/17/24  2:50 PM  Result Value Ref Range   Hemoglobin A1C     HbA1c POC (<> result, manual entry)     HbA1c, POC (prediabetic range)     HbA1c, POC (controlled diabetic range) 8.0 (A) 0.0 - 7.0 %  Her A1c was 8.2 in April and is currently 8.0, with a target of less than 7. She continues on glipizide  5 mg twice daily and Rybelsus  7 mg daily. She does not regularly check her blood sugars despite having the device. She reports eating out and consuming fast food less often, is trying to eat healthier, has joined a gym but has not started going yet, and is trying to walk more, but has not established a routine. Her goal is to try walk several mornings a wk. Tracey Gallegos has remained the same since  last visit in April.  HTN/CAD/CHF: Regarding hypertension, heart disease, and congestive heart failure, she experiences leg swelling, particularly in the evenings. No chest pain or shortness of breath. Current medications include metoprolol  50 mg daily, furosemide  40 mg daily, amlodipine  10 mg daily, and atorvastatin . She has not taken Entresto  for the past four to five months and reports she may have missed the patient assistance program deadline. She does not regularly check her blood pressure but limits salt intake. Noted to have some renal insuff on labs done in April with GFR 51; usually in the 60s.  She reports intermittent soreness in her legs over the past six to twelve months, noticeable when applying lotion or rubbing her legs, but no pain when walking or at nights.  HM: She has not completed her colon cancer screening, having discarded the Cologuard kit previously sent. She faces logistical challenges with transportation for a colonoscopy but prefers to doc-scope. Her eye appointment for diabetes-related screening was postponed to January. 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   Dyslipidemia, goal LDL below 70 12/09/2019   Papanicolaou smear of cervix with low risk human papillomavirus (HPV) DNA test positive 01/21/2019   Influenza vaccination declined 07/20/2018  Chronic combined systolic and diastolic CHF (congestive heart failure) (HCC) 07/20/2018   Positive depression screening 07/20/2018   CAD (coronary artery disease) 07/09/2018   Type 2 diabetes mellitus without complication, without long-term current use of insulin  (HCC) 06/24/2018   Morbid obesity (HCC)      Current Outpatient Medications on File Prior to Visit  Medication Sig Dispense Refill   Accu-Chek Softclix Lancets lancets Use to check  blood sugar daily. E11.9 100 each 2   aspirin  (ASPIRIN  81) 81 MG EC tablet Take 1 tablet (81 mg total) by mouth daily. 100 tablet 2   Blood Glucose Monitoring Suppl (ACCU-CHEK GUIDE ME) w/Device KIT Use to check blood sugar daily. E11.9 1 kit 0   butalbital -acetaminophen -caffeine  (FIORICET ) 50-325-40 MG tablet Take 1-2 tablets by mouth 2 (two) times daily as needed for headache. 20 tablet 0   diclofenac  Sodium (VOLTAREN ) 1 % GEL Apply 2 g topically 4 (four) times daily. 100 g 1   Multiple Vitamins-Minerals (MULTIVITAMIN WITH MINERALS) tablet Take 1 tablet by mouth daily.     Omega-3 Fatty Acids (FISH OIL PO) Take 1,000 mg by mouth daily.      No current facility-administered medications on file prior to visit.    Allergies  Allergen Reactions   Lisinopril  Cough    Social History   Socioeconomic History   Marital status: Single    Spouse name: Not on file   Number of children: 0   Years of education: Not on file   Highest education level: Master's degree (e.g., MA, MS, MEng, MEd, MSW, MBA)  Occupational History   Occupation: retired  Tobacco Use   Smoking status: Never   Smokeless tobacco: Never  Vaping Use   Vaping status: Never Used  Substance and Sexual Activity   Alcohol use: Yes    Comment: occasionally   Drug use: Never   Sexual activity: Not Currently  Other Topics Concern   Not on file  Social History Narrative   Right handed   Caffeine  Intake 1 cup of coffee occassionally   Social Drivers of Corporate Investment Banker Strain: Low Risk  (11/17/2023)   Overall Financial Resource Strain (CARDIA)    Difficulty of Paying Living Expenses: Not hard at all  Food Insecurity: No Food Insecurity (11/17/2023)   Hunger Vital Sign    Worried About Running Out of Food in the Last Year: Never true    Ran Out of Food in the Last Year: Never true  Transportation Needs: No Transportation Needs (11/17/2023)   PRAPARE - Administrator, Civil Service (Medical): No     Lack of Transportation (Non-Medical): No  Physical Activity: Insufficiently Active (11/17/2023)   Exercise Vital Sign    Days of Exercise per Week: 1 day    Minutes of Exercise per Session: 20 min  Stress: No Stress Concern Present (11/17/2023)   [employer] of Occupational Health - Occupational Stress Questionnaire    Feeling of Stress : Only a little  Social Connections: Moderately Isolated (11/17/2023)   Social Connection and Isolation Panel    Frequency of Communication with Friends and Family: Twice a week    Frequency of Social Gatherings with Friends and Family: Once a week    Attends Religious Services: 1 to 4 times per year    Active Member of [employer] or Organizations: No    Attends Banker Meetings: Never    Marital Status: Never married  Intimate Partner Violence: Not At Risk (02/24/2023)  Humiliation, Afraid, Rape, and Kick questionnaire    Fear of Current or Ex-Partner: No    Emotionally Abused: No    Physically Abused: No    Sexually Abused: No    Family History  Problem Relation Age of Onset   Diabetes Mother    Leukemia Mother    Colon cancer Brother    Breast cancer Neg Hx     Past Surgical History:  Procedure Laterality Date   CARDIAC CATHETERIZATION  06/28/2018   CORONARY STENT INTERVENTION N/A 06/28/2018   Procedure: CORONARY STENT INTERVENTION;  Surgeon: Anner Alm ORN, MD;  Location: [facility] INVASIVE CV LAB;  Service: Cardiovascular;  Laterality: N/A;   CORONARY STENT PLACEMENT  06/28/2018   RIGHT/LEFT HEART CATH AND CORONARY ANGIOGRAPHY N/A 06/28/2018   Procedure: RIGHT/LEFT HEART CATH AND CORONARY ANGIOGRAPHY;  Surgeon: Anner Alm ORN, MD;  Location: [facility] INVASIVE CV LAB;  Service: Cardiovascular;  Laterality: N/A;   TOTAL KNEE ARTHROPLASTY Left 05/2016    ROS: Review of Systems Negative except as stated above  PHYSICAL EXAM: BP 139/83   Pulse 88   Temp 97.9 F (36.6 C) (Oral)   Ht 5' 8 (1.727 m)   Wt 296 lb (134.3 kg)   SpO2 94%    BMI 45.01 kg/m   Wt Readings from Last 3 Encounters:  05/17/24 296 lb (134.3 kg)  11/17/23 296 lb 6.4 oz (134.4 kg)  02/24/23 289 lb (131.1 kg)    Physical Exam  General appearance - alert, well appearing, and in no distress Mental status - normal mood, behavior, speech, dress, motor activity, and thought processes Neck - supple, no significant adenopathy Chest - clear to auscultation, no wheezes, rales or rhonchi, symmetric air entry Heart - normal rate, regular rhythm, normal S1, S2, no murmurs, rubs, clicks or gallops Extremities - trace BL LE edema      Latest Ref Rng & Units 11/17/2023    2:53 PM 04/25/2022    2:26 PM 04/22/2022   10:54 AM  CMP  Glucose 70 - 99 mg/dL 884  832    BUN 8 - 27 mg/dL 17  15    Creatinine 9.42 - 1.00 mg/dL 8.82  9.01    Sodium 865 - 144 mmol/L 142  143    Potassium 3.5 - 5.2 mmol/L 4.3  4.7    Chloride 96 - 106 mmol/L 101  103    CO2 20 - 29 mmol/L 23  25    Calcium  8.7 - 10.3 mg/dL 89.9  89.9    Total Protein 6.0 - 8.5 g/dL 7.9   6.9   Total Bilirubin 0.0 - 1.2 mg/dL 0.6   0.5   Alkaline Phos 44 - 121 IU/L 127   121   AST 0 - 40 IU/L 31   25   ALT 0 - 32 IU/L 21   30    Lipid Panel     Component Value Date/Time   CHOL 168 11/17/2023 1453   TRIG 92 11/17/2023 1453   HDL 57 11/17/2023 1453   CHOLHDL 2.9 11/17/2023 1453   CHOLHDL 5.5 04/27/2018 1513   VLDL 17 04/27/2018 1513   LDLCALC 94 11/17/2023 1453    CBC    Component Value Date/Time   WBC 6.5 11/17/2023 1453   WBC 5.4 06/30/2018 0401   RBC 5.39 (H) 11/17/2023 1453   RBC 5.42 (H) 06/30/2018 0401   HGB 15.0 11/17/2023 1453   HCT 45.7 11/17/2023 1453   PLT 249 11/17/2023 1453   MCV  85 11/17/2023 1453   MCH 27.8 11/17/2023 1453   MCH 24.7 (L) 06/30/2018 0401   MCHC 32.8 11/17/2023 1453   MCHC 30.3 06/30/2018 0401   RDW 13.8 11/17/2023 1453   LYMPHSABS 1.7 07/09/2018 0937   MONOABS 0.5 06/24/2018 1305   EOSABS 0.6 (H) 07/09/2018 0937   BASOSABS 0.1 07/09/2018 0937     ASSESSMENT AND PLAN: 1. Type 2 diabetes mellitus with morbid obesity (HCC) (Primary) Not at goal. Commended her on changes in eating habits that she has instituted so far.  Encouraged her in her endeavors to walk more. We discussed increasing the Rybelsus  from 7 mg to 14 mg daily.  Patient agreeable to this.  I went over potential side effects to watch out for with the increased dose including vomiting, abdominal pain, severe diarrhea or palpitations.  Should any of these occur she should stop the medicine and let me know.  Continue glipizide . - POCT glucose (manual entry) - POCT glycosylated hemoglobin (Hb A1C) - glipiZIDE  (GLUCOTROL ) 5 MG tablet; Take 1 tablet (5 mg total) by mouth 2 (two) times daily before a meal.  Dispense: 180 tablet; Refill: 1 - Semaglutide  (RYBELSUS ) 14 MG TABS; Take 1 tablet (14 mg total) by mouth daily.  Dispense: 30 tablet; Refill: 5 - Basic Metabolic Panel  2. Diabetes mellitus treated with oral medication (HCC) See #1 above.  3. Hypertension associated with type 2 diabetes mellitus (HCC) Not at goal. Continue amlodipine  10 mg daily, Toprol -XL 50 mg daily. Entresto  removed from list since she has not pursued getting re-enrolled in PAP program for it. Add Valsartan  instead. - amLODipine  (NORVASC ) 10 MG tablet; Take 1 tablet (10 mg total) by mouth daily.  Dispense: 90 tablet; Refill: 1 - metoprolol  succinate (TOPROL -XL) 50 MG 24 hr tablet; Take 1 tablet (50 mg total) by mouth daily.  Dispense: 90 tablet; Refill: 1 - valsartan  (DIOVAN ) 40 MG tablet; Take 0.5 tablets (20 mg total) by mouth daily.  Dispense: 45 tablet; Refill: 3  4. Chronic combined systolic and diastolic CHF (congestive heart failure) (HCC) Stable and compensated with some lower extremity edema for which amlodipine  may be contributing.  Continue metoprolol , furosemide , potassium. Since she is not able to afford the Entresto  and not wanting to go through PAP again, will add Valsartan  40 mg 1/2 tab  daily insteady. - furosemide  (LASIX ) 40 MG tablet; Take 1 tablet (40 mg total) by mouth daily.  Dispense: 90 tablet; Refill: 1 - metoprolol  succinate (TOPROL -XL) 50 MG 24 hr tablet; Take 1 tablet (50 mg total) by mouth daily.  Dispense: 90 tablet; Refill: 1 - potassium chloride  (KLOR-CON ) 10 MEQ tablet; Take 1 tablet (10 mEq total) by mouth 2 (two) times daily.  Dispense: 180 tablet; Refill: 1  5. Coronary artery disease involving native coronary artery of native heart without angina pectoris Stable. Continue metoprolol , atorvastatin  80 mg daily, aspirin  81 mg daily  - metoprolol  succinate (TOPROL -XL) 50 MG 24 hr tablet; Take 1 tablet (50 mg total) by mouth daily.  Dispense: 90 tablet; Refill: 1  6. Renal insufficiency Recheck BMP today. Not on NSAIDs  7. Influenza vaccination declined Recommended. Pt declined  8. Screening for colon cancer - Ambulatory referral to Gastroenterology    Patient was given the opportunity to ask questions.  Patient verbalized understanding of the plan and was able to repeat key elements of the plan.   This documentation was completed using Paediatric nurse.  Any transcriptional errors are unintentional.  Orders Placed This Encounter  Procedures   Basic Metabolic Panel   Ambulatory referral to Gastroenterology   POCT glucose (manual entry)   POCT glycosylated hemoglobin (Hb A1C)     Requested Prescriptions   Signed Prescriptions Disp Refills   amLODipine  (NORVASC ) 10 MG tablet 90 tablet 1    Sig: Take 1 tablet (10 mg total) by mouth daily.   atorvastatin  (LIPITOR) 80 MG tablet 90 tablet 1    Sig: Take 1 tablet (80 mg total) by mouth daily.   furosemide  (LASIX ) 40 MG tablet 90 tablet 1    Sig: Take 1 tablet (40 mg total) by mouth daily.   glipiZIDE  (GLUCOTROL ) 5 MG tablet 180 tablet 1    Sig: Take 1 tablet (5 mg total) by mouth 2 (two) times daily before a meal.   metoprolol  succinate (TOPROL -XL) 50 MG 24 hr tablet 90 tablet 1     Sig: Take 1 tablet (50 mg total) by mouth daily.   potassium chloride  (KLOR-CON ) 10 MEQ tablet 180 tablet 1    Sig: Take 1 tablet (10 mEq total) by mouth 2 (two) times daily.   Semaglutide  (RYBELSUS ) 14 MG TABS 30 tablet 5    Sig: Take 1 tablet (14 mg total) by mouth daily.   valsartan  (DIOVAN ) 40 MG tablet 45 tablet 3    Sig: Take 0.5 tablets (20 mg total) by mouth daily.    Return in about 4 months (around 09/17/2024) for Cancel appt with me next month.  Barnie Louder, MD, FACP

## 2024-05-18 ENCOUNTER — Ambulatory Visit: Payer: Self-pay | Admitting: Internal Medicine

## 2024-05-18 ENCOUNTER — Other Ambulatory Visit: Payer: Self-pay

## 2024-05-18 LAB — BASIC METABOLIC PANEL WITH GFR
BUN/Creatinine Ratio: 19 (ref 12–28)
BUN: 22 mg/dL (ref 8–27)
CO2: 26 mmol/L (ref 20–29)
Calcium: 10.1 mg/dL (ref 8.7–10.3)
Chloride: 100 mmol/L (ref 96–106)
Creatinine, Ser: 1.15 mg/dL — ABNORMAL HIGH (ref 0.57–1.00)
Glucose: 217 mg/dL — ABNORMAL HIGH (ref 70–99)
Potassium: 4.6 mmol/L (ref 3.5–5.2)
Sodium: 142 mmol/L (ref 134–144)
eGFR: 52 mL/min/1.73 — ABNORMAL LOW (ref >59)

## 2024-05-27 ENCOUNTER — Encounter: Admitting: Internal Medicine

## 2024-06-05 DIAGNOSIS — I509 Heart failure, unspecified: Secondary | ICD-10-CM | POA: Diagnosis not present

## 2024-06-05 DIAGNOSIS — Z6841 Body Mass Index (BMI) 40.0 and over, adult: Secondary | ICD-10-CM | POA: Diagnosis not present

## 2024-06-05 DIAGNOSIS — I110 Hypertensive heart disease with heart failure: Secondary | ICD-10-CM | POA: Diagnosis not present

## 2024-06-05 DIAGNOSIS — I2510 Atherosclerotic heart disease of native coronary artery without angina pectoris: Secondary | ICD-10-CM | POA: Diagnosis not present

## 2024-06-05 DIAGNOSIS — E876 Hypokalemia: Secondary | ICD-10-CM | POA: Diagnosis not present

## 2024-06-05 DIAGNOSIS — Z809 Family history of malignant neoplasm, unspecified: Secondary | ICD-10-CM | POA: Diagnosis not present

## 2024-06-05 DIAGNOSIS — Z8249 Family history of ischemic heart disease and other diseases of the circulatory system: Secondary | ICD-10-CM | POA: Diagnosis not present

## 2024-06-05 DIAGNOSIS — Z833 Family history of diabetes mellitus: Secondary | ICD-10-CM | POA: Diagnosis not present

## 2024-06-05 DIAGNOSIS — E785 Hyperlipidemia, unspecified: Secondary | ICD-10-CM | POA: Diagnosis not present

## 2024-06-05 DIAGNOSIS — E119 Type 2 diabetes mellitus without complications: Secondary | ICD-10-CM | POA: Diagnosis not present

## 2024-06-14 ENCOUNTER — Other Ambulatory Visit: Payer: Self-pay

## 2024-06-20 ENCOUNTER — Other Ambulatory Visit: Payer: Self-pay

## 2024-06-20 ENCOUNTER — Other Ambulatory Visit: Payer: Self-pay | Admitting: Internal Medicine

## 2024-06-20 DIAGNOSIS — M542 Cervicalgia: Secondary | ICD-10-CM

## 2024-06-20 MED ORDER — DICLOFENAC SODIUM 1 % EX GEL
2.0000 g | Freq: Four times a day (QID) | CUTANEOUS | 1 refills | Status: AC
  Filled 2024-06-20: qty 100, 13d supply, fill #0

## 2024-06-22 ENCOUNTER — Other Ambulatory Visit: Payer: Self-pay

## 2024-06-29 ENCOUNTER — Other Ambulatory Visit: Payer: Self-pay | Admitting: Pharmacist

## 2024-06-29 NOTE — Progress Notes (Signed)
 Pharmacy Quality Measure Review  This patient is appearing on a report for the adherence measure for cholesterol (statin) medications this calendar year.   Medication: atorvastatin  Last fill date: 06/22/24 for 90 day supply  Insurance report was not up to date. No action needed at this time.   Herlene Fleeta Morris, PharmD, JAQUELINE, CPP Clinical Pharmacist [facility] [phone]

## 2024-07-12 ENCOUNTER — Ambulatory Visit

## 2024-07-18 ENCOUNTER — Ambulatory Visit

## 2024-07-18 ENCOUNTER — Other Ambulatory Visit: Payer: Self-pay | Admitting: Pharmacist

## 2024-07-18 ENCOUNTER — Other Ambulatory Visit: Payer: Self-pay

## 2024-07-18 VITALS — Ht 68.0 in | Wt 297.0 lb

## 2024-07-18 DIAGNOSIS — Z1211 Encounter for screening for malignant neoplasm of colon: Secondary | ICD-10-CM

## 2024-07-18 DIAGNOSIS — Z0000 Encounter for general adult medical examination without abnormal findings: Secondary | ICD-10-CM | POA: Diagnosis not present

## 2024-07-18 MED ORDER — ACCU-CHEK GUIDE W/DEVICE KIT
PACK | 0 refills | Status: AC
  Filled 2024-07-18: qty 1, 30d supply, fill #0

## 2024-07-18 MED ORDER — ACCU-CHEK SOFTCLIX LANCETS MISC
6 refills | Status: AC
  Filled 2024-07-18: qty 100, 100d supply, fill #0

## 2024-07-18 MED ORDER — ACCU-CHEK GUIDE TEST VI STRP
ORAL_STRIP | 6 refills | Status: AC
  Filled 2024-07-18: qty 100, 100d supply, fill #0

## 2024-07-18 NOTE — Patient Instructions (Signed)
 Ms. Kofoed,  Thank you for taking the time for your Medicare Wellness Visit. I appreciate your continued commitment to your health goals. Please review the care plan we discussed, and feel free to reach out if I can assist you further.  Please note that Annual Wellness Visits do not include a physical exam. Some assessments may be limited, especially if the visit was conducted virtually. If needed, we may recommend an in-person follow-up with your provider.  Ongoing Care Seeing your primary care provider every 3 to 6 months helps us  monitor your health and provide consistent, personalized care.   Referrals If a referral was made during today's visit and you haven't received any updates within two weeks, please contact the referred provider directly to check on the status.  Recommended Screenings:  Health Maintenance  Topic Date Due   Pap Smear  09/18/2021   Complete foot exam   01/26/2024   Colon Cancer Screening  02/02/2024   Medicare Annual Wellness Visit  02/24/2024   COVID-19 Vaccine (3 - 2025-26 season) 03/21/2024   Eye exam for diabetics  05/05/2024   Flu Shot  10/18/2024*   Zoster (Shingles) Vaccine (1 of 2) 05/17/2025*   Hemoglobin A1C  11/15/2024   Yearly kidney health urinalysis for diabetes  11/16/2024   Pneumococcal Vaccine for age over 40 (3 of 3 - PCV20 or PCV21) 01/30/2025   Yearly kidney function blood test for diabetes  05/17/2025   Breast Cancer Screening  05/13/2026   DTaP/Tdap/Td vaccine (2 - Td or Tdap) 07/20/2028   Osteoporosis screening with Bone Density Scan  Completed   Hepatitis C Screening  Completed   Meningitis B Vaccine  Aged Out  *Topic was postponed. The date shown is not the original due date.       07/18/2024    4:25 PM  Advanced Directives  Does Patient Have a Medical Advance Directive? No  Would patient like information on creating a medical advance directive? No - Patient declined    Vision: Annual vision screenings are recommended for  early detection of glaucoma, cataracts, and diabetic retinopathy. These exams can also reveal signs of chronic conditions such as diabetes and high blood pressure.  Dental: Annual dental screenings help detect early signs of oral cancer, gum disease, and other conditions linked to overall health, including heart disease and diabetes.  Please see the attached documents for additional preventive care recommendations.

## 2024-07-18 NOTE — Progress Notes (Signed)
 "  Chief Complaint  Patient presents with   Medicare Wellness    SUBSEQUENT     Subjective:   Tracey Gallegos is a 68 y.o. female who presents for a Medicare Annual Wellness Visit.  Visit info / Clinical Intake: Medicare Wellness Visit Type:: Subsequent Annual Wellness Visit Persons participating in visit and providing information:: patient Medicare Wellness Visit Mode:: Telephone If telephone:: video declined Since this visit was completed virtually, some vitals may be partially provided or unavailable. Missing vitals are due to the limitations of the virtual format.: Documented vitals are patient reported If Telephone or Video please confirm:: I connected with patient using audio/video enable telemedicine. I verified patient identity with two identifiers, discussed telehealth limitations, and patient agreed to proceed. Patient Location:: HOME Provider Location:: HOME OFFICE Interpreter Needed?: No Pre-visit prep was completed: yes AWV questionnaire completed by patient prior to visit?: no Living arrangements:: (!) lives alone Patient's Overall Health Status Rating: very good Typical amount of pain: none Does pain affect daily life?: no Are you currently prescribed opioids?: no  Dietary Habits and Nutritional Risks How many meals a day?: 2 Eats fruit and vegetables daily?: yes Most meals are obtained by: preparing own meals In the last 2 weeks, have you had any of the following?: none Diabetic:: (!) yes Any non-healing wounds?: no How often do you check your BS?: 0 (NEED A NEW GLUCOMETER) Would you like to be referred to a Nutritionist or for Diabetic Management? : no  Functional Status Activities of Daily Living (to include ambulation/medication): Independent Ambulation: Independent Medication Administration: Independent Home Management (perform basic housework or laundry): Independent Manage your own finances?: yes Primary transportation is: driving Concerns about vision?:  no *vision screening is required for WTM* Concerns about hearing?: no  Fall Screening Falls in the past year?: 0 Number of falls in past year: 0 Was there an injury with Fall?: 0 Fall Risk Category Calculator: 0 Patient Fall Risk Level: Low Fall Risk  Fall Risk Patient at Risk for Falls Due to: No Fall Risks Fall risk Follow up: Falls evaluation completed; Education provided  Home and Transportation Safety: All rugs have non-skid backing?: N/A, no rugs All stairs or steps have railings?: N/A, no stairs Grab bars in the bathtub or shower?: yes (SHOWER) Have non-skid surface in bathtub or shower?: yes Good home lighting?: yes Regular seat belt use?: yes Hospital stays in the last year:: no  Cognitive Assessment Difficulty concentrating, remembering, or making decisions? : no Will 6CIT or Mini Cog be Completed: yes What year is it?: 0 points What month is it?: 0 points Give patient an address phrase to remember (5 components): [address] About what time is it?: 0 points Count backwards from 20 to 1: 0 points Say the months of the year in reverse: 0 points Repeat the address phrase from earlier: 0 points 6 CIT Score: 0 points  Advance Directives (For Healthcare) Does Patient Have a Medical Advance Directive?: No Would patient like information on creating a medical advance directive?: No - Patient declined  Reviewed/Updated  Reviewed/Updated: Reviewed All (Medical, Surgical, Family, Medications, Allergies, Care Teams, Patient Goals)    Allergies (verified) Lisinopril    Current Medications (verified) Outpatient Encounter Medications as of 07/18/2024  Medication Sig   amLODipine  (NORVASC ) 10 MG tablet Take 1 tablet (10 mg total) by mouth daily.   aspirin  (ASPIRIN  81) 81 MG EC tablet Take 1 tablet (81 mg total) by mouth daily.   atorvastatin  (LIPITOR) 80 MG tablet  Take 1 tablet (80 mg total) by mouth daily.   butalbital -acetaminophen -caffeine  (FIORICET )  50-325-40 MG tablet Take 1-2 tablets by mouth 2 (two) times daily as needed for headache.   diclofenac  Sodium (VOLTAREN ) 1 % GEL Apply 2 grams topically 4 (four) times daily.   furosemide  (LASIX ) 40 MG tablet Take 1 tablet (40 mg total) by mouth daily.   glipiZIDE  (GLUCOTROL ) 5 MG tablet Take 1 tablet (5 mg total) by mouth 2 (two) times daily before a meal.   metoprolol  succinate (TOPROL -XL) 50 MG 24 hr tablet Take 1 tablet (50 mg total) by mouth daily.   Multiple Vitamins-Minerals (MULTIVITAMIN WITH MINERALS) tablet Take 1 tablet by mouth daily.   Omega-3 Fatty Acids (FISH OIL PO) Take 1,000 mg by mouth daily.    potassium chloride  (KLOR-CON ) 10 MEQ tablet Take 1 tablet (10 mEq total) by mouth 2 (two) times daily.   Semaglutide  (RYBELSUS ) 14 MG TABS Take 1 tablet (14 mg total) by mouth daily.   valsartan  (DIOVAN ) 40 MG tablet Take 0.5 tablets (20 mg total) by mouth daily.   [DISCONTINUED] Accu-Chek Softclix Lancets lancets Use to check blood sugar daily. E11.9   [DISCONTINUED] Blood Glucose Monitoring Suppl (ACCU-CHEK GUIDE ME) w/Device KIT Use to check blood sugar daily. E11.9   No facility-administered encounter medications on file as of 07/18/2024.    History: Past Medical History:  Diagnosis Date   CHF (congestive heart failure) (HCC)    Coronary artery disease    Diabetes mellitus without complication (HCC)    Dyspnea    Hypertension    Past Surgical History:  Procedure Laterality Date   CARDIAC CATHETERIZATION  06/28/2018   CORONARY STENT INTERVENTION N/A 06/28/2018   Procedure: CORONARY STENT INTERVENTION;  Surgeon: Anner Alm ORN, MD;  Location: [facility] INVASIVE CV LAB;  Service: Cardiovascular;  Laterality: N/A;   CORONARY STENT PLACEMENT  06/28/2018   RIGHT/LEFT HEART CATH AND CORONARY ANGIOGRAPHY N/A 06/28/2018   Procedure: RIGHT/LEFT HEART CATH AND CORONARY ANGIOGRAPHY;  Surgeon: Anner Alm ORN, MD;  Location: [facility] INVASIVE CV LAB;  Service: Cardiovascular;  Laterality: N/A;    TOTAL KNEE ARTHROPLASTY Left 05/2016   Family History  Problem Relation Age of Onset   Diabetes Mother    Leukemia Mother    Colon cancer Brother    Breast cancer Neg Hx    Social History   Occupational History   Occupation: retired  Tobacco Use   Smoking status: Never   Smokeless tobacco: Never  Vaping Use   Vaping status: Never Used  Substance and Sexual Activity   Alcohol use: Yes    Comment: occasionally   Drug use: Never   Sexual activity: Not Currently   Tobacco Counseling Counseling given: Not Answered  SDOH Screenings   Food Insecurity: No Food Insecurity (07/18/2024)  Housing: Low Risk (07/18/2024)  Transportation Needs: No Transportation Needs (07/18/2024)  Utilities: Not At Risk (07/18/2024)  Alcohol Screen: Low Risk (07/18/2024)  Depression (PHQ2-9): Low Risk (07/18/2024)  Financial Resource Strain: Low Risk (07/18/2024)  Physical Activity: Insufficiently Active (07/18/2024)  Social Connections: Moderately Isolated (07/18/2024)  Stress: No Stress Concern Present (07/18/2024)  Tobacco Use: Low Risk (07/18/2024)  Health Literacy: Adequate Health Literacy (07/18/2024)   See flowsheets for full screening details  Depression Screen Depression Screening Exception Documentation Depression Screening Exception:: Patient refusal  PHQ 2 & 9 Depression Scale- Over the past 2 weeks, how often have you been bothered by any of the following problems? Little interest or pleasure in doing things: 0 Feeling  down, depressed, or hopeless (PHQ Adolescent also includes...irritable): 0 PHQ-2 Total Score: 0 Trouble falling or staying asleep, or sleeping too much: 0 Feeling tired or having little energy: 0 Poor appetite or overeating (PHQ Adolescent also includes...weight loss): 0 Feeling bad about yourself - or that you are a failure or have let yourself or your family down: 0 Trouble concentrating on things, such as reading the newspaper or watching television (PHQ  Adolescent also includes...like school work): 0 Moving or speaking so slowly that other people could have noticed. Or the opposite - being so fidgety or restless that you have been moving around a lot more than usual: 0 Thoughts that you would be better off dead, or of hurting yourself in some way: 0 PHQ-9 Total Score: 0 If you checked off any problems, how difficult have these problems made it for you to do your work, take care of things at home, or get along with other people?: Not difficult at all  Depression Treatment Depression Interventions/Treatment : EYV7-0 Score <4 Follow-up Not Indicated     Goals Addressed             This Visit's Progress    07/18/24: To increase my physical activity to get more energy.               Objective:    Today's Vitals   07/18/24 1623  Weight: 297 lb (134.7 kg)  Height: 5' 8 (1.727 m)  PainSc: 0-No pain   Body mass index is 45.16 kg/m.  Hearing/Vision screen No results found. Immunizations and Health Maintenance Health Maintenance  Topic Date Due   Cervical Cancer Screening (Pap smear)  09/18/2021   FOOT EXAM  01/26/2024   Colonoscopy  02/02/2024   COVID-19 Vaccine (3 - 2025-26 season) 03/21/2024   OPHTHALMOLOGY EXAM  05/05/2024   Influenza Vaccine  10/18/2024 (Originally 02/19/2024)   Zoster Vaccines- Shingrix (1 of 2) 05/17/2025 (Originally 12/21/2004)   HEMOGLOBIN A1C  11/15/2024   Diabetic kidney evaluation - Urine ACR  11/16/2024   Pneumococcal Vaccine: 50+ Years (3 of 3 - PCV20 or PCV21) 01/30/2025   Diabetic kidney evaluation - eGFR measurement  05/17/2025   Medicare Annual Wellness (AWV)  07/18/2025   Mammogram  05/13/2026   DTaP/Tdap/Td (2 - Td or Tdap) 07/20/2028   Bone Density Scan  Completed   Hepatitis C Screening  Completed   Meningococcal B Vaccine  Aged Out        Assessment/Plan:  This is a routine wellness examination for [facility].  Patient Care Team: Vicci Barnie NOVAK, MD as PCP - General (Internal  Medicine) Mona Vinie BROCKS, MD as PCP - Cardiology (Cardiology) Octavia Charleston, MD as Referring Physician (Ophthalmology) Fleeta Morris, Garnette CROME, RPH-CPP as Pharmacist (Pharmacist)  I have personally reviewed and noted the following in the patients chart:   Medical and social history Use of alcohol, tobacco or illicit drugs  Current medications and supplements including opioid prescriptions. Functional ability and status Nutritional status Physical activity Advanced directives List of other physicians Hospitalizations, surgeries, and ER visits in previous 12 months Vitals Screenings to include cognitive, depression, and falls Referrals and appointments  No orders of the defined types were placed in this encounter.  In addition, I have reviewed and discussed with patient certain preventive protocols, quality metrics, and best practice recommendations. A written personalized care plan for preventive services as well as general preventive health recommendations were provided to patient.   Roz LOISE Fuller, LPN   [id number]  Return in about 1 year (around 07/18/2025) for Medicare wellness.  After Visit Summary: (MyChart) Due to this being a telephonic visit, the after visit summary with patients personalized plan was offered to patient via MyChart   Nurse Notes: Patient aware of current care gaps.  Patient stated that her foot exam is done by PCP. Diabetic Eye Exam scheduled for January 2026.  HM Addressed: Referral sent to GI for colonoscopy  "

## 2024-07-20 ENCOUNTER — Other Ambulatory Visit: Payer: Self-pay

## 2024-07-29 ENCOUNTER — Other Ambulatory Visit: Payer: Self-pay | Admitting: Pharmacist

## 2024-07-29 ENCOUNTER — Other Ambulatory Visit: Payer: Self-pay

## 2024-07-29 MED ORDER — RYBELSUS 7 MG PO TABS
14.0000 mg | ORAL_TABLET | Freq: Every day | ORAL | 0 refills | Status: AC
  Filled 2024-07-29: qty 90, 45d supply, fill #0

## 2024-08-02 LAB — OPHTHALMOLOGY REPORT-SCANNED

## 2024-08-05 ENCOUNTER — Other Ambulatory Visit: Payer: Self-pay

## 2024-08-26 ENCOUNTER — Other Ambulatory Visit: Payer: Self-pay

## 2024-09-19 ENCOUNTER — Ambulatory Visit: Admitting: Internal Medicine
